# Patient Record
Sex: Female | Born: 1955 | Race: Black or African American | Hispanic: No | Smoking: Never smoker
Health system: Southern US, Community
[De-identification: ages and names within clinical notes are randomized; demographics above are authoritative.]

## PROBLEM LIST (undated history)

## (undated) DIAGNOSIS — E538 Deficiency of other specified B group vitamins: Secondary | ICD-10-CM

## (undated) DIAGNOSIS — G5600 Carpal tunnel syndrome, unspecified upper limb: Secondary | ICD-10-CM

## (undated) DIAGNOSIS — C801 Malignant (primary) neoplasm, unspecified: Secondary | ICD-10-CM

## (undated) DIAGNOSIS — E669 Obesity, unspecified: Secondary | ICD-10-CM

## (undated) DIAGNOSIS — M1711 Unilateral primary osteoarthritis, right knee: Secondary | ICD-10-CM

## (undated) DIAGNOSIS — E039 Hypothyroidism, unspecified: Secondary | ICD-10-CM

## (undated) DIAGNOSIS — E785 Hyperlipidemia, unspecified: Secondary | ICD-10-CM

## (undated) DIAGNOSIS — E119 Type 2 diabetes mellitus without complications: Secondary | ICD-10-CM

## (undated) DIAGNOSIS — R17 Unspecified jaundice: Secondary | ICD-10-CM

## (undated) DIAGNOSIS — I10 Essential (primary) hypertension: Secondary | ICD-10-CM

## (undated) DIAGNOSIS — M13139 Monoarthritis, not elsewhere classified, unspecified wrist: Secondary | ICD-10-CM

## (undated) HISTORY — DX: Monoarthritis, not elsewhere classified, unspecified wrist: M13.139

## (undated) HISTORY — PX: CHOLECYSTECTOMY: SHX55

## (undated) HISTORY — DX: Hyperlipidemia, unspecified: E78.5

## (undated) HISTORY — DX: Obesity, unspecified: E66.9

## (undated) HISTORY — DX: Carpal tunnel syndrome, unspecified upper limb: G56.00

## (undated) HISTORY — PX: TEAR DUCT PROBING: SHX793

## (undated) HISTORY — DX: Deficiency of other specified B group vitamins: E53.8

## (undated) HISTORY — DX: Unspecified jaundice: R17

## (undated) HISTORY — PX: WISDOM TOOTH EXTRACTION: SHX21

## (undated) HISTORY — PX: CARDIAC CATHETERIZATION: SHX172

## (undated) HISTORY — DX: Hypothyroidism, unspecified: E03.9

## (undated) HISTORY — DX: Essential (primary) hypertension: I10

---

## 1999-11-24 ENCOUNTER — Encounter: Admission: RE | Admit: 1999-11-24 | Discharge: 1999-12-08 | Payer: Self-pay | Admitting: Internal Medicine

## 1999-11-25 ENCOUNTER — Encounter: Payer: Self-pay | Admitting: Internal Medicine

## 1999-11-25 ENCOUNTER — Encounter: Admission: RE | Admit: 1999-11-25 | Discharge: 1999-11-25 | Payer: Self-pay | Admitting: Internal Medicine

## 1999-12-14 ENCOUNTER — Encounter: Payer: Self-pay | Admitting: Neurosurgery

## 1999-12-14 ENCOUNTER — Ambulatory Visit (HOSPITAL_COMMUNITY): Admission: RE | Admit: 1999-12-14 | Discharge: 1999-12-14 | Payer: Self-pay | Admitting: Neurosurgery

## 2000-01-02 ENCOUNTER — Encounter: Payer: Self-pay | Admitting: Neurosurgery

## 2000-01-02 ENCOUNTER — Ambulatory Visit (HOSPITAL_COMMUNITY): Admission: RE | Admit: 2000-01-02 | Discharge: 2000-01-02 | Payer: Self-pay | Admitting: Neurosurgery

## 2000-01-16 ENCOUNTER — Ambulatory Visit (HOSPITAL_COMMUNITY): Admission: RE | Admit: 2000-01-16 | Discharge: 2000-01-16 | Payer: Self-pay | Admitting: Neurosurgery

## 2000-01-16 ENCOUNTER — Encounter: Payer: Self-pay | Admitting: Neurosurgery

## 2000-10-24 ENCOUNTER — Encounter: Admission: RE | Admit: 2000-10-24 | Discharge: 2001-01-22 | Payer: Self-pay | Admitting: Orthopedic Surgery

## 2001-03-19 ENCOUNTER — Encounter: Payer: Self-pay | Admitting: Internal Medicine

## 2001-03-19 ENCOUNTER — Encounter: Admission: RE | Admit: 2001-03-19 | Discharge: 2001-03-19 | Payer: Self-pay | Admitting: Internal Medicine

## 2002-06-02 ENCOUNTER — Encounter: Payer: Self-pay | Admitting: Orthopedic Surgery

## 2002-06-02 ENCOUNTER — Encounter: Admission: RE | Admit: 2002-06-02 | Discharge: 2002-06-02 | Payer: Self-pay | Admitting: Orthopedic Surgery

## 2002-06-16 ENCOUNTER — Encounter: Admission: RE | Admit: 2002-06-16 | Discharge: 2002-06-16 | Payer: Self-pay | Admitting: Orthopedic Surgery

## 2002-06-16 ENCOUNTER — Encounter: Payer: Self-pay | Admitting: Orthopedic Surgery

## 2003-08-12 ENCOUNTER — Encounter: Admission: RE | Admit: 2003-08-12 | Discharge: 2003-08-12 | Payer: Self-pay | Admitting: Internal Medicine

## 2004-08-29 ENCOUNTER — Other Ambulatory Visit: Admission: RE | Admit: 2004-08-29 | Discharge: 2004-08-29 | Payer: Self-pay | Admitting: Internal Medicine

## 2005-01-11 ENCOUNTER — Encounter: Admission: RE | Admit: 2005-01-11 | Discharge: 2005-01-11 | Payer: Self-pay | Admitting: Internal Medicine

## 2005-08-24 ENCOUNTER — Encounter: Payer: Self-pay | Admitting: Family Medicine

## 2005-12-28 ENCOUNTER — Encounter: Admission: RE | Admit: 2005-12-28 | Discharge: 2005-12-28 | Payer: Self-pay | Admitting: Interventional Cardiology

## 2006-01-01 ENCOUNTER — Inpatient Hospital Stay (HOSPITAL_BASED_OUTPATIENT_CLINIC_OR_DEPARTMENT_OTHER): Admission: RE | Admit: 2006-01-01 | Discharge: 2006-01-01 | Payer: Self-pay | Admitting: Interventional Cardiology

## 2006-07-03 ENCOUNTER — Ambulatory Visit: Payer: Self-pay | Admitting: Family Medicine

## 2006-08-21 ENCOUNTER — Ambulatory Visit: Payer: Self-pay | Admitting: Family Medicine

## 2006-08-21 LAB — CONVERTED CEMR LAB: Vitamin B-12: 193 pg/mL — ABNORMAL LOW (ref 211–911)

## 2006-09-04 ENCOUNTER — Ambulatory Visit: Payer: Self-pay | Admitting: Family Medicine

## 2006-10-05 ENCOUNTER — Ambulatory Visit: Payer: Self-pay | Admitting: Family Medicine

## 2006-11-05 ENCOUNTER — Encounter: Payer: Self-pay | Admitting: Family Medicine

## 2006-11-05 DIAGNOSIS — G5600 Carpal tunnel syndrome, unspecified upper limb: Secondary | ICD-10-CM

## 2006-11-05 DIAGNOSIS — E669 Obesity, unspecified: Secondary | ICD-10-CM | POA: Insufficient documentation

## 2006-11-05 DIAGNOSIS — E538 Deficiency of other specified B group vitamins: Secondary | ICD-10-CM | POA: Insufficient documentation

## 2006-11-05 DIAGNOSIS — I10 Essential (primary) hypertension: Secondary | ICD-10-CM

## 2006-11-05 DIAGNOSIS — E039 Hypothyroidism, unspecified: Secondary | ICD-10-CM | POA: Insufficient documentation

## 2006-11-05 DIAGNOSIS — IMO0002 Reserved for concepts with insufficient information to code with codable children: Secondary | ICD-10-CM

## 2006-11-05 DIAGNOSIS — M1710 Unilateral primary osteoarthritis, unspecified knee: Secondary | ICD-10-CM | POA: Insufficient documentation

## 2006-11-06 ENCOUNTER — Ambulatory Visit: Payer: Self-pay | Admitting: Family Medicine

## 2006-12-07 ENCOUNTER — Ambulatory Visit: Payer: Self-pay | Admitting: Family Medicine

## 2007-01-21 ENCOUNTER — Ambulatory Visit: Payer: Self-pay | Admitting: Family Medicine

## 2007-04-12 ENCOUNTER — Ambulatory Visit: Payer: Self-pay | Admitting: Family Medicine

## 2007-08-16 ENCOUNTER — Encounter: Payer: Self-pay | Admitting: Family Medicine

## 2007-08-16 ENCOUNTER — Ambulatory Visit: Payer: Self-pay | Admitting: Family Medicine

## 2007-09-09 ENCOUNTER — Encounter: Payer: Self-pay | Admitting: Family Medicine

## 2007-09-09 ENCOUNTER — Other Ambulatory Visit: Admission: RE | Admit: 2007-09-09 | Discharge: 2007-09-09 | Payer: Self-pay | Admitting: Family Medicine

## 2007-09-09 ENCOUNTER — Ambulatory Visit: Payer: Self-pay | Admitting: Family Medicine

## 2007-09-09 DIAGNOSIS — R109 Unspecified abdominal pain: Secondary | ICD-10-CM

## 2007-09-09 LAB — CONVERTED CEMR LAB
Bilirubin Urine: NEGATIVE
Blood in Urine, dipstick: NEGATIVE
Glucose, Urine, Semiquant: NEGATIVE
Ketones, urine, test strip: NEGATIVE
Nitrite: NEGATIVE
WBC Urine, dipstick: NEGATIVE

## 2007-09-10 ENCOUNTER — Telehealth: Payer: Self-pay | Admitting: Family Medicine

## 2007-09-11 ENCOUNTER — Ambulatory Visit: Payer: Self-pay | Admitting: Family Medicine

## 2007-09-12 ENCOUNTER — Encounter: Payer: Self-pay | Admitting: Family Medicine

## 2007-09-16 DIAGNOSIS — N83209 Unspecified ovarian cyst, unspecified side: Secondary | ICD-10-CM

## 2007-09-16 LAB — CONVERTED CEMR LAB
Albumin: 3.8 g/dL (ref 3.5–5.2)
CO2: 29 meq/L (ref 19–32)
Calcium: 9.4 mg/dL (ref 8.4–10.5)
Creatinine, Ser: 0.8 mg/dL (ref 0.4–1.2)
HDL: 45.8 mg/dL (ref >39.0)
Potassium: 4.2 meq/L (ref 3.5–5.1)
Sodium: 140 meq/L (ref 135–145)
Total CHOL/HDL Ratio: 3.8
VLDL: 17 mg/dL (ref 0–40)
Vitamin B-12: 232 pg/mL (ref 211–911)

## 2007-09-17 ENCOUNTER — Encounter (INDEPENDENT_AMBULATORY_CARE_PROVIDER_SITE_OTHER): Payer: Self-pay | Admitting: *Deleted

## 2007-10-23 ENCOUNTER — Ambulatory Visit: Payer: Self-pay | Admitting: Family Medicine

## 2007-11-11 ENCOUNTER — Encounter: Payer: Self-pay | Admitting: Family Medicine

## 2008-12-02 ENCOUNTER — Ambulatory Visit: Payer: Self-pay | Admitting: Family Medicine

## 2008-12-02 ENCOUNTER — Other Ambulatory Visit: Admission: RE | Admit: 2008-12-02 | Discharge: 2008-12-02 | Payer: Self-pay | Admitting: Family Medicine

## 2008-12-02 ENCOUNTER — Encounter: Payer: Self-pay | Admitting: Family Medicine

## 2008-12-02 DIAGNOSIS — G589 Mononeuropathy, unspecified: Secondary | ICD-10-CM | POA: Insufficient documentation

## 2008-12-03 LAB — CONVERTED CEMR LAB
ALT: 15 units/L (ref 0–35)
Albumin: 4 g/dL (ref 3.5–5.2)
BUN: 12 mg/dL (ref 6–23)
Bilirubin, Direct: 0.1 mg/dL (ref 0.0–0.3)
CO2: 29 meq/L (ref 19–32)
Cholesterol: 164 mg/dL (ref 0–200)
GFR calc non Af Amer: 79.26 mL/min (ref >60)
HDL: 44.5 mg/dL (ref >39.00)
TSH: 7.12 microintl units/mL — ABNORMAL HIGH (ref 0.35–5.50)
VLDL: 16.4 mg/dL (ref 0.0–40.0)

## 2008-12-05 ENCOUNTER — Encounter: Payer: Self-pay | Admitting: Family Medicine

## 2008-12-09 ENCOUNTER — Encounter (INDEPENDENT_AMBULATORY_CARE_PROVIDER_SITE_OTHER): Payer: Self-pay | Admitting: *Deleted

## 2009-01-06 ENCOUNTER — Ambulatory Visit: Payer: Self-pay | Admitting: Family Medicine

## 2009-01-06 LAB — CONVERTED CEMR LAB: TSH: 0.18 microintl units/mL — ABNORMAL LOW (ref 0.35–5.50)

## 2009-02-19 ENCOUNTER — Ambulatory Visit: Payer: Self-pay | Admitting: Family Medicine

## 2009-02-23 LAB — CONVERTED CEMR LAB: TSH: 0.34 microintl units/mL — ABNORMAL LOW (ref 0.35–5.50)

## 2009-06-03 ENCOUNTER — Ambulatory Visit: Payer: Self-pay | Admitting: Family Medicine

## 2009-06-03 DIAGNOSIS — R0989 Other specified symptoms and signs involving the circulatory and respiratory systems: Secondary | ICD-10-CM

## 2009-06-03 DIAGNOSIS — R0609 Other forms of dyspnea: Secondary | ICD-10-CM | POA: Insufficient documentation

## 2009-08-30 ENCOUNTER — Encounter (INDEPENDENT_AMBULATORY_CARE_PROVIDER_SITE_OTHER): Payer: Self-pay | Admitting: *Deleted

## 2009-10-26 ENCOUNTER — Ambulatory Visit: Payer: Self-pay | Admitting: Family Medicine

## 2009-10-26 LAB — CONVERTED CEMR LAB
Cholesterol: 177 mg/dL (ref 0–200)
HDL: 44.6 mg/dL (ref >39.00)
LDL Cholesterol: 114 mg/dL — ABNORMAL HIGH (ref 0–99)
Total CHOL/HDL Ratio: 4
Triglycerides: 90 mg/dL (ref 0.0–149.0)
VLDL: 18 mg/dL (ref 0.0–40.0)

## 2009-11-02 ENCOUNTER — Ambulatory Visit: Payer: Self-pay | Admitting: Family Medicine

## 2009-12-03 ENCOUNTER — Ambulatory Visit: Payer: Self-pay | Admitting: Family Medicine

## 2010-01-04 ENCOUNTER — Ambulatory Visit: Payer: Self-pay | Admitting: Family Medicine

## 2010-01-25 ENCOUNTER — Telehealth (INDEPENDENT_AMBULATORY_CARE_PROVIDER_SITE_OTHER): Payer: Self-pay | Admitting: *Deleted

## 2010-01-26 ENCOUNTER — Ambulatory Visit: Payer: Self-pay | Admitting: Family Medicine

## 2010-01-26 DIAGNOSIS — R5381 Other malaise: Secondary | ICD-10-CM

## 2010-01-26 DIAGNOSIS — R5383 Other fatigue: Secondary | ICD-10-CM

## 2010-01-27 LAB — CONVERTED CEMR LAB
ALT: 14 units/L (ref 0–35)
AST: 19 units/L (ref 0–37)
BUN: 10 mg/dL (ref 6–23)
CO2: 30 meq/L (ref 19–32)
Calcium: 9.1 mg/dL (ref 8.4–10.5)
Chloride: 102 meq/L (ref 96–112)
Potassium: 3.7 meq/L (ref 3.5–5.1)
Sodium: 141 meq/L (ref 135–145)
TSH: 0.73 microintl units/mL (ref 0.35–5.50)
Total Bilirubin: 0.6 mg/dL (ref 0.3–1.2)

## 2010-02-07 ENCOUNTER — Encounter: Payer: Self-pay | Admitting: Family Medicine

## 2010-02-08 ENCOUNTER — Ambulatory Visit: Payer: Self-pay | Admitting: Family Medicine

## 2010-02-08 DIAGNOSIS — M19049 Primary osteoarthritis, unspecified hand: Secondary | ICD-10-CM | POA: Insufficient documentation

## 2010-02-08 LAB — HM PAP SMEAR

## 2010-02-08 LAB — CONVERTED CEMR LAB

## 2010-02-22 ENCOUNTER — Encounter: Payer: Self-pay | Admitting: Family Medicine

## 2010-03-16 ENCOUNTER — Ambulatory Visit: Payer: Self-pay | Admitting: Family Medicine

## 2010-04-15 ENCOUNTER — Ambulatory Visit: Payer: Self-pay | Admitting: Family Medicine

## 2010-05-17 ENCOUNTER — Ambulatory Visit: Payer: Self-pay | Admitting: Family Medicine

## 2010-06-16 ENCOUNTER — Ambulatory Visit: Payer: Self-pay | Admitting: Family Medicine

## 2010-07-17 ENCOUNTER — Encounter: Payer: Self-pay | Admitting: Interventional Cardiology

## 2010-07-19 ENCOUNTER — Ambulatory Visit: Admit: 2010-07-19 | Payer: Self-pay | Admitting: Family Medicine

## 2010-07-20 ENCOUNTER — Ambulatory Visit
Admission: RE | Admit: 2010-07-20 | Discharge: 2010-07-20 | Payer: Self-pay | Source: Home / Self Care | Attending: Family Medicine | Admitting: Family Medicine

## 2010-07-26 NOTE — Assessment & Plan Note (Signed)
Summary: RESCH FROM 02-03-2010 SP W/ PT CYD   Vital Signs:  Patient profile:   55 year old female Height:      62.25 inches Weight:      172.8 pounds BMI:     31.47 Temp:     98.5 degrees F oral Pulse rate:   76 / minute Pulse rhythm:   regular BP sitting:   122 / 74  (left arm) Cuff size:   regular  Vitals Entered By: Benny Lennert CMA Duncan Dull) (February 08, 2010 8:35 AM)  History of Present Illness: Chief complaint cpx  The patient is here for annual wellness exam and preventative care.     She also has the additional acute and chronic medical issues.  Fatigue: Neg lab work up....hx of B12 deficiency..in nml range on B12 monthly injections.  Snores at night.Hiram Gash feels like she stops breathing at night.  Sleeping well at night. Denies depression.  Neuropathy...ongoing 1 year. Conttnues to have B numbness toes in  B feet.Marland Kitchenat tips.  Right much more significant than left. No symptoms on sole of foot.   No burning or true pain. But both feet itch. Has never seen neuro regarding this issue No improvement with B12 injections. Told she had slipped disc  in right lumbar spine  7-8 years ago..no current back pain, intermittantly.  Stiffness in right hand...deformity and locking per pt  in PIP and DIP joints. Some redness, but no swelling. Tylenol OTC does not help much.  No history PUD, GI bleed.  Hypothyroidism, stable on current med dose...levoxyl alternating.  High BP, well controlled on toprol and HCTZ.  Cholesterol at goal when last check in 10/2009. LDL goal <130.  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 Take 1 Tablet By Mouth Once A Day 2)  Toprol Xl 25 Mg Tb24 (Metoprolol Succinate) .... Take 1 Tablet By Mouth Once A Day 3)  Levoxyl 112 Mcg Tabs (Levothyroxine Sodium) .... Take 1 Tablet By Mouth Every Other Day, Alternating With 100 Micrograms 4)  Betamethasone Dipropionate 0.05 % Oint (Betamethasone Dipropionate) .... Aaa  Two Times A Day 5)  Multivitamins  Tabs (Multiple Vitamin) .Marland Kitchen.. 1 By Mouth Daily 6)  Calcarb 600/d 600-125 Mg-Unit Tabs (Calcium-Vitamin D) .Marland Kitchen.. 1 By Mouth Daily 7)  Aspirin 81 Mg Tbec (Aspirin) .Marland Kitchen.. 1 By Mouth Daily 8)  Cyanocobalamin 1000 Mcg/ml Soln (Cyanocobalamin) .Marland Kitchen.. 1 Im Monthly 9)  Folic Acid 400 Mcg Tabs (Folic Acid) .Marland Kitchen.. 1 By Mouth Daily 10)  Levoxyl 100 Mcg Tabs (Levothyroxine Sodium) .Marland Kitchen.. 1 Tab By Mouth Every Other Day 11)  Levoxyl 112 Mcg Tabs (Levothyroxine Sodium) .... Take One Every Other Day  Allergies (verified): No Known Drug Allergies  Past History:  Past medical, surgical, family and social histories (including risk factors) reviewed, and no changes noted (except as noted below).  Past Medical History: Reviewed history from 09/09/2007 and  no changes required. VITAMIN B12 DEFICIENCY (ICD-266.2) OBESITY (ICD-278.00) ICTERUS SINCE BIRTH (ICD-782.4) CARPAL TUNNEL SYNDROME, BILATERAL (ICD-354.0) ARTHRITIS, KNEE (ICD-716.96) HYPOTHYROIDISM (ICD-244.9) HYPERTENSION (ICD-401.9)  Past Surgical History: Reviewed history from 09/09/2007 and no changes required. 1980's cholecystectomy             C-sections 2007 Stress test abnormal due to artifact 12/2005 Cardiac cath (-)  Family History: Reviewed history from 11/05/2006 and no changes  required. Father: Alive 1, HTN Mother: Alive 28, DM Siblings: 4 brothers, high chol. 1 sister No MI <65 Colon CA: ? unclear type of CA  Social History: Reviewed history from 11/05/2006 and no changes required. Marital Status: Widowed x 2 years Children: 2 healthy Occupation: Futures trader, disabled due to knee arthritis Exercise:  Some occasional walking Diet:  + more junk food, + fruits and veggies Never Smoked Alcohol use-no Drug use-no  Review of Systems General:  Complains of fatigue; denies fever. CV:  Denies chest pain or discomfort. Resp:  Complains of shortness of breath; Stable SOB with exertion.  Rare exercise. Marland Kitchen GI:  Denies abdominal pain, bloody stools, constipation, and diarrhea. GU:  Denies dysuria.  Physical Exam  General:  overweight appearing female in NAD  Ears:  External ear exam shows no significant lesions or deformities.  Otoscopic examination reveals clear canals, tympanic membranes are intact bilaterally without bulging, retraction, inflammation or discharge. Hearing is grossly normal bilaterally. Nose:  External nasal examination shows no deformity or inflammation. Nasal mucosa are pink and moist without lesions or exudates. Mouth:  Oral mucosa and oropharynx without lesions or exudates.  Teeth in good repair. Neck:  no carotid bruit or thyromegaly no cervical or supraclavicular lymphadenopathy  Chest Wall:  No deformities, masses, or tenderness noted. Breasts:  No mass, nodules, thickening, tenderness, bulging, retraction, inflamation, nipple discharge or skin changes noted.   Lungs:  Normal respiratory effort, chest expands symmetrically. Lungs are clear to auscultation, no crackles or wheezes. Heart:  Normal rate and regular rhythm. S1 and S2 normal without gallop, murmur, click, rub or other extra sounds. Abdomen:  Bowel sounds positive,abdomen soft and non-tender without masses, organomegaly or hernias noted. Genitalia:  normal introitus, no vaginal  discharge, normal uterus size and position, and no adnexal masses or tenderness.    NO PAP done Msk:  No deformity or scoliosis noted of thoracic or lumbar spine.   Pulses:  R and L posterior tibial pulses are full and equal bilaterally  Extremities:  NO edema  right hand deformity in DIP and PIP joints, no MCP swelling , redness or pain Neurologic:  alert & oriented X3, cranial nerves II-XII intact, sensation intact to light touch, sensation intact to pinprick, gait normal, and finger-to-nose normal.     Impression & Recommendations:  Problem # 1:  PHYSICAL EXAMINATION (ICD-V70.0) The patient's preventative maintenance and recommended screening tests for an annual wellness exam were reviewed in full today. Brought up to date unless services declined.  Counselled on the importance of diet, exercise, and its role in overall health and mortality. The patient's FH and SH was reviewed, including their home life, tobacco status, and drug and alcohol status.     Problem # 2:  OTHER MALAISE AND FATIGUE (ICD-780.79) Move forward with eval for sleep apnea given..snoring,chronic fatigue and occ ? apnea spells. Possibly BBlocker SE, but she has been on this longterm.  Orders: Pulmonary Referral (Pulmonary)  Problem # 3:  DYSPNEA ON EXERTION (ICD-786.09)  Her updated medication list for this problem includes:  Hydrochlorothiazide 12.5 Mg Tabs (Hydrochlorothiazide) .Marland Kitchen... Take 1 tablet by mouth once a day    Toprol Xl 25 Mg Tb24 (Metoprolol succinate) .Marland Kitchen... Take 1 tablet by mouth once a day  Orders: EKG w/ Interpretation (93000): NSR, no ST changes, no Q, no LVH.  Problem # 4:  NEUROPATHY (ICD-355.9) Send for nerve conduction to eval if  due to peripheral or central source.  Nml thyrpoid, no alcoholism, nml B12 now, no DM.  Problem # 5:  HYPOTHYROIDISM (ICD-244.9) Well controlled. Continue current medication.  Her updated medication list for this problem includes:    Levoxyl 112 Mcg Tabs  (Levothyroxine sodium) .Marland Kitchen... Take 1 tablet by mouth every other day, alternating with 100 micrograms    Levoxyl 100 Mcg Tabs (Levothyroxine sodium) .Marland Kitchen... 1 tab by mouth every other day    Levoxyl 112 Mcg Tabs (Levothyroxine sodium) .Marland Kitchen... Take one every other day  Problem # 6:  HYPERTENSION (ICD-401.9) Well controlled. Continue current medication.  Her updated medication list for this problem includes:    Hydrochlorothiazide 12.5 Mg Tabs (Hydrochlorothiazide) .Marland Kitchen... Take 1 tablet by mouth once a day    Toprol Xl 25 Mg Tb24 (Metoprolol succinate) .Marland Kitchen... Take 1 tablet by mouth once a day  Complete Medication List: 1)  Hydrochlorothiazide 12.5 Mg Tabs (Hydrochlorothiazide) .... Take 1 tablet by mouth once a day 2)  Toprol Xl 25 Mg Tb24 (Metoprolol succinate) .... Take 1 tablet by mouth once a day 3)  Levoxyl 112 Mcg Tabs (Levothyroxine sodium) .... Take 1 tablet by mouth every other day, alternating with 100 micrograms 4)  Betamethasone Dipropionate 0.05 % Oint (Betamethasone dipropionate) .... Aaa  two times a day 5)  Multivitamins Tabs (Multiple vitamin) .Marland Kitchen.. 1 by mouth daily 6)  Calcarb 600/d 600-125 Mg-unit Tabs (Calcium-vitamin d) .Marland Kitchen.. 1 by mouth daily 7)  Aspirin 81 Mg Tbec (Aspirin) .Marland Kitchen.. 1 by mouth daily 8)  Cyanocobalamin 1000 Mcg/ml Soln (Cyanocobalamin) .Marland Kitchen.. 1 im monthly 9)  Folic Acid 400 Mcg Tabs (Folic acid) .Marland Kitchen.. 1 by mouth daily 10)  Levoxyl 100 Mcg Tabs (Levothyroxine sodium) .Marland Kitchen.. 1 tab by mouth every other day 11)  Levoxyl 112 Mcg Tabs (Levothyroxine sodium) .... Take one every other day 12)  Meloxicam 15 Mg Tabs (Meloxicam) .Marland Kitchen.. 1 tab by mouth daily as needed arthritis pain  Other Orders: Vit B12 1000 mcg (J3420) Admin of Therapeutic Inj  intramuscular or subcutaneous (16109) Radiology Referral (Radiology) Misc. Referral (Misc. Ref)  Patient Instructions: 1)  Referral Appointment Information 2)  Day/Date: 3)  Time: 4)  Place/MD: 5)  Address: 6)  Phone/Fax: 7)  Patient  given appointment information. Information/Orders faxed/mailed.  8)  Use meloxicam for hand pain as needed. Prescriptions: MELOXICAM 15 MG TABS (MELOXICAM) 1 tab by mouth daily as needed arthritis pain  #30 x 2   Entered and Authorized by:   Kerby Nora MD   Signed by:   Kerby Nora MD on 02/08/2010   Method used:   Electronically to        [facility] [phone]* (retail)       [address]       [address]       [place], [place]  [address]       Ph: [phone]       Fax: [phone]   RxID:   [id number]   Current Allergies (reviewed today): No known allergies   Last PAP:  NEGATIVE FOR INTRAEPITHELIAL LESIONS OR MALIGNANCY. (12/02/2008 12:00:00 AM) PAP Result Date:  02/08/2010 PAP Result:  DVE nml this year.pap q 2 years PAP Next Due:  1 yr   Medication Administration  Injection # 1:    Medication: Vit B12 1000 mcg    Diagnosis: VITAMIN B12 DEFICIENCY (ICD-266.2)    Route: IM    Site: R deltoid    Exp Date: 05/27/2011    Lot #: [id number]    Mfr: [employer] Regent    Patient tolerated injection without complications    Given by: Benny Lennert CMA (AAMA) (February 08, 2010 9:19 AM)  Orders Added: 1)  EKG w/ Interpretation [93000] 2)  Est. Patient 40-64 years [99396] 3)  Est. Patient Level III [96045] 4)  Vit B12 1000 mcg [J3420] 5)  Admin of Therapeutic Inj  intramuscular or subcutaneous [96372] 6)  Radiology Referral [Radiology] 7)  Pulmonary Referral [Pulmonary] 8)  Misc. Referral [Misc. Ref]   Appended Document: Orders Update    Clinical Lists Changes  Orders: Added new Service order of Est. Patient 40-64 years (40981) - Signed

## 2010-07-26 NOTE — Assessment & Plan Note (Signed)
Summary: Victoria [name] b12/rbh  Nurse Visit   Allergies: No Known Drug Allergies  Medication Administration  Injection # 1:    Medication: Vit B12 1000 mcg    Diagnosis: VITAMIN B12 DEFICIENCY (ICD-266.2)    Route: IM    Site: L deltoid    Exp Date: 05/2011    Lot #: [id number]    Mfr: [employer] Regent    Patient tolerated injection without complications    Given by: Lowella Petties CMA (January 04, 2010 3:17 PM)  Orders Added: 1)  Vit B12 1000 mcg [J3420] 2)  Admin of Therapeutic Inj  intramuscular or subcutaneous [96372]   Medication Administration  Injection # 1:    Medication: Vit B12 1000 mcg    Diagnosis: VITAMIN B12 DEFICIENCY (ICD-266.2)    Route: IM    Site: L deltoid    Exp Date: 05/2011    Lot #: [id number]    Mfr: American Regent    Patient tolerated injection without complications    Given by: Lowella Petties CMA (January 04, 2010 3:17 PM)  Orders Added: 1)  Vit B12 1000 mcg [J3420] 2)  Admin of Therapeutic Inj  intramuscular or subcutaneous [54098]

## 2010-07-26 NOTE — Assessment & Plan Note (Signed)
Summary: 4 M F/U DLO   Vital Signs:  Patient profile:   55 year old female Height:      62.25 inches Weight:      172.6 pounds BMI:     31.43 Temp:     98.3 degrees F oral Pulse rate:   76 / minute Pulse rhythm:   regular BP sitting:   120 / 78  (left arm) Cuff size:   regular  Vitals Entered By: Benny Lennert CMA Duncan Dull) (Nov 02, 2009 9:11 AM)  History of Present Illness: Chief complaint 6 month follow up   Bottom of feet, bilateral..puffy ..occ buring and itching. Notice it most wearing socks or under sheets in bed.  Numbness under 2 and 3rd toes.Marland Kitchenleft foot.  No foot pain No rash, no redness.   Has history of B12 deficiency..now on oral B12. Feels energy was better on B12 shots..was on once as month previously.   Right knee pain..treated by Dr. Charlett Blake.Marland Kitchenlost cartiladge and arthritis.Marland Kitchengetting close to needing knee replacement.  Problems Prior to Update: 1)  Dyspnea On Exertion  (ICD-786.09) 2)  Physical Examination  (ICD-V70.0) 3)  Peripheral Neuropathy  (ICD-356.9) 4)  Ovarian Cyst, Left  (ICD-620.2) 5)  Routine Gynecological Examination  (ICD-V72.31) 6)  Abdominal Pain, Lower  (ICD-789.09) 7)  Unspecified Breast Screening  (ICD-V76.10) 8)  Obesity  (ICD-278.00) 9)  Icterus Since Birth  (ICD-782.4) 10)  Carpal Tunnel Syndrome, Bilateral  (ICD-354.0) 11)  Arthritis, Knee  (ICD-716.96) 12)  Hypothyroidism  (ICD-244.9) 13)  Hypertension  (ICD-401.9) 14)  Vitamin B12 Deficiency  (ICD-266.2)  Current Medications (verified): 1)  Hydrochlorothiazide 12.5 Mg Tabs (Hydrochlorothiazide) .... Take 1 Tablet By Mouth Once A Day 2)  Toprol Xl 25 Mg Tb24 (Metoprolol Succinate) .... Take 1 Tablet By Mouth Once A Day 3)  Levoxyl 112 Mcg Tabs (Levothyroxine Sodium) .... Take 1 Tablet By Mouth Every Other Day, Alternating With 100 Micrograms 4)  Betamethasone Dipropionate 0.05 % Oint (Betamethasone Dipropionate) .... Aaa  Two Times A Day 5)  Multivitamins  Tabs (Multiple Vitamin)  .Marland Kitchen.. 1 By Mouth Daily 6)  Calcarb 600/d 600-125 Mg-Unit Tabs (Calcium-Vitamin D) .Marland Kitchen.. 1 By Mouth Daily 7)  Aspirin 81 Mg Tbec (Aspirin) .Marland Kitchen.. 1 By Mouth Daily 8)  Cyanocobalamin 1000 Mcg/ml Soln (Cyanocobalamin) .Marland Kitchen.. 1 Im Monthly 9)  Folic Acid 400 Mcg Tabs (Folic Acid) .Marland Kitchen.. 1 By Mouth Daily 10)  Levoxyl 100 Mcg Tabs (Levothyroxine Sodium) .Marland Kitchen.. 1 Tab By Mouth Every Other Day 11)  Levoxyl 112 Mcg Tabs (Levothyroxine Sodium) .... Take One Every Other Day  Allergies (verified): No Known Drug Allergies  Past History:  Past medical, surgical, family and social histories (including risk factors) reviewed, and no changes noted (except as noted below).  Past Medical History: Reviewed history from 09/09/2007 and no changes required. VITAMIN B12 DEFICIENCY (ICD-266.2) OBESITY (ICD-278.00) ICTERUS SINCE BIRTH (ICD-782.4) CARPAL TUNNEL SYNDROME, BILATERAL (ICD-354.0) ARTHRITIS, KNEE (ICD-716.96) HYPOTHYROIDISM (ICD-244.9) HYPERTENSION (ICD-401.9)  Past Surgical History: Reviewed history from 09/09/2007 and no changes required. 1980's cholecystectomy             C-sections 2007 Stress test abnormal due to artifact 12/2005 Cardiac cath (-)  Family History: Reviewed history from 11/05/2006 and no changes required. Father: Alive 91, HTN Mother: Alive 76, DM Siblings: 4 brothers, high chol. 1 sister No MI <65 Colon CA: ? unclear type of CA  Social History: Reviewed history from 11/05/2006 and no changes required. Marital Status: Widowed x 2 years Children: 2 healthy Occupation: Futures trader, disabled due  to knee arthritis Exercise:  Some occasional walking Diet:  + more junk food, + fruits and veggies Never Smoked Alcohol use-no Drug use-no  Review of Systems General:  Complains of fatigue; denies fever. CV:  Denies chest pain or discomfort. Resp:  Denies shortness of breath. GI:  Denies abdominal pain, bloody stools, constipation, and diarrhea. GU:  Denies dysuria.  Physical  Exam  General:  obese appearing female in NAD Mouth:  MMM Neck:  no carotid bruit or thyromegaly no cervical or supraclavicular lymphadenopathy  Lungs:  Normal respiratory effort, chest expands symmetrically. Lungs are clear to auscultation, no crackles or wheezes. Heart:  Normal rate and regular rhythm. S1 and S2 normal without gallop, murmur, click, rub or other extra sounds. Abdomen:  Bowel sounds positive,abdomen soft and non-tender without masses, organomegaly or hernias noted. Pulses:  R and L posterior tibial pulses are full and equal bilaterally  Extremities:  no edema Neurologic:  decreased monofilament in B feet and fingertips alert & oriented X3, cranial nerves II-XII intact, strength normal in all extremities, and gait normal.   Skin:  pale skin due to vitiligo Psych:  flat affect   Impression & Recommendations:  Problem # 1:  PERIPHERAL NEUROPATHY (ICD-356.9) No DM in past...likely due to low B12 . See #3.  Due for thyroid reeval in 01/2010  Problem # 2:  HYPERTENSION (ICD-401.9)  Well controlled. Continue current medication.  Her updated medication list for this problem includes:    Hydrochlorothiazide 12.5 Mg Tabs (Hydrochlorothiazide) .Marland Kitchen... Take 1 tablet by mouth once a day    Toprol Xl 25 Mg Tb24 (Metoprolol succinate) .Marland Kitchen... Take 1 tablet by mouth once a day  BP today: 120/78 Prior BP: 120/70 (06/03/2009)  Prior 10 Yr Risk Heart Disease: 11 % (12/02/2008)  Labs Reviewed: K+: 3.7 (12/02/2008) Creat: : 0.8 (12/02/2008)   Chol: 177 (10/26/2009)   HDL: 44.60 (10/26/2009)   LDL: 114 (10/26/2009)   TG: 90.0 (10/26/2009)  Problem # 3:  VITAMIN B12 DEFICIENCY (ICD-266.2) Does better with injection.   Complete Medication List: 1)  Hydrochlorothiazide 12.5 Mg Tabs (Hydrochlorothiazide) .... Take 1 tablet by mouth once a day 2)  Toprol Xl 25 Mg Tb24 (Metoprolol succinate) .... Take 1 tablet by mouth once a day 3)  Levoxyl 112 Mcg Tabs (Levothyroxine sodium) .... Take 1  tablet by mouth every other day, alternating with 100 micrograms 4)  Betamethasone Dipropionate 0.05 % Oint (Betamethasone dipropionate) .... Aaa  two times a day 5)  Multivitamins Tabs (Multiple vitamin) .Marland Kitchen.. 1 by mouth daily 6)  Calcarb 600/d 600-125 Mg-unit Tabs (Calcium-vitamin d) .Marland Kitchen.. 1 by mouth daily 7)  Aspirin 81 Mg Tbec (Aspirin) .Marland Kitchen.. 1 by mouth daily 8)  Cyanocobalamin 1000 Mcg/ml Soln (Cyanocobalamin) .Marland Kitchen.. 1 im monthly 9)  Folic Acid 400 Mcg Tabs (Folic acid) .Marland Kitchen.. 1 by mouth daily 10)  Levoxyl 100 Mcg Tabs (Levothyroxine sodium) .Marland Kitchen.. 1 tab by mouth every other day 11)  Levoxyl 112 Mcg Tabs (Levothyroxine sodium) .... Take one every other day  Patient Instructions: 1)  Return for monthly B12 shots..nurse visit.  2)  Consider glucosamine 500 mg 2-3 times daily for knee pain. 3)  Work on increasing exercise. 4)   Schedule CPX in next 3 months.   Current Allergies (reviewed today): No known allergies    Medication Administration  Injection # 1:    Medication: Vit B12 1000 mcg    Diagnosis: VITAMIN B12 DEFICIENCY (ICD-266.2)  Orders Added: 1)  Est. Patient Level IV [16109]  Appended Document: 4 M F/U DLO    Clinical Lists Changes  Orders: Added new Service order of Vit B12 1000 mcg (W1191) - Signed Added new Service order of Admin of Therapeutic Inj  intramuscular or subcutaneous (47829) - Signed       Medication Administration  Injection # 1:    Medication: Vit B12 1000 mcg    Diagnosis: VITAMIN B12 DEFICIENCY (ICD-266.2)    Route: IM    Site: L deltoid    Exp Date: 02/25/2011    Lot #: [id number]    Mfr: [employer] Regent    Patient tolerated injection without complications    Given by: Benny Lennert CMA Duncan Dull) (Nov 02, 2009 9:47 AM)  Orders Added: 1)  Vit B12 1000 mcg [J3420] 2)  Admin of Therapeutic Inj  intramuscular or subcutaneous [30865]

## 2010-07-26 NOTE — Miscellaneous (Signed)
  Clinical Lists Changes  Medications: Added new medication of LEVOXYL 112 MCG TABS (LEVOTHYROXINE SODIUM) Take one every other day - Signed Rx of LEVOXYL 112 MCG TABS (LEVOTHYROXINE SODIUM) Take one every other day;  #30 x 3;  Signed;  Entered by: Benny Lennert CMA (AAMA);  Authorized by: Kerby Nora MD;  Method used: Electronically to [facility] [address] [phone]*, [address], [address], [place], [place]  [address], Ph: [phone], Fax: [phone] Observations: Added new observation of ALLERGY REV: Done (08/30/2009 12:02)    Prescriptions: LEVOXYL 112 MCG TABS (LEVOTHYROXINE SODIUM) Take one every other day  #30 x 3   Entered by:   Benny Lennert CMA (AAMA)   Authorized by:   Kerby Nora MD   Signed by:   Benny Lennert CMA (AAMA) on 08/30/2009   Method used:   Electronically to        [facility] [address] [phone]* (retail)       [address]       [address]       [place], [place]  [address]       Ph: [phone]       Fax: [phone]   RxID:   [id number]   Prior Medications: HYDROCHLOROTHIAZIDE 12.5 MG TABS (HYDROCHLOROTHIAZIDE) Take 1 tablet by mouth once a day TOPROL XL 25 MG TB24 (METOPROLOL SUCCINATE) Take 1 tablet by mouth once a day LEVOXYL 112 MCG TABS (LEVOTHYROXINE SODIUM) Take 1 tablet by mouth every other day, alternating with 100 micrograms BETAMETHASONE DIPROPIONATE 0.05 % OINT (BETAMETHASONE DIPROPIONATE) AAA  two times a day MULTIVITAMINS  TABS (MULTIPLE VITAMIN) 1 by mouth daily CALCARB 600/D 600-125 MG-UNIT TABS (CALCIUM-VITAMIN D) 1 by mouth daily ASPIRIN 81 MG TBEC (ASPIRIN) 1 by mouth daily CYANOCOBALAMIN 1000 MCG/ML SOLN (CYANOCOBALAMIN) 1 IM monthly FOLIC ACID 400 MCG TABS (FOLIC ACID) 1 by mouth daily LEVOXYL 100 MCG TABS (LEVOTHYROXINE SODIUM) 1 tab by mouth every other day Current Allergies (reviewed today): No known allergies

## 2010-07-26 NOTE — Progress Notes (Signed)
----   Converted from flag ---- ---- 01/25/2010 12:58 PM, Kerby Nora MD wrote: Roland Rack Dx 244.9, B12 Dx 780.79, CMET Dx 401.1 Will not check LIPIDs because well controlle din 10/2009.  ---- 01/25/2010 12:20 PM, Liane Comber CMA (AAMA) wrote: Pt is scheduled for cpx labs tomorrow, what labs to draw and dx codes? Thanks Tasha ------------------------------

## 2010-07-26 NOTE — Assessment & Plan Note (Signed)
Summary: Vashon Arch B12/RBH  Nurse Visit   Allergies: No Known Drug Allergies  Medication Administration  Injection # 1:    Medication: Vit B12 1000 mcg    Diagnosis: VITAMIN B12 DEFICIENCY (ICD-266.2)    Route: IM    Site: R deltoid    Exp Date: 06/26/2011    Lot #: [id number]    Mfr: [employer] Regent    Patient tolerated injection without complications    Given by: Delilah Shan CMA (AAMA) (December 03, 2009 10:28 AM)  Orders Added: 1)  Admin of Therapeutic Inj  intramuscular or subcutaneous [96372] 2)  Vit B12 1000 mcg [J3420]   Medication Administration  Injection # 1:    Medication: Vit B12 1000 mcg    Diagnosis: VITAMIN B12 DEFICIENCY (ICD-266.2)    Route: IM    Site: R deltoid    Exp Date: 06/26/2011    Lot #: [id number]    Mfr: [employer] Regent    Patient tolerated injection without complications    Given by: Delilah Shan CMA (AAMA) (December 03, 2009 10:28 AM)  Orders Added: 1)  Admin of Therapeutic Inj  intramuscular or subcutaneous [96372] 2)  Vit B12 1000 mcg [J3420]

## 2010-07-26 NOTE — Assessment & Plan Note (Signed)
Summary: B-12 SHOT/BEDSOLE/CLE  Nurse Visit   Allergies: No Known Drug Allergies  Medication Administration  Injection # 1:    Medication: Vit B12 1000 mcg    Diagnosis: VITAMIN B12 DEFICIENCY (ICD-266.2)    Route: IM    Site: L deltoid    Exp Date: 10/24/2011    Lot #: [id number]    Mfr: [employer]    Patient tolerated injection without complications    Given by: Delilah Shan CMA Duncan Dull) (March 16, 2010 9:59 AM)  Orders Added: 1)  Admin of Therapeutic Inj  intramuscular or subcutaneous [96372] 2)  Vit B12 1000 mcg [J3420]   Medication Administration  Injection # 1:    Medication: Vit B12 1000 mcg    Diagnosis: VITAMIN B12 DEFICIENCY (ICD-266.2)    Route: IM    Site: L deltoid    Exp Date: 10/24/2011    Lot #: [id number]    Mfr: [employer]    Patient tolerated injection without complications    Given by: Delilah Shan CMA (AAMA) (March 16, 2010 9:59 AM)  Orders Added: 1)  Admin of Therapeutic Inj  intramuscular or subcutaneous [96372] 2)  Vit B12 1000 mcg [J3420]

## 2010-07-26 NOTE — Assessment & Plan Note (Signed)
Summary: BEDSOLE B12/RBH  Nurse Visit   Allergies: No Known Drug Allergies  Medication Administration  Injection # 1:    Medication: Vit B12 1000 mcg    Diagnosis: VITAMIN B12 DEFICIENCY (ICD-266.2)    Route: IM    Site: L deltoid    Exp Date: 10/25/2011    Lot #: [id number]    Mfr: [employer]    Patient tolerated injection without complications    Given by: Lewanda Rife LPN (May 17, 2010 10:13 AM)  Orders Added: 1)  Vit B12 1000 mcg [J3420] 2)  Admin of Therapeutic Inj  intramuscular or subcutaneous [[id number]]

## 2010-07-26 NOTE — Assessment & Plan Note (Signed)
Summary: B-12/BEDSOLE/JRR  Nurse Visit   Allergies: No Known Drug Allergies  Medication Administration  Injection # 1:    Medication: Vit B12 1000 mcg    Diagnosis: VITAMIN B12 DEFICIENCY (ICD-266.2)    Route: IM    Site: R deltoid    Exp Date: 10/24/2011    Lot #: [id number]    Mfr: [employer]    Patient tolerated injection without complications    Given by: Delilah Shan CMA (AAMA) (April 15, 2010 10:03 AM)  Orders Added: 1)  Admin of Therapeutic Inj  intramuscular or subcutaneous [96372] 2)  Vit B12 1000 mcg [J3420]   Medication Administration  Injection # 1:    Medication: Vit B12 1000 mcg    Diagnosis: VITAMIN B12 DEFICIENCY (ICD-266.2)    Route: IM    Site: R deltoid    Exp Date: 10/24/2011    Lot #: [id number]    Mfr: [employer]    Patient tolerated injection without complications    Given by: Delilah Shan CMA (AAMA) (April 15, 2010 10:03 AM)  Orders Added: 1)  Admin of Therapeutic Inj  intramuscular or subcutaneous [96372] 2)  Vit B12 1000 mcg [J3420]

## 2010-07-28 NOTE — Assessment & Plan Note (Signed)
Summary: B-12 SHOT/CLE  Nurse Visit   Allergies: No Known Drug Allergies  Medication Administration  Injection # 1:    Medication: Vit B12 1000 mcg    Diagnosis: VITAMIN B12 DEFICIENCY (ICD-266.2)    Route: IM    Site: L deltoid    Exp Date: 09/25/2011    Lot #: [id number]    Mfr: [employer]    Patient tolerated injection without complications    Given by: Linde Gillis CMA Duncan Dull) (July 20, 2010 3:04 PM)  Orders Added: 1)  Vit B12 1000 mcg [J3420] 2)  Admin of Therapeutic Inj  intramuscular or subcutaneous [[id number]]

## 2010-07-28 NOTE — Assessment & Plan Note (Signed)
Summary: B-12/Victoria [name]/JRR  Nurse Visit   Allergies: No Known Drug Allergies  Medication Administration  Injection # 1:    Medication: Vit B12 1000 mcg    Diagnosis: VITAMIN B12 DEFICIENCY (ICD-266.2)    Route: IM    Site: R deltoid    Exp Date: 12/25/2011    Lot #: [id number]    Mfr: [employer]    Patient tolerated injection without complications    Given by: Linde Gillis CMA Duncan Dull) (June 16, 2010 9:59 AM)  Orders Added: 1)  Vit B12 1000 mcg [J3420] 2)  Admin of Therapeutic Inj  intramuscular or subcutaneous [[id number]]

## 2010-08-19 ENCOUNTER — Encounter: Payer: Self-pay | Admitting: Family Medicine

## 2010-08-19 ENCOUNTER — Ambulatory Visit: Payer: Medicare HMO | Admitting: Family Medicine

## 2010-08-19 ENCOUNTER — Ambulatory Visit (INDEPENDENT_AMBULATORY_CARE_PROVIDER_SITE_OTHER): Payer: Medicare HMO

## 2010-08-19 DIAGNOSIS — E538 Deficiency of other specified B group vitamins: Secondary | ICD-10-CM

## 2010-08-23 NOTE — Assessment & Plan Note (Signed)
Summary: b12  Nurse Visit   Allergies: No Known Drug Allergies  Medication Administration  Injection # 1:    Medication: Vit B12 1000 mcg    Diagnosis: VITAMIN B12 DEFICIENCY (ICD-266.2)    Route: IM    Site: R deltoid    Exp Date: 07/06/2011    Lot #: [id number]    Mfr: [employer]    Given by: Melody Comas (August 19, 2010 5:06 PM)  Orders Added: 1)  Admin of Therapeutic Inj  intramuscular or subcutaneous [96372] 2)  Vit B12 1000 mcg [J3420]

## 2010-10-04 ENCOUNTER — Ambulatory Visit (INDEPENDENT_AMBULATORY_CARE_PROVIDER_SITE_OTHER): Payer: Medicare HMO | Admitting: Family Medicine

## 2010-10-04 DIAGNOSIS — E538 Deficiency of other specified B group vitamins: Secondary | ICD-10-CM

## 2010-10-04 MED ORDER — CYANOCOBALAMIN 1000 MCG/ML IJ SOLN
1000.0000 ug | INTRAMUSCULAR | Status: DC
  Administered 2010-10-04 – 2011-06-23 (×4): 1000 ug via INTRAMUSCULAR

## 2010-10-04 NOTE — Progress Notes (Signed)
  Subjective:    Patient ID: Victoria [name], female    DOB: 1955/09/23, 55 y.o.   MRN: [id number]  HPI Nurse visit   Review of Systems     Objective:   Physical Exam        Assessment & Plan:

## 2010-10-06 ENCOUNTER — Other Ambulatory Visit: Payer: Self-pay | Admitting: Family Medicine

## 2010-10-11 ENCOUNTER — Ambulatory Visit: Payer: Medicare HMO

## 2010-10-26 ENCOUNTER — Encounter: Payer: Self-pay | Admitting: *Deleted

## 2010-10-27 ENCOUNTER — Ambulatory Visit (INDEPENDENT_AMBULATORY_CARE_PROVIDER_SITE_OTHER): Payer: Medicare HMO | Admitting: Family Medicine

## 2010-10-27 ENCOUNTER — Encounter: Payer: Self-pay | Admitting: Family Medicine

## 2010-10-27 VITALS — BP 130/84 | HR 79 | Temp 98.5°F | Ht 62.0 in | Wt 179.8 lb

## 2010-10-27 DIAGNOSIS — S239XXA Sprain of unspecified parts of thorax, initial encounter: Secondary | ICD-10-CM

## 2010-10-27 DIAGNOSIS — M62838 Other muscle spasm: Secondary | ICD-10-CM

## 2010-10-27 DIAGNOSIS — S238XXA Sprain of other specified parts of thorax, initial encounter: Secondary | ICD-10-CM

## 2010-10-27 MED ORDER — CYCLOBENZAPRINE HCL 10 MG PO TABS: 10.0000 mg | ORAL_TABLET | Freq: Three times a day (TID) | ORAL | Status: DC | PRN

## 2010-10-27 NOTE — Progress Notes (Signed)
55 year old female:  Worked in her yard last week. Tuesday started to feel bad. Had a small bruise on her back. Does not remember hitting anything.  Started on Tuesday night. Started to hurt so bad. Feeling a little better. When pulling her arm back it will hurt a little better.   Primarily, the patient complains of mid back pain, and around the thoracic region and associated with certain shoulder blade motions. She has not had any falls. She does note that one small bruise. Ongoing for about one week. No low back pain. No neck pain. No radiculopathy or numbness.  No significant prior back injuries or associated traumatic fractures or operative interventions.  The PMH, PSH, Social History, Family History, Medications, and allergies have been reviewed in [facility], and have been updated if relevant.  REVIEW OF SYSTEMS  GEN: No fevers, chills. Nontoxic. Primarily MSK c/o today. MSK: Detailed in the HPI GI: tolerating PO intake without difficulty Neuro: No numbness, parasthesias, or tingling associated. Otherwise the pertinent positives of the ROS are noted above.   GEN: Well-developed,well-nourished,in no acute distress; alert,appropriate and cooperative throughout examination HEENT: Normocephalic and atraumatic without obvious abnormalities. Ears, externally no deformities PULM: Breathing comfortably in no respiratory distress EXT: No clubbing, cyanosis, or edema PSYCH: Normally interactive. Cooperative during the interview. Pleasant. Friendly and conversant. Not anxious or depressed appearing. Normal, full affect.  CERVICAL SPINE EXAM Range of motion: Flexion, extension, lateral bending, and rotation: Full Pain with terminal motion: none Spinous Processes: NT SCM: NT Upper paracervical muscles: NT Upper traps: NT Middle trapezius and rhomboids: Notable tenderness and tenderness adjacent and underneath the parascapular musculature C5-T1 intact, sensation and motor  Assessment and plan:  Rhomboid and trapezius strain. New.  The patient was given a handout on their condition that reviewed the anatomy, relevant pathophysiology and basic rehabilitation protocol. Additional in-office instruction given.  Mobic, continue, flexeril at night, bath, ROM, massage DOI 11/21/2010

## 2010-10-28 ENCOUNTER — Encounter: Payer: Self-pay | Admitting: Family Medicine

## 2010-11-03 ENCOUNTER — Ambulatory Visit (INDEPENDENT_AMBULATORY_CARE_PROVIDER_SITE_OTHER): Payer: Medicare HMO | Admitting: Family Medicine

## 2010-11-03 DIAGNOSIS — E538 Deficiency of other specified B group vitamins: Secondary | ICD-10-CM

## 2010-11-11 NOTE — H&P (Signed)
[name], [name] NO.:  [id number]   MEDICAL RECORD NO.:  [id number]          PATIENT TYPE:  OIB   LOCATION:  1963                         FACILITY:  [facility]   PHYSICIAN:  Lyn Records, M.D.   DATE OF BIRTH:  06/05/1956   DATE OF ADMISSION:  01/01/2006  DATE OF DISCHARGE:                                HISTORY & PHYSICAL   PRECARDIAC CATHETERIZATION WORKUP   PRIMARY CARE PHYSICIAN:  Madison Hickman, M.D.   CARDIOLOGIST:  Verdis Prime, M.D.   CHIEF COMPLAINT:  Chest tightness.   HISTORY OF PRESENT ILLNESS:  Mrs. Victoria [name] is a 55 year old African-American  female with a history of hypertension.  She complained of substernal chest  tightness that onset both at rest and with exertion over a year ago.  Chest  tightness is constant in nature and lasts for a few seconds.  It is rated a  5/10 and does not radiate.  She describes the chest tightness as a dull  ache.  The chest tightness is associated with shortness of breath,  diaphoresis and dizziness; however, the patient denies nausea, vomiting or  syncope.  Her chest tightness is worst after eating a meal and with sitting  versus lying.  She denies any treatment for the chest tightness.  The  patient sought treatment from her primary care physician, Dr. Madison Hickman  and was referred to Dr. Verdis Prime.  Dr. Katrinka Blazing scheduled the patient for an  adenosine Cardiolite, as she was unable to walk on a treadmill.  The results  of the adenosine Cardiolite were abnormal, raising the possibility of both  anterior and inferior ischemia.  The patient is scheduled for an elective  diagnostic outpatient cardiac catheterization today at 9:30 a.m. to rule out  ischemia.  She currently denies chest pain and chest tightness.   PAST MEDICAL HISTORY:  1.  Hypertension.  2.  Hypothyroidism.  3.  Osteoarthritis.  4.  Vitiligo.   ALLERGIES:  NO KNOWN DRUG ALLERGIES.   MEDICATIONS:  1.  Metoprolol ER 25 mg daily.  2.  Levoxyl  100 mcg daily.  3.  HCTZ 12.5 mg daily.  4.  Meloxicam 15 mg daily with food.  5.  Enteric-coated aspirin 81 mg daily.  6.  Betamethasone DP 0.05% OI applied to the affected areas twice daily.  7.  Multivitamin 1 tablet daily.  8.  Calcium with vitamin D 2 tablets daily.   PAST SURGICAL HISTORY:  1.  Status post cesarean sections x2.  2.  Status post cholecystectomy   FAMILY HISTORY:  1.  Father living age 15, hypertension.  2.  Mother age 35 status post cardiac catheterization with unknown results,      diabetes mellitus.  3.  Sister living age 48, healthy.  4.  Brother living age 45, osteoarthritis.  5.  Three brothers living, various ages-healthy.   SOCIAL HISTORY:  Widowed with 2 adult daughters.  The patient lives with her  2 daughters.  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  She denies tobacco, alcohol and illicit drug use.  She  is unable to establish an exercise regimen due to her osteoarthritis.   REVIEW OF SYSTEMS:  All other systems reviewed are negative other than what  is stated in the HPI.   PHYSICAL EXAMINATION:  GENERAL:  A 55 year old female, pleasant and  cooperative.  NAD.  VITAL SIGNS:  Pulse 76, blood pressure 136/76 and O2 saturations 98% over  room air.  HEENT:  Unremarkable.  NECK:  Supple without JVD or carotid bruits bilaterally.  Carotid upstrokes  are 2+.  LUNGS:  Breath sounds are clear to auscultation bilaterally without wheezes,  rhonchi, crackles or rales.  HEART:  Regular rate and rhythm.  S1 and S2 normal without murmurs, gallops,  clicks or rubs.  ABDOMEN:  Soft, nontender and nondistended with active bowel sounds.  No  masses, hepatomegaly or bilateral bruits.  EXTREMITIES:  No peripheral edema.  DP and PT pulses are 2+/2 bilaterally.  SKIN:  Warm and dry without rashes with multiple nevi.  NEURO:  Alert and oriented x3.  No focal deficits.  PSYCH:  Normal mood and affect.   LABORATORY DATA:  Obtained  on December 28, 2005:  White blood count 5.7,  hemoglobin 12.5, hematocrit 37.1 and platelets 298.  Sodium 140, potassium  3.6, chloride 104, CO2 28, BUN 7, creatinine 0.9 and glucose 95.  PT 12.1,  INR 1 and PTT 40.  Chest x-ray on December 28, 2005, stable, likely granuloma at  the retrosternal anterior lung on lateral view-recommend a followup chest x-  ray in 1 year; otherwise, no active disease.   Adenosine Cardiolite results:  1.  This is an abnormal appearing adenosine Cardiolite study, raising the      possibility of both anterior and inferior ischemia.  There is increased      got uptake and breast attenuation that could be causing some apparent      artifact.  2.  Low-normal LV function, EF approximately 50%.   ASSESSMENT AND PLAN:  1.  Chest tightness/chest pain, resolved.  2.  Dyspnea, resolved.  3.  Abnormal adenosine Cardiolite.  4.  Outpatient elective diagnostic cardiac catheterization on January 01, 2006      at 9:30 a.m.  Dr. Katrinka Blazing has explained the cardiac catheterization      procedure, risks and potential complications including MI, CVA, death,      IV contrast      dye allergy, decreased renal function within the first 48 hours post the      cardiac catheterization procedure, vascular complications including AV      fistula, bleeding, hematoma and pseudoaneurysm.  The patient admits to      full understanding of the information and wishes to proceed with the      procedure as scheduled.      [name], [place]      Lyn Records, M.D.  Electronically Signed    RDM/MEDQ  D:  01/01/2006  T:  01/01/2006  Job:  [id number]   cc:   Darius Bump, M.D.  Fax: [phone]

## 2010-11-11 NOTE — Assessment & Plan Note (Signed)
[facility]                           [place] OFFICE NOTE   OLENA, WILLY                          MRN:          [id number]  DATE:07/03/2006                            DOB:          03-20-1956    CHIEF COMPLAINT:  A 55 year old female here to establish new doctor.   HISTORY OF PRESENT ILLNESS:  Ms. Kuna recently saw her previous primary  care doctor, Dr. Daphine Deutscher, in November 2007.  She is changing to a Adult nurse  facility because of insurance issues.  The only concern she has today is  the following, splinter in left hand, fourth digit fingertip.  She  states that this it has been there for several days.  It hurts with  pressure.  She denies any erythema, drainage, fever, chills, nausea,  vomiting.   REVIEW OF SYSTEMS:  No headache, no dizziness.  Her eyes are slightly  yellow and they have always been that color.  She does not wear glasses,  has no problems with hearing, no dyspnea, no chest pain, no nausea,  vomiting, or constipation.   PAST MEDICAL HISTORY:  1. B12 deficiency.  2. Hypertension.  3. Hypothyroidism.  4. Knee arthritis.  5. Bilateral carpal tunnel syndrome.  6. Icterus since birth.   HOSPITALIZATIONS/SURGERIES/PROCEDURES:  1. In 1980, cholecystectomy.  2. Two C-sections.  3. In 2007, stress test negative.  4. Mammogram, April 2007, negative.  5. Pap smear, March 2007, negative.   ALLERGIES:  NONE.   MEDICATIONS:  1. Betamethasone 0.05% applied to effected area b.i.d.  2. Multivitamin daily.  3. Hydrochlorothiazide 12.5 mg daily.  4. Metoprolol 25 mg, one tablet p.o. daily.  5. Mobic 15 mg, one tablet p.o. daily.  6. Capsaicin.  7. Calcium and vitamin D 500 mg, one tablet p.o. daily.  8. Levoxyl 100 mcg one tablet p.o. daily.  9. Aspirin 81 mg daily.  10.B12 shot one time monthly.   SOCIAL HISTORY:  No smoking, no alcohol, no drug use.  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   She is  widowed for the past 2 years.  She has 2 children who are healthy.  She  occasionally walks some but is very much limited by her knees.  She eats  a lot of junk food, although is trying to eat more fruits and  vegetables.   FAMILY HISTORY:  Father alive at age 43 with hypertension.  Mother alive  at age 29 with diabetes.  She has four brothers, one who has high  cholesterol.  And, she has one sister who is healthy.  There is no  family history of MI before age 65.  As far as she knows there is no  family history of any type of cancer, although she has a possibility of  a distant relative with cancer but is unsure what type.   PHYSICAL EXAMINATION:  VITAL SIGNS:  Height 62 and 1/4 inches, weight  184, making BMI 33, blood pressure 136/82, pulse 80, temperature 98.1.  GENERAL:  Overweight-appearing female in no apparent distress.  HEENT:  PERRLA.  Extraocular muscles intact.  Bilateral slight icterus.  Nares clear.  No thyromegaly.  No lymphadenopathy supraclavicular or  cervical.  PULMONARY:  Clear to auscultation bilaterally.  No wheezes, rales, or  rhonchi.  CARDIOVASCULAR:  Regular rate and rhythm.  No murmurs, rubs, or gallops.  Normal PMI.  Peripheral pulses 2+.  No peripheral edema.  ABDOMEN:  Soft, obese, nontender.  Normoactive bowel sounds.  No  hepatosplenomegaly.  MUSCULOSKELETAL:  Strength 5/5 in the upper and lower extremities.  NEUROLOGIC:  Alert and oriented  x3.  Cranial nerves II-XII grossly  intact.   ASSESSMENT/PLAN:  1. Splinter in finger.  Area cleaned with alcohol and splinter removed      with tip of scalpel blade.  No cut made.  Minimal blood loss.  The      patient had normal sensation after removal of splinter.  No current      sign of infection.  The patient may apply a Band-Aid and use      antibiotic ointment over the next few days.  2. Prevention.  She is currently up to date with the majority of her      prevention.  I will obtain records from  her previous doctor to      determine when cholesterol was last evaluated.  She was encouraged      to increase exercise as much as tolerated and to consider beginning      a swim program given her bilateral knee arthritis.  She was also      encouraged to decrease fatty and sweet foods and to work on eating      more fruits and vegetables and lean meats.     Kerby Nora, MD  Electronically Signed    AB/MedQ  DD: 07/04/2006  DT: 07/04/2006  Job #: [id number]

## 2010-11-11 NOTE — Cardiovascular Report (Signed)
[name], [name] NO.:  [id number]   MEDICAL RECORD NO.:  [id number]          PATIENT TYPE:  OIB   LOCATION:  1963                         FACILITY:  [facility]   PHYSICIAN:  Lyn Records, M.D.   DATE OF BIRTH:  1956-03-03   DATE OF PROCEDURE:  01/01/2006  DATE OF DISCHARGE:                              CARDIAC CATHETERIZATION   INDICATIONS FOR PROCEDURE:  The patient has been having exertional shortness  of breath and discomfort occurring at rest and with exertion for over year.  Dr. Daphine Deutscher ordered a pharmacologic nuclear perfusion study to rule out  coronary artery disease in this 55 year old female.  This study demonstrated  possible anterior and inferior ischemia, although it was felt that there was  a possibility that artifact could be causing this findings.  After Dr.  Daphine Deutscher discussed this with the patient, they have decided to proceed with  coronary angiography to define coronary anatomy.   PROCEDURE PERFORMED:  1.  Left heart catheterization.  2.  Selective coronary angiography.  3.  Left ventriculography.   DESCRIPTION:  After informed consent, a 4-French sheath was placed in the  right femoral artery using modified Seldinger technique.  A 4-French A2  multipurpose catheter was then used for hemodynamic recordings, left  ventriculography by hand injection, and selective right coronary  angiography.  A #4 left Judkins catheter was used for left coronary  angiography.  The patient tolerated the procedure without complications.   RESULTS:  1.  Hemodynamic data:      1.  Aortic pressure 159/85.      2.  Left ventricular pressure 152/16.  2.  Left ventriculography:  Left ventricular __________ size and systolic      function are normal.  EF is 65%.  3.  Coronary angiography:  a  Left main coronary artery normal..  b.  Left anterior descending coronary:  LAD is a large vessel that reaches  the left ventricular apex.  Three large diagonal branches  arise from it.  No  significant obstruction is seen.  c.  Circumflex artery:  The circumflex artery is relatively small, giving  origin to one obtuse marginal branch.  No significant obstruction is noted.  d.  Ramus intermedius branch:  A large ramus intermediate branch arises from  the distal left main.  It is a vessel that bifurcates, and it is free of any  significant obstruction.  e.  Right coronary:  Right coronary artery is a large vessel giving the PDA  and two large left ventricular branches.  This vessel is entirely normal.   CONCLUSIONS:  1.  Normal coronary arteries.  2.  Normal LV function.  3.  Abnormal Cardiolite study due to artifact.   RECOMMENDATIONS:  No further cardiac evaluation.  If dyspnea continues, will  consider pulmonary evaluation.      Lyn Records, M.D.  Electronically Signed     HWS/MEDQ  D:  01/01/2006  T:  01/01/2006  Job:  [id number]   cc:   Darius Bump, M.D.  Fax: [phone]

## 2010-11-14 NOTE — Progress Notes (Signed)
B12 given

## 2010-12-06 ENCOUNTER — Ambulatory Visit (INDEPENDENT_AMBULATORY_CARE_PROVIDER_SITE_OTHER): Payer: Medicare HMO | Admitting: Family Medicine

## 2010-12-06 DIAGNOSIS — E538 Deficiency of other specified B group vitamins: Secondary | ICD-10-CM

## 2010-12-06 NOTE — Progress Notes (Signed)
B12 injection given during nurse visit today. 

## 2011-01-05 ENCOUNTER — Ambulatory Visit: Payer: Medicare HMO

## 2011-01-06 ENCOUNTER — Ambulatory Visit (INDEPENDENT_AMBULATORY_CARE_PROVIDER_SITE_OTHER): Payer: Medicare HMO | Admitting: Family Medicine

## 2011-01-06 DIAGNOSIS — E538 Deficiency of other specified B group vitamins: Secondary | ICD-10-CM

## 2011-01-06 MED ORDER — CYANOCOBALAMIN 1000 MCG/ML IJ SOLN
1000.0000 ug | Freq: Once | INTRAMUSCULAR | Status: AC
  Administered 2011-01-06: 1000 ug via INTRAMUSCULAR

## 2011-01-06 NOTE — Progress Notes (Signed)
B12 injection given during nurse visit today. 

## 2011-01-12 ENCOUNTER — Other Ambulatory Visit: Payer: Self-pay | Admitting: Family Medicine

## 2011-02-05 ENCOUNTER — Telehealth: Payer: Self-pay | Admitting: Family Medicine

## 2011-02-05 DIAGNOSIS — E039 Hypothyroidism, unspecified: Secondary | ICD-10-CM

## 2011-02-05 DIAGNOSIS — I10 Essential (primary) hypertension: Secondary | ICD-10-CM

## 2011-02-05 DIAGNOSIS — E538 Deficiency of other specified B group vitamins: Secondary | ICD-10-CM

## 2011-02-05 NOTE — Telephone Encounter (Signed)
Message copied by Excell Seltzer on Sun Feb 05, 2011 11:30 PM ------      Message from: Baldomero Lamy      Created: Wed Feb 01, 2011  8:55 AM      Regarding: cpx labs mon 02/06/11       Please order  future cpx labs for pt's upcomming lab appt.      Thanks      Rodney Booze

## 2011-02-06 ENCOUNTER — Other Ambulatory Visit (INDEPENDENT_AMBULATORY_CARE_PROVIDER_SITE_OTHER): Payer: Medicare HMO

## 2011-02-06 DIAGNOSIS — E039 Hypothyroidism, unspecified: Secondary | ICD-10-CM

## 2011-02-06 DIAGNOSIS — I10 Essential (primary) hypertension: Secondary | ICD-10-CM

## 2011-02-06 DIAGNOSIS — E538 Deficiency of other specified B group vitamins: Secondary | ICD-10-CM

## 2011-02-06 LAB — COMPREHENSIVE METABOLIC PANEL
ALT: 15 U/L (ref 0–35)
AST: 19 U/L (ref 0–37)
Albumin: 4.1 g/dL (ref 3.5–5.2)
Calcium: 9.2 mg/dL (ref 8.4–10.5)
Chloride: 101 mEq/L (ref 96–112)
Potassium: 3.6 mEq/L (ref 3.5–5.1)
Sodium: 139 mEq/L (ref 135–145)

## 2011-02-06 LAB — LIPID PANEL
LDL Cholesterol: 86 mg/dL (ref 0–99)
Total CHOL/HDL Ratio: 3
VLDL: 25 mg/dL (ref 0.0–40.0)

## 2011-02-10 ENCOUNTER — Ambulatory Visit (INDEPENDENT_AMBULATORY_CARE_PROVIDER_SITE_OTHER)
Admission: RE | Admit: 2011-02-10 | Discharge: 2011-02-10 | Disposition: A | Discharge status: Home / Self Care | Payer: Medicare HMO | Source: Ambulatory Visit | Attending: Family Medicine | Admitting: Family Medicine

## 2011-02-10 ENCOUNTER — Other Ambulatory Visit (HOSPITAL_COMMUNITY)
Admission: RE | Admit: 2011-02-10 | Discharge: 2011-02-10 | Disposition: A | Discharge status: Home / Self Care | Payer: Medicare HMO | Source: Ambulatory Visit | Attending: Family Medicine | Admitting: Family Medicine

## 2011-02-10 ENCOUNTER — Encounter: Payer: Self-pay | Admitting: Family Medicine

## 2011-02-10 ENCOUNTER — Ambulatory Visit (INDEPENDENT_AMBULATORY_CARE_PROVIDER_SITE_OTHER): Payer: Medicare HMO | Admitting: Family Medicine

## 2011-02-10 DIAGNOSIS — R0609 Other forms of dyspnea: Secondary | ICD-10-CM

## 2011-02-10 DIAGNOSIS — Z01419 Encounter for gynecological examination (general) (routine) without abnormal findings: Secondary | ICD-10-CM

## 2011-02-10 DIAGNOSIS — G589 Mononeuropathy, unspecified: Secondary | ICD-10-CM

## 2011-02-10 DIAGNOSIS — Z0000 Encounter for general adult medical examination without abnormal findings: Secondary | ICD-10-CM

## 2011-02-10 DIAGNOSIS — I10 Essential (primary) hypertension: Secondary | ICD-10-CM

## 2011-02-10 DIAGNOSIS — Z124 Encounter for screening for malignant neoplasm of cervix: Secondary | ICD-10-CM | POA: Insufficient documentation

## 2011-02-10 DIAGNOSIS — E039 Hypothyroidism, unspecified: Secondary | ICD-10-CM

## 2011-02-10 DIAGNOSIS — Z136 Encounter for screening for cardiovascular disorders: Secondary | ICD-10-CM

## 2011-02-10 DIAGNOSIS — R7303 Prediabetes: Secondary | ICD-10-CM | POA: Insufficient documentation

## 2011-02-10 DIAGNOSIS — Z1159 Encounter for screening for other viral diseases: Secondary | ICD-10-CM | POA: Insufficient documentation

## 2011-02-10 DIAGNOSIS — R5381 Other malaise: Secondary | ICD-10-CM

## 2011-02-10 DIAGNOSIS — Z1231 Encounter for screening mammogram for malignant neoplasm of breast: Secondary | ICD-10-CM

## 2011-02-10 DIAGNOSIS — M19049 Primary osteoarthritis, unspecified hand: Secondary | ICD-10-CM

## 2011-02-10 DIAGNOSIS — R0989 Other specified symptoms and signs involving the circulatory and respiratory systems: Secondary | ICD-10-CM

## 2011-02-10 MED ORDER — MELOXICAM 15 MG PO TABS: 15.0000 mg | ORAL_TABLET | Freq: Every day | ORAL | Status: DC

## 2011-02-10 NOTE — Progress Notes (Signed)
Subjective:    Patient ID: Victoria [name], female    DOB: 1956/03/17, 55 y.o.   MRN: [id number]  HPI  I have personally reviewed the Medicare Annual Wellness questionnaire and have noted 1. The patient's medical and social history 2. Their use of alcohol, tobacco or illicit drugs 3. Their current medications and supplements 4. The patient's functional ability including ADL's, fall risks, home safety risks and hearing or visual             impairment. 5. Diet and physical activities 6. Evidence for depression or mood disorders  The patients weight, height, BMI and visual acuity have been recorded in the chart I have made referrals, counseling and provided education to the patient based review of the above and I have provided the pt with a written personalized care plan for preventive services.  On disability for multiple medical issue.. For past 7 years.  Hypertension:  Well controlled on HCTZ and metoprolol. Using medication without problems or lightheadedness:  Chest pain with exertion: None Edema:None Short of breath:Yes, see below Average home BPs: daughter checks at home. Other issues:  Hypothyroid: We controlled on current alternating dose of levothyroxine. Lab Results  Component Value Date   TSH 2.74 02/06/2011   Back on B12 injections for B12 deficiency. B12 in nml range.  Fatigue continues, minimal improvement back on B12. She feels mood is fairly decent, no depression.  Numbness in feet continues.. No improvement back on B12 injections. Bottom of feet, bilateral..puffy ..occ buring and itching. Notice it most wearing socks or under sheets in bed.  Numbness under 2 and 3rd toes.. Bilateral intermittant. No foot pain . No rash, no redness.  Never had nerve conduction in past.  Daughter has noted she has been more short of breath walking up stairs. Worse in last month. No cough or wheeze. No smoking history. NO SOB at rest. Feels overwhelmingly tired. No chest pain  with exertion.   Has seen Dr. Katrinka Blazing in past. 2007 Stress test abnormal due to artifact  12/2005 Cardiac cath (-)   Cholesterol well controlled on no medicaiton. Labs reviewed in detail with pt.   Review of Systems  Constitutional: Positive for fatigue. Negative for fever.  HENT: Negative for ear pain.   Eyes: Negative for pain.  Respiratory: Positive for shortness of breath. Negative for cough, chest tightness and wheezing.   Cardiovascular: Negative for chest pain, palpitations and leg swelling.  Gastrointestinal: Negative for nausea, diarrhea, constipation, blood in stool and abdominal distention.  Genitourinary: Negative for dysuria, vaginal bleeding, vaginal discharge and pelvic pain.  Musculoskeletal:       Arthritis in hands.. Fingers stiff.  Neurological: Negative for syncope, weakness and light-headedness.       Has noted some memory issues in last 7-8 years  Psychiatric/Behavioral: Negative for behavioral problems and dysphoric mood. The patient is not nervous/anxious.        Objective:   Physical Exam  Constitutional: Vital signs are normal. She appears well-developed and well-nourished. She is cooperative.  Non-toxic appearance. She does not appear ill. No distress.       Flat affect, depressed appearing, no smiling.. But i have never seen pt different  HENT:  Head: Normocephalic.  Right Ear: Hearing, tympanic membrane, external ear and ear canal normal.  Left Ear: Hearing, tympanic membrane, external ear and ear canal normal.  Nose: Nose normal.  Eyes: Conjunctivae, EOM and lids are normal. Pupils are equal, round, and reactive to light. No foreign bodies found.  Neck: Trachea normal and normal range of motion. Neck supple. Carotid bruit is not present. No mass and no thyromegaly present.  Cardiovascular: Normal rate, regular rhythm, S1 normal, S2 normal, normal heart sounds and intact distal pulses.  Exam reveals no gallop.   No murmur heard. Pulmonary/Chest: Effort  normal and breath sounds normal. No respiratory distress. She has no wheezes. She has no rhonchi. She has no rales.  Abdominal: Soft. Normal appearance and bowel sounds are normal. She exhibits no distension, no fluid wave, no abdominal bruit and no mass. There is no hepatosplenomegaly. There is no tenderness. There is no rebound, no guarding and no CVA tenderness. No hernia. Hernia confirmed negative in the right inguinal area and confirmed negative in the left inguinal area.  Genitourinary: Rectum normal, vagina normal and uterus normal. Rectal exam shows no external hemorrhoid and no fissure. No breast swelling, tenderness, discharge or bleeding. Pelvic exam was performed with patient prone. There is no rash, tenderness or lesion on the right labia. There is no rash, tenderness or lesion on the left labia. Uterus is not enlarged and not tender. Cervix exhibits no motion tenderness, no discharge and no friability. Right adnexum displays no mass, no tenderness and no fullness. Left adnexum displays no mass, no tenderness and no fullness. No erythema, tenderness or bleeding around the vagina. No foreign body around the vagina. No signs of injury around the vagina. No vaginal discharge found.  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    She has no axillary adenopathy.  Neurological: She is alert. She has normal strength. No cranial nerve deficit or sensory deficit.  Skin: Skin is warm, dry and intact. No rash noted.  Psychiatric: Her speech is normal and behavior is normal. Judgment normal. Her mood appears not anxious. Her affect is blunt. She is not slowed and not withdrawn. She exhibits a depressed mood. She expresses no suicidal ideation. She expresses no suicidal plans and no homicidal plans. She exhibits abnormal recent memory. She exhibits normal remote memory.          Assessment & Plan:  Annual Medicare  Wellness: The patient's preventative maintenance and recommended screening tests for an annual wellness exam were reviewed in full today. Brought up to date unless services declined.  Counselled on the importance of diet, exercise, and its role in overall health and mortality. The patient's FH and SH was reviewed, including their home life, tobacco status, and drug and alcohol status.   Up to Date with vaccines.  Due for mammogram.  Last colonoscopy 2003 or2007, unclear in records, pt does not remember... Next due in 2013 to 2017. Pap in 2009 and 2010 nml, skipped 2011. Check this year, then if negative move to 3 year pap schedule.

## 2011-02-10 NOTE — Assessment & Plan Note (Signed)
Trail of meloxicam for hand pain.

## 2011-02-10 NOTE — Patient Instructions (Addendum)
Working on decreasing sweets in diet, decrease carbohydrates.. Like pasta, potatos, rice, sweet drinks like soda, sweet tea. Start meloxicam for hand arthritis pain. Call if not working as desired. Will evaluate foot numbness and burning with nerve conduction tests.  For shortness of breath. We will check cbc and CXR and will call with your results. Today: EKG showed:

## 2011-02-10 NOTE — Assessment & Plan Note (Addendum)
No ETOH use, no clear med side effects. B12 now in nml range.  nml thyroid. No current back issues por radicular symptoms.  Eval further with nerve conduction in feet. Eval for DM with A1C as glucose elevated today.

## 2011-02-10 NOTE — Assessment & Plan Note (Signed)
Well controlled. Continue current medication.  

## 2011-02-10 NOTE — Assessment & Plan Note (Addendum)
As well as fatigue. Recent labs nml. But cbc not evaluated.. Will check today. Pt does have vitiligo but no other family history of autoimmune disease. EKG today showed: NSR, no LVH, no Q, no ST changes, no previous for comparison. Will eval with CXR as well. Consider autoimmune disease work up as well as possible referral back to cards... Last cath negative in 2007.

## 2011-02-10 NOTE — Assessment & Plan Note (Signed)
Counseled on lifestyle changes. Poor diet.. Stop sweet tea.

## 2011-02-11 LAB — CBC WITH DIFFERENTIAL/PLATELET
Basophils Absolute: 0 10*3/uL (ref 0.0–0.1)
Eosinophils Absolute: 0.2 10*3/uL (ref 0.0–0.7)
Lymphs Abs: 3 10*3/uL (ref 0.7–4.0)
MCH: 27.8 pg (ref 26.0–34.0)
Neutrophils Relative %: 42 % — ABNORMAL LOW (ref 43–77)
Platelets: 306 10*3/uL (ref 150–400)
RBC: 4.46 MIL/uL (ref 3.87–5.11)
RDW: 14.5 % (ref 11.5–15.5)
WBC: 7.6 10*3/uL (ref 4.0–10.5)

## 2011-02-11 LAB — HEMOGLOBIN A1C: Mean Plasma Glucose: 123 mg/dL — ABNORMAL HIGH (ref <117)

## 2011-02-22 ENCOUNTER — Ambulatory Visit
Admission: RE | Admit: 2011-02-22 | Discharge: 2011-02-22 | Disposition: A | Discharge status: Home / Self Care | Payer: Medicare HMO | Source: Ambulatory Visit | Attending: Family Medicine | Admitting: Family Medicine

## 2011-02-22 DIAGNOSIS — Z1231 Encounter for screening mammogram for malignant neoplasm of breast: Secondary | ICD-10-CM

## 2011-02-23 ENCOUNTER — Ambulatory Visit (INDEPENDENT_AMBULATORY_CARE_PROVIDER_SITE_OTHER): Payer: Medicare HMO | Admitting: Family Medicine

## 2011-02-23 ENCOUNTER — Encounter: Payer: Self-pay | Admitting: *Deleted

## 2011-02-23 ENCOUNTER — Encounter: Payer: Self-pay | Admitting: Family Medicine

## 2011-02-23 DIAGNOSIS — R7309 Other abnormal glucose: Secondary | ICD-10-CM

## 2011-02-23 DIAGNOSIS — M19049 Primary osteoarthritis, unspecified hand: Secondary | ICD-10-CM

## 2011-02-23 DIAGNOSIS — G589 Mononeuropathy, unspecified: Secondary | ICD-10-CM

## 2011-02-23 DIAGNOSIS — R5381 Other malaise: Secondary | ICD-10-CM

## 2011-02-23 DIAGNOSIS — R5383 Other fatigue: Secondary | ICD-10-CM

## 2011-02-23 DIAGNOSIS — R7303 Prediabetes: Secondary | ICD-10-CM

## 2011-02-23 DIAGNOSIS — R0989 Other specified symptoms and signs involving the circulatory and respiratory systems: Secondary | ICD-10-CM

## 2011-02-23 NOTE — Assessment & Plan Note (Signed)
Possibly due to deconditioning and poor lifestyle. Work on exercise, weight loss and healthy eating.   Eval with EKG, labs nml.

## 2011-02-23 NOTE — Patient Instructions (Signed)
Work on exercise weight loss and healthy eating. Info on prediabetes given, work on low carbohydrate diet.

## 2011-02-23 NOTE — Assessment & Plan Note (Signed)
Pt refuses nerve conduction test. Symptoms are better with meloxicam although not started for this issue; sympotmsmay have some contribution from arthritis.

## 2011-02-23 NOTE — Assessment & Plan Note (Signed)
Resolved with meloxicam. Use prn as long as no SE.

## 2011-02-23 NOTE — Progress Notes (Signed)
Subjective:    Patient ID: Victoria [name], female    DOB: [birth year], 55 y.o.   MRN: [id number]  HPI  Numbness in feet continues, but better than last OV with meloxicam. Bottom of feet, bilateral..puffy ..occ buring and itching. Notice it most wearing socks or under sheets in bed.  Numbness under 2 and 3rd toes.. Bilateral intermittant.  No foot pain .  No rash, no redness.  Never had nerve conduction in past.. Scheduled but pending. A1C only very slightly elevated at 5.9. No ETOH use, no back problems.  Dyspnea on Exertion: :Last OV nml EKG, nl CXR, nml labs including nml cbc. She states today SOB with exertion is a little bit better. No cough or wheeze.  No smoking history.  NO SOB at rest. Still feels overwhelmingly tired, but a little bit better since adding the B12 oral vitamin as well as the injections.  No chest pain with exertion.  Has seen Dr. Katrinka Blazing in past.  2007 Stress test abnormal due to artifact  12/2005 Cardiac cath (-)   She feels mood is fairly decent, no depression.   Arthritis in hands is better with the meloxicam.. No longer hands getting locked up. No SE to this medication.  She states today that she is not interested in nerve conduction or ECHo eval of heart or looking further into these issues at this time since she is feeling better.    Review of Systems  Constitutional: Positive for fatigue. Negative for fever.  HENT: Negative for ear pain.   Eyes: Negative for pain.  Respiratory: Positive for shortness of breath. Negative for chest tightness and wheezing.   Cardiovascular: Negative for chest pain, palpitations and leg swelling.  Gastrointestinal: Negative for abdominal pain.  Genitourinary: Negative for dysuria.       Objective:   Physical Exam  Constitutional: Vital signs are normal. She appears well-developed and well-nourished. She is cooperative.  Non-toxic appearance. She does not appear ill. No distress.       Overweight female in NAD    HENT:  Head: Normocephalic.  Right Ear: Hearing, tympanic membrane, external ear and ear canal normal. Tympanic membrane is not erythematous, not retracted and not bulging.  Left Ear: Hearing, tympanic membrane, external ear and ear canal normal. Tympanic membrane is not erythematous, not retracted and not bulging.  Nose: No mucosal edema or rhinorrhea. Right sinus exhibits no maxillary sinus tenderness and no frontal sinus tenderness. Left sinus exhibits no maxillary sinus tenderness and no frontal sinus tenderness.  Mouth/Throat: Uvula is midline, oropharynx is clear and moist and mucous membranes are normal.  Eyes: Conjunctivae, EOM and lids are normal. Pupils are equal, round, and reactive to light. No foreign bodies found.  Neck: Trachea normal and normal range of motion. Neck supple. Carotid bruit is not present. No mass and no thyromegaly present.  Cardiovascular: Normal rate, regular rhythm, S1 normal, S2 normal, normal heart sounds, intact distal pulses and normal pulses.  Exam reveals no gallop and no friction rub.   No murmur heard. Pulmonary/Chest: Effort normal and breath sounds normal. Not tachypneic. No respiratory distress. She has no decreased breath sounds. She has no wheezes. She has no rhonchi. She has no rales.  Abdominal: Soft. Normal appearance and bowel sounds are normal. There is no tenderness.  Neurological: She is alert.  Skin: Skin is warm, dry and intact. No rash noted.  Psychiatric: Her speech is normal and behavior is normal. Judgment and thought content normal. Her mood appears not anxious. Her  affect is blunt. Cognition and memory are normal. She does not exhibit a depressed mood.       Always has flat affect, but did smile several times today at OV.          Assessment & Plan:

## 2011-02-23 NOTE — Assessment & Plan Note (Signed)
Improved per pt..EKG, CXR and labs normal. Recommended eval with ECHO although this may be due to overweight status and deconditioning.  LAst cath nml in 2007 with Dr. Katrinka Blazing. She states that she is not interested in further work up at this time. Follow up in 3 months.

## 2011-02-23 NOTE — Assessment & Plan Note (Signed)
Info given. Discussed lifestyle change.

## 2011-03-22 ENCOUNTER — Other Ambulatory Visit: Payer: Self-pay | Admitting: Family Medicine

## 2011-03-22 ENCOUNTER — Ambulatory Visit (INDEPENDENT_AMBULATORY_CARE_PROVIDER_SITE_OTHER): Payer: Medicare HMO | Admitting: *Deleted

## 2011-03-22 DIAGNOSIS — E538 Deficiency of other specified B group vitamins: Secondary | ICD-10-CM

## 2011-03-22 MED ORDER — CYANOCOBALAMIN 1000 MCG/ML IJ SOLN
1000.0000 ug | Freq: Once | INTRAMUSCULAR | Status: AC
  Administered 2011-03-22: 1000 ug via INTRAMUSCULAR

## 2011-03-22 NOTE — Progress Notes (Signed)
  Subjective:    Patient ID: Victoria [name], female    DOB: 04/29/1956, 55 y.o.   MRN: [id number]  HPI    Review of Systems     Objective:   Physical Exam        Assessment & Plan:  Patient received b-12 injection today at nurse visit

## 2011-03-23 ENCOUNTER — Ambulatory Visit: Payer: Medicare HMO

## 2011-04-21 ENCOUNTER — Ambulatory Visit: Payer: Medicare HMO

## 2011-04-25 ENCOUNTER — Ambulatory Visit (INDEPENDENT_AMBULATORY_CARE_PROVIDER_SITE_OTHER): Payer: Medicare HMO | Admitting: *Deleted

## 2011-04-25 DIAGNOSIS — E538 Deficiency of other specified B group vitamins: Secondary | ICD-10-CM

## 2011-04-25 MED ORDER — CYANOCOBALAMIN 1000 MCG/ML IJ SOLN
1000.0000 ug | Freq: Once | INTRAMUSCULAR | Status: AC
  Administered 2011-04-25: 1000 ug via INTRAMUSCULAR

## 2011-04-28 ENCOUNTER — Other Ambulatory Visit: Payer: Self-pay | Admitting: Family Medicine

## 2011-05-24 ENCOUNTER — Ambulatory Visit (INDEPENDENT_AMBULATORY_CARE_PROVIDER_SITE_OTHER): Payer: Medicare HMO | Admitting: *Deleted

## 2011-05-24 DIAGNOSIS — Z23 Encounter for immunization: Secondary | ICD-10-CM

## 2011-05-25 ENCOUNTER — Ambulatory Visit: Payer: Medicare HMO | Admitting: Family Medicine

## 2011-06-21 ENCOUNTER — Ambulatory Visit: Payer: Medicare HMO

## 2011-06-23 ENCOUNTER — Ambulatory Visit (INDEPENDENT_AMBULATORY_CARE_PROVIDER_SITE_OTHER): Payer: Medicare HMO | Admitting: *Deleted

## 2011-06-23 DIAGNOSIS — R5381 Other malaise: Secondary | ICD-10-CM

## 2011-06-23 DIAGNOSIS — E539 Vitamin B deficiency, unspecified: Secondary | ICD-10-CM

## 2011-06-23 MED ORDER — CYANOCOBALAMIN 1000 MCG/ML IJ SOLN
1000.0000 ug | Freq: Once | INTRAMUSCULAR | Status: AC
  Administered 2011-08-25: 1000 ug via INTRAMUSCULAR

## 2011-06-25 ENCOUNTER — Other Ambulatory Visit: Payer: Self-pay | Admitting: Family Medicine

## 2011-07-25 ENCOUNTER — Ambulatory Visit (INDEPENDENT_AMBULATORY_CARE_PROVIDER_SITE_OTHER): Payer: Medicare Other | Admitting: *Deleted

## 2011-07-25 DIAGNOSIS — E538 Deficiency of other specified B group vitamins: Secondary | ICD-10-CM

## 2011-07-25 MED ORDER — CYANOCOBALAMIN 1000 MCG/ML IJ SOLN
1000.0000 ug | Freq: Once | INTRAMUSCULAR | Status: AC
  Administered 2011-07-25: 1000 ug via INTRAMUSCULAR

## 2011-08-01 ENCOUNTER — Other Ambulatory Visit: Payer: Self-pay | Admitting: Family Medicine

## 2011-08-25 ENCOUNTER — Ambulatory Visit (INDEPENDENT_AMBULATORY_CARE_PROVIDER_SITE_OTHER): Payer: Medicare Other | Admitting: *Deleted

## 2011-08-25 DIAGNOSIS — E538 Deficiency of other specified B group vitamins: Secondary | ICD-10-CM

## 2011-08-25 DIAGNOSIS — R5381 Other malaise: Secondary | ICD-10-CM

## 2011-09-26 ENCOUNTER — Ambulatory Visit (INDEPENDENT_AMBULATORY_CARE_PROVIDER_SITE_OTHER): Payer: Medicare Other

## 2011-09-26 DIAGNOSIS — E538 Deficiency of other specified B group vitamins: Secondary | ICD-10-CM

## 2011-09-26 MED ORDER — CYANOCOBALAMIN 1000 MCG/ML IJ SOLN
1000.0000 ug | Freq: Once | INTRAMUSCULAR | Status: AC
  Administered 2011-09-26: 1000 ug via INTRAMUSCULAR

## 2011-10-13 ENCOUNTER — Other Ambulatory Visit: Payer: Self-pay | Admitting: Family Medicine

## 2011-10-25 ENCOUNTER — Ambulatory Visit (INDEPENDENT_AMBULATORY_CARE_PROVIDER_SITE_OTHER): Payer: Medicare Other

## 2011-10-25 DIAGNOSIS — E538 Deficiency of other specified B group vitamins: Secondary | ICD-10-CM

## 2011-10-25 MED ORDER — CYANOCOBALAMIN 1000 MCG/ML IJ SOLN
1000.0000 ug | Freq: Once | INTRAMUSCULAR | Status: AC
  Administered 2011-10-25: 1000 ug via INTRAMUSCULAR

## 2011-11-01 ENCOUNTER — Other Ambulatory Visit: Payer: Self-pay | Admitting: Family Medicine

## 2011-11-28 ENCOUNTER — Ambulatory Visit (INDEPENDENT_AMBULATORY_CARE_PROVIDER_SITE_OTHER): Payer: Medicare Other

## 2011-11-28 DIAGNOSIS — E538 Deficiency of other specified B group vitamins: Secondary | ICD-10-CM

## 2011-11-28 MED ORDER — CYANOCOBALAMIN 1000 MCG/ML IJ SOLN
1000.0000 ug | Freq: Once | INTRAMUSCULAR | Status: AC
  Administered 2011-11-28: 1000 ug via INTRAMUSCULAR

## 2011-11-29 ENCOUNTER — Other Ambulatory Visit: Payer: Self-pay | Admitting: Family Medicine

## 2011-12-29 ENCOUNTER — Ambulatory Visit (INDEPENDENT_AMBULATORY_CARE_PROVIDER_SITE_OTHER): Payer: Medicare Other

## 2011-12-29 DIAGNOSIS — E538 Deficiency of other specified B group vitamins: Secondary | ICD-10-CM

## 2011-12-29 MED ORDER — CYANOCOBALAMIN 1000 MCG/ML IJ SOLN
1000.0000 ug | Freq: Once | INTRAMUSCULAR | Status: AC
  Administered 2011-12-29: 1000 ug via INTRAMUSCULAR

## 2012-01-17 ENCOUNTER — Other Ambulatory Visit: Payer: Self-pay | Admitting: *Deleted

## 2012-01-17 MED ORDER — MELOXICAM 15 MG PO TABS: 15.0000 mg | ORAL_TABLET | Freq: Every day | ORAL | Status: DC

## 2012-01-17 NOTE — Telephone Encounter (Signed)
Received faxed refill request from pharmacy. Last office visit  02/23/11. Is it okay to refill medication?

## 2012-01-23 ENCOUNTER — Other Ambulatory Visit: Payer: Self-pay | Admitting: Family Medicine

## 2012-01-30 ENCOUNTER — Ambulatory Visit (INDEPENDENT_AMBULATORY_CARE_PROVIDER_SITE_OTHER): Payer: Medicare Other | Admitting: *Deleted

## 2012-01-30 DIAGNOSIS — E538 Deficiency of other specified B group vitamins: Secondary | ICD-10-CM

## 2012-01-30 DIAGNOSIS — Z111 Encounter for screening for respiratory tuberculosis: Secondary | ICD-10-CM

## 2012-01-30 MED ORDER — CYANOCOBALAMIN 1000 MCG/ML IJ SOLN
1000.0000 ug | Freq: Once | INTRAMUSCULAR | Status: AC
  Administered 2012-01-30: 1000 ug via INTRAMUSCULAR

## 2012-01-30 NOTE — Progress Notes (Signed)
  Subjective:    Patient ID: Victoria [name], female    DOB: 1955-11-16, 56 y.o.   MRN: [id number]  HPI  Patient did not have a ppd today, patient had B-12 injection  Review of Systems     Objective:   Physical Exam        Assessment & Plan:

## 2012-02-05 ENCOUNTER — Other Ambulatory Visit: Payer: Self-pay | Admitting: Family Medicine

## 2012-02-13 ENCOUNTER — Other Ambulatory Visit: Payer: Self-pay | Admitting: Family Medicine

## 2012-02-20 ENCOUNTER — Encounter: Payer: Medicare Other | Admitting: Family Medicine

## 2012-03-07 ENCOUNTER — Other Ambulatory Visit: Payer: Self-pay | Admitting: Family Medicine

## 2012-03-20 ENCOUNTER — Other Ambulatory Visit: Payer: Self-pay | Admitting: Family Medicine

## 2012-03-20 NOTE — Telephone Encounter (Signed)
Overdue for CPX.Marland Kitchen Schedule and refill until then.

## 2012-03-20 NOTE — Telephone Encounter (Signed)
Request for meloxicam 15 mg ok to refill?

## 2012-03-20 NOTE — Telephone Encounter (Signed)
[facility] refill request for Meloxicam 15 mg last filled, 02/13/12. Ok to refill?

## 2012-03-21 NOTE — Telephone Encounter (Signed)
Rx Meloxicam 15 mg #30 0 R called in to [facility]

## 2012-04-02 ENCOUNTER — Ambulatory Visit (INDEPENDENT_AMBULATORY_CARE_PROVIDER_SITE_OTHER): Payer: Medicare Other | Admitting: Family Medicine

## 2012-04-02 ENCOUNTER — Encounter: Payer: Self-pay | Admitting: Family Medicine

## 2012-04-02 VITALS — BP 130/78 | HR 80 | Temp 98.2°F | Ht 61.5 in | Wt 177.5 lb

## 2012-04-02 DIAGNOSIS — Z23 Encounter for immunization: Secondary | ICD-10-CM

## 2012-04-02 DIAGNOSIS — M19049 Primary osteoarthritis, unspecified hand: Secondary | ICD-10-CM

## 2012-04-02 DIAGNOSIS — Z79899 Other long term (current) drug therapy: Secondary | ICD-10-CM

## 2012-04-02 DIAGNOSIS — E538 Deficiency of other specified B group vitamins: Secondary | ICD-10-CM

## 2012-04-02 DIAGNOSIS — I10 Essential (primary) hypertension: Secondary | ICD-10-CM

## 2012-04-02 DIAGNOSIS — Z1231 Encounter for screening mammogram for malignant neoplasm of breast: Secondary | ICD-10-CM

## 2012-04-02 DIAGNOSIS — R7303 Prediabetes: Secondary | ICD-10-CM

## 2012-04-02 DIAGNOSIS — E039 Hypothyroidism, unspecified: Secondary | ICD-10-CM

## 2012-04-02 DIAGNOSIS — R5381 Other malaise: Secondary | ICD-10-CM

## 2012-04-02 DIAGNOSIS — Z0000 Encounter for general adult medical examination without abnormal findings: Secondary | ICD-10-CM

## 2012-04-02 DIAGNOSIS — Z1212 Encounter for screening for malignant neoplasm of rectum: Secondary | ICD-10-CM

## 2012-04-02 DIAGNOSIS — Z01419 Encounter for gynecological examination (general) (routine) without abnormal findings: Secondary | ICD-10-CM

## 2012-04-02 DIAGNOSIS — R5383 Other fatigue: Secondary | ICD-10-CM

## 2012-04-02 DIAGNOSIS — R7309 Other abnormal glucose: Secondary | ICD-10-CM

## 2012-04-02 LAB — COMPREHENSIVE METABOLIC PANEL
AST: 21 U/L (ref 0–37)
Alkaline Phosphatase: 72 U/L (ref 39–117)
BUN: 12 mg/dL (ref 6–23)
Glucose, Bld: 89 mg/dL (ref 70–99)
Sodium: 137 mEq/L (ref 135–145)
Total Bilirubin: 0.6 mg/dL (ref 0.3–1.2)

## 2012-04-02 LAB — CBC WITH DIFFERENTIAL/PLATELET
Basophils Absolute: 0.1 10*3/uL (ref 0.0–0.1)
Eosinophils Absolute: 0.2 10*3/uL (ref 0.0–0.7)
Lymphocytes Relative: 25.7 % (ref 12.0–46.0)
MCHC: 32.7 g/dL (ref 30.0–36.0)
Neutro Abs: 4.3 10*3/uL (ref 1.4–7.7)
Neutrophils Relative %: 58.3 % (ref 43.0–77.0)
Platelets: 318 10*3/uL (ref 150.0–400.0)
RDW: 14.8 % — ABNORMAL HIGH (ref 11.5–14.6)

## 2012-04-02 LAB — TSH: TSH: 0.43 u[IU]/mL (ref 0.35–5.50)

## 2012-04-02 LAB — LIPID PANEL
Cholesterol: 155 mg/dL (ref 0–200)
LDL Cholesterol: 95 mg/dL (ref 0–99)
Triglycerides: 110 mg/dL (ref 0.0–149.0)
VLDL: 22 mg/dL (ref 0.0–40.0)

## 2012-04-02 MED ORDER — CYANOCOBALAMIN 1000 MCG/ML IJ SOLN
1000.0000 ug | Freq: Once | INTRAMUSCULAR | Status: AC
  Administered 2012-04-02: 1000 ug via INTRAMUSCULAR

## 2012-04-02 NOTE — Progress Notes (Signed)
Subjective:    Patient ID: Victoria [name], female    DOB: Oct 02, 1955, 56 y.o.   MRN: [id number]  HPI I have personally reviewed the Medicare Annual Wellness questionnaire and have noted  1. The patient's medical and social history  2. Their use of alcohol, tobacco or illicit drugs  3. Their current medications and supplements  4. The patient's functional ability including ADL's, fall risks, home safety risks and hearing or visual  impairment.  5. Diet and physical activities  6. Evidence for depression or mood disorders  The patients weight, height, BMI and visual acuity have been recorded in the chart  I have made referrals, counseling and provided education to the patient based review of the above and I have provided the pt with a written personalized care plan for preventive services.   On disability for multiple medical issue.. For past 7 years.   Hypertension: Well controlled on HCTZ and metoprolol.  Using medication without problems or lightheadedness:  Chest pain with exertion: None  Edema:None  Short of breath:Yes, see below  Average home BPs: daughter checks at home. Other issues:   Hypothyroid: Due for lab re-eval.  B12 deficiency: due for re-eval.   Last year she was having numbness in feet and DOE... Refused nerve conduction and ECHO given on 2 week follow up she was feeling better.  Labs eval was negative. CXR clear. See 02/23/11 OV.  A1C 5.9.   She has been having some pain in right thumb. Noted swelling in [facility] joint , no redness.  She is occasionally using aleve for pain, helps some. She is right handed.  Plans knee replacement in 05/2012 of right knee. Dr. Judson Roch.  Due for cholesterol and DM recheck..       Review of Systems  Constitutional: Positive for fatigue.  HENT: Negative for ear pain.   Eyes: Negative for pain.  Respiratory: Negative for shortness of breath and wheezing.   Cardiovascular: Negative for chest pain, palpitations and leg swelling.    Gastrointestinal: Negative for abdominal pain.  Genitourinary: Negative for dysuria.  Musculoskeletal: Negative for back pain.       Objective:   Physical Exam  Constitutional: Vital signs are normal. She appears well-developed and well-nourished. She is cooperative.  Non-toxic appearance. She does not appear ill. No distress.       overweight  HENT:  Head: Normocephalic.  Right Ear: Hearing, tympanic membrane, external ear and ear canal normal.  Left Ear: Hearing, tympanic membrane, external ear and ear canal normal.  Nose: Nose normal.  Eyes: Conjunctivae normal, EOM and lids are normal. Pupils are equal, round, and reactive to light. No foreign bodies found.  Neck: Trachea normal and normal range of motion. Neck supple. Carotid bruit is not present. No mass and no thyromegaly present.  Cardiovascular: Normal rate, regular rhythm, S1 normal, S2 normal, normal heart sounds and intact distal pulses.  Exam reveals no gallop.   No murmur heard. Pulmonary/Chest: Effort normal and breath sounds normal. No respiratory distress. She has no wheezes. She has no rhonchi. She has no rales.  Abdominal: Soft. Normal appearance and bowel sounds are normal. She exhibits no distension, no fluid wave, no abdominal bruit and no mass. There is no hepatosplenomegaly. There is no tenderness. There is no rebound, no guarding and no CVA tenderness. No hernia.  Genitourinary: Vagina normal and uterus normal. No breast swelling, tenderness, discharge or bleeding. Pelvic exam was performed with patient prone. There is no rash, tenderness or lesion on the  right labia. There is no rash, tenderness or lesion on the left labia. Uterus is not enlarged and not tender. Right adnexum displays no mass, no tenderness and no fullness. Left adnexum displays no mass, no tenderness and no fullness.  Musculoskeletal:       Right [facility] joint tender, swollen, no redness, positive grind test.  Lymphadenopathy:    She has no cervical  adenopathy.    She has no axillary adenopathy.  Neurological: She is alert. She has normal strength. No cranial nerve deficit or sensory deficit.  Skin: Skin is warm, dry and intact. No rash noted.  Psychiatric: Her speech is normal and behavior is normal. Judgment normal. Her mood appears not anxious. Cognition and memory are normal. She does not exhibit a depressed mood.          Assessment & Plan:  The patient's preventative maintenance and recommended screening tests for an annual wellness exam were reviewed in full today.  Brought up to date unless services declined.  Counselled on the importance of diet, exercise, and its role in overall health and mortality.  The patient's FH and SH was reviewed, including their home life, tobacco status, and drug and alcohol status.   Up to Date with vaccines, getting flu vaccine today. Due for mammogram.  Last colonoscopy 2003 or 2007, unclear in records, pt does not remember when or where she had it... Next due in 2013 to 2017. Given this we will start doing yearly iFOB. Pap in 2009 and 2010 nml, skipped 2011, nml 2012. Now on 3 year pap schedule. DVE yearly. Bone density: no family history known of osteoporosis, no steroids,  Nonsmoker

## 2012-04-02 NOTE — Assessment & Plan Note (Signed)
Well controlled. Continue current medication.  

## 2012-04-02 NOTE — Assessment & Plan Note (Signed)
Due for re-eval. 

## 2012-04-02 NOTE — Assessment & Plan Note (Signed)
B12 injection given today. 

## 2012-04-02 NOTE — Assessment & Plan Note (Signed)
Chronic. 

## 2012-04-02 NOTE — Patient Instructions (Addendum)
CMC joint arthritis: Use aleve for inflammation and pain. Wear brace during the day.  Stop by lab on your way out.  Also stop by front desk to set up mammogram.

## 2012-04-02 NOTE — Assessment & Plan Note (Signed)
Treat with antinflammatory and brace, rx  Given. Pt not interested in steroid injection at this joint.

## 2012-04-02 NOTE — Addendum Note (Signed)
Addended by: Sydell Axon C on: 04/02/2012 10:02 AM   Modules accepted: Orders

## 2012-04-05 ENCOUNTER — Other Ambulatory Visit: Payer: Medicare Other

## 2012-04-05 ENCOUNTER — Encounter: Payer: Self-pay | Admitting: Family Medicine

## 2012-04-05 DIAGNOSIS — Z1212 Encounter for screening for malignant neoplasm of rectum: Secondary | ICD-10-CM

## 2012-04-05 LAB — FECAL OCCULT BLOOD, IMMUNOCHEMICAL: Fecal Occult Bld: NEGATIVE

## 2012-04-05 LAB — FECAL OCCULT BLOOD, GUAIAC: Fecal Occult Blood: NEGATIVE

## 2012-04-21 ENCOUNTER — Other Ambulatory Visit: Payer: Self-pay | Admitting: Family Medicine

## 2012-04-22 NOTE — Telephone Encounter (Signed)
Received refill request electronically from pharmacy. Last office visit 04/02/12. Is it okay to refill medication?

## 2012-05-03 ENCOUNTER — Ambulatory Visit (INDEPENDENT_AMBULATORY_CARE_PROVIDER_SITE_OTHER): Payer: Medicare Other | Admitting: *Deleted

## 2012-05-03 DIAGNOSIS — E538 Deficiency of other specified B group vitamins: Secondary | ICD-10-CM

## 2012-05-03 MED ORDER — CYANOCOBALAMIN 1000 MCG/ML IJ SOLN
1000.0000 ug | Freq: Once | INTRAMUSCULAR | Status: AC
  Administered 2012-05-03: 1000 ug via INTRAMUSCULAR

## 2012-05-03 MED ORDER — CYANOCOBALAMIN 1000 MCG/ML IJ SOLN: 1000.0000 ug | Freq: Once | INTRAMUSCULAR | Status: DC

## 2012-05-06 ENCOUNTER — Telehealth: Payer: Self-pay

## 2012-05-06 NOTE — Telephone Encounter (Signed)
Pt left v/m she got call from [facility] that had prescription for Vit B 12 to be picked up. Pt has never given self B 12 injections and just received B 12 injection 05/03/12 in our office. Pt request call back.

## 2012-05-06 NOTE — Telephone Encounter (Signed)
Patient advised mistake was made in the computer with ordering.

## 2012-05-08 ENCOUNTER — Other Ambulatory Visit: Payer: Self-pay | Admitting: Family Medicine

## 2012-05-14 ENCOUNTER — Ambulatory Visit
Admission: RE | Admit: 2012-05-14 | Discharge: 2012-05-14 | Disposition: A | Discharge status: Home / Self Care | Payer: Medicare Other | Source: Ambulatory Visit | Attending: Family Medicine | Admitting: Family Medicine

## 2012-05-14 DIAGNOSIS — Z1231 Encounter for screening mammogram for malignant neoplasm of breast: Secondary | ICD-10-CM

## 2012-06-04 ENCOUNTER — Ambulatory Visit (INDEPENDENT_AMBULATORY_CARE_PROVIDER_SITE_OTHER): Payer: Medicare Other | Admitting: *Deleted

## 2012-06-04 DIAGNOSIS — E538 Deficiency of other specified B group vitamins: Secondary | ICD-10-CM

## 2012-06-04 MED ORDER — CYANOCOBALAMIN 1000 MCG/ML IJ SOLN
1000.0000 ug | Freq: Once | INTRAMUSCULAR | Status: AC
  Administered 2012-06-04: 1000 ug via INTRAMUSCULAR

## 2012-06-13 ENCOUNTER — Other Ambulatory Visit: Payer: Self-pay | Admitting: Family Medicine

## 2012-06-19 ENCOUNTER — Other Ambulatory Visit: Payer: Self-pay | Admitting: Family Medicine

## 2012-07-04 ENCOUNTER — Other Ambulatory Visit: Payer: Self-pay | Admitting: Family Medicine

## 2012-07-05 ENCOUNTER — Ambulatory Visit (INDEPENDENT_AMBULATORY_CARE_PROVIDER_SITE_OTHER): Payer: Medicare Other | Admitting: *Deleted

## 2012-07-05 DIAGNOSIS — E538 Deficiency of other specified B group vitamins: Secondary | ICD-10-CM

## 2012-07-05 MED ORDER — CYANOCOBALAMIN 1000 MCG/ML IJ SOLN
1000.0000 ug | Freq: Once | INTRAMUSCULAR | Status: AC
  Administered 2012-07-05: 1000 ug via INTRAMUSCULAR

## 2012-07-17 ENCOUNTER — Other Ambulatory Visit: Payer: Self-pay | Admitting: Family Medicine

## 2012-08-08 ENCOUNTER — Ambulatory Visit: Payer: Medicare Other

## 2012-08-13 ENCOUNTER — Ambulatory Visit (INDEPENDENT_AMBULATORY_CARE_PROVIDER_SITE_OTHER): Payer: Medicare Other | Admitting: *Deleted

## 2012-08-13 DIAGNOSIS — E538 Deficiency of other specified B group vitamins: Secondary | ICD-10-CM

## 2012-08-13 MED ORDER — CYANOCOBALAMIN 1000 MCG/ML IJ SOLN
1000.0000 ug | Freq: Once | INTRAMUSCULAR | Status: AC
  Administered 2012-08-13: 1000 ug via INTRAMUSCULAR

## 2012-08-15 ENCOUNTER — Other Ambulatory Visit: Payer: Self-pay | Admitting: *Deleted

## 2012-08-15 MED ORDER — LEVOTHYROXINE SODIUM 112 MCG PO TABS: ORAL_TABLET | ORAL | Status: DC

## 2012-08-18 ENCOUNTER — Other Ambulatory Visit: Payer: Self-pay | Admitting: Family Medicine

## 2012-08-23 ENCOUNTER — Other Ambulatory Visit: Payer: Self-pay | Admitting: Family Medicine

## 2012-09-12 ENCOUNTER — Ambulatory Visit (INDEPENDENT_AMBULATORY_CARE_PROVIDER_SITE_OTHER): Payer: Medicare Other | Admitting: *Deleted

## 2012-09-12 DIAGNOSIS — E538 Deficiency of other specified B group vitamins: Secondary | ICD-10-CM

## 2012-09-12 MED ORDER — CYANOCOBALAMIN 1000 MCG/ML IJ SOLN
1000.0000 ug | Freq: Once | INTRAMUSCULAR | Status: AC
  Administered 2012-09-12: 1000 ug via INTRAMUSCULAR

## 2012-09-24 ENCOUNTER — Other Ambulatory Visit: Payer: Self-pay | Admitting: Family Medicine

## 2012-10-17 ENCOUNTER — Ambulatory Visit (INDEPENDENT_AMBULATORY_CARE_PROVIDER_SITE_OTHER): Payer: Medicare Other | Admitting: *Deleted

## 2012-10-17 DIAGNOSIS — E538 Deficiency of other specified B group vitamins: Secondary | ICD-10-CM

## 2012-10-17 MED ORDER — CYANOCOBALAMIN 1000 MCG/ML IJ SOLN
1000.0000 ug | Freq: Once | INTRAMUSCULAR | Status: AC
  Administered 2012-10-17: 1000 ug via INTRAMUSCULAR

## 2012-10-25 ENCOUNTER — Other Ambulatory Visit: Payer: Self-pay | Admitting: Family Medicine

## 2012-11-26 ENCOUNTER — Other Ambulatory Visit: Payer: Self-pay | Admitting: Family Medicine

## 2012-12-25 ENCOUNTER — Other Ambulatory Visit: Payer: Self-pay | Admitting: *Deleted

## 2012-12-25 MED ORDER — MELOXICAM 15 MG PO TABS: ORAL_TABLET | ORAL | Status: DC

## 2012-12-25 NOTE — Telephone Encounter (Signed)
Faxed refill request from [facility], last filled 11/27/12.

## 2013-01-24 ENCOUNTER — Other Ambulatory Visit: Payer: Self-pay | Admitting: *Deleted

## 2013-01-24 MED ORDER — METOPROLOL SUCCINATE ER 25 MG PO TB24: ORAL_TABLET | ORAL | Status: DC

## 2013-02-10 ENCOUNTER — Other Ambulatory Visit: Payer: Self-pay | Admitting: *Deleted

## 2013-02-10 MED ORDER — LEVOTHYROXINE SODIUM 100 MCG PO TABS: ORAL_TABLET | ORAL | Status: DC

## 2013-02-13 ENCOUNTER — Ambulatory Visit (INDEPENDENT_AMBULATORY_CARE_PROVIDER_SITE_OTHER): Payer: Medicare Other | Admitting: Family Medicine

## 2013-02-13 ENCOUNTER — Encounter: Payer: Self-pay | Admitting: Family Medicine

## 2013-02-13 VITALS — BP 130/86 | HR 69 | Temp 98.2°F | Wt 180.5 lb

## 2013-02-13 DIAGNOSIS — H9201 Otalgia, right ear: Secondary | ICD-10-CM

## 2013-02-13 DIAGNOSIS — H9209 Otalgia, unspecified ear: Secondary | ICD-10-CM

## 2013-02-13 MED ORDER — HYDROCODONE-ACETAMINOPHEN 5-325 MG PO TABS: 0.5000 | ORAL_TABLET | Freq: Four times a day (QID) | ORAL | Status: DC | PRN

## 2013-02-13 MED ORDER — HYDROCORTISONE-ACETIC ACID 1-2 % OT SOLN: 4.0000 [drp] | Freq: Two times a day (BID) | OTIC | Status: DC

## 2013-02-13 MED ORDER — GABAPENTIN 100 MG PO CAPS: 100.0000 mg | ORAL_CAPSULE | Freq: Two times a day (BID) | ORAL | Status: DC

## 2013-02-13 NOTE — Progress Notes (Signed)
  Subjective:    Patient ID: Victoria [name], female    DOB: [birth year], 57 y.o.   MRN: [id number]  HPI CC: R ear pain  2d h/o R ear pain associated with headache on right side of head and neck pain and right sided ST.  Having sharp pains that start at ear.  Some muffled hearing.  Even sensitive to cold air.  Describes sharp stabbing pain that lasts seconds.  Denies numbness or tingling.  No recent swimming.  No congestion, sneezing, rhinorrhea, tooth pain.  No vision changes.  So far taking tylenol which hasn't helped.  No sick contacts at home. No smokers at home. Denies inciting trauma.    Did get hair washed 2 wks ago.  Past Medical History  Diagnosis Date  . Other B-complex deficiencies   . Obesity, unspecified   . Jaundice, unspecified, not of newborn     Scleral icterus since age 34  . Carpal tunnel syndrome   . Unspecified monoarthritis, forearm   . Unspecified hypothyroidism   . Unspecified essential hypertension      Review of Systems Per HPI    Objective:   Physical Exam  Nursing note and vitals reviewed. Constitutional: She appears well-developed and well-nourished. No distress.  HENT:  Head: Normocephalic and atraumatic.  Right Ear: Hearing, tympanic membrane, external ear and ear canal normal.  Left Ear: Hearing, external ear and ear canal normal.  Nose: No mucosal edema or rhinorrhea. Right sinus exhibits frontal sinus tenderness. Right sinus exhibits no maxillary sinus tenderness. Left sinus exhibits no maxillary sinus tenderness and no frontal sinus tenderness.  Mouth/Throat: Uvula is midline, oropharynx is clear and moist and mucous membranes are normal. No oropharyngeal exudate, posterior oropharyngeal edema, posterior oropharyngeal erythema or tonsillar abscesses.  Cerumen covering L TM R TM clear, pearly grey, some erythema/injection anterior TM. Tender to palpation at occipital region as well as mastoid process. No temporal pain. No pain at TMJ.  Eyes:  Conjunctivae and EOM are normal. Pupils are equal, round, and reactive to light. No scleral icterus.  Neck: Normal range of motion. Neck supple. No thyromegaly present.  Lymphadenopathy:    She has no cervical adenopathy.       Assessment & Plan:

## 2013-02-13 NOTE — Assessment & Plan Note (Signed)
Mild erythema of anterior ear canal - ?external otitis - treat with vosol HC and hydrocodone prn. However, description of pain is more consistent with neuralgia type pain - start gabapentin for nerve pain component.   Not in trigeminal distribution, ?occipital neuralgia. Monitor temperature and return if fever, or worsening pain despite above.  Pt agrees with plan. Sedation precautions discussed with narcotic.

## 2013-02-13 NOTE — Patient Instructions (Signed)
I'd like to treat you for both ear infection and nerve pain of right face/neck - ?occipital neuralgia. Treat with ear drops, narcotic for breakthrough pain, and start nerve pain gabapentin 100mg  twice daily to help with pain. Please let us know if not improving with this treatment, or return to see [place].

## 2013-03-27 ENCOUNTER — Other Ambulatory Visit (INDEPENDENT_AMBULATORY_CARE_PROVIDER_SITE_OTHER): Payer: Medicare Other

## 2013-03-27 ENCOUNTER — Telehealth: Payer: Self-pay | Admitting: Family Medicine

## 2013-03-27 DIAGNOSIS — E039 Hypothyroidism, unspecified: Secondary | ICD-10-CM

## 2013-03-27 DIAGNOSIS — R7309 Other abnormal glucose: Secondary | ICD-10-CM

## 2013-03-27 DIAGNOSIS — E538 Deficiency of other specified B group vitamins: Secondary | ICD-10-CM

## 2013-03-27 DIAGNOSIS — R5381 Other malaise: Secondary | ICD-10-CM

## 2013-03-27 DIAGNOSIS — Z13220 Encounter for screening for lipoid disorders: Secondary | ICD-10-CM

## 2013-03-27 DIAGNOSIS — R7303 Prediabetes: Secondary | ICD-10-CM

## 2013-03-27 DIAGNOSIS — I10 Essential (primary) hypertension: Secondary | ICD-10-CM

## 2013-03-27 LAB — CBC WITH DIFFERENTIAL/PLATELET
Basophils Relative: 1.2 % (ref 0.0–3.0)
Eosinophils Absolute: 0.3 10*3/uL (ref 0.0–0.7)
Eosinophils Relative: 4.4 % (ref 0.0–5.0)
HCT: 36.4 % (ref 36.0–46.0)
Hemoglobin: 12.2 g/dL (ref 12.0–15.0)
Lymphocytes Relative: 32.1 % (ref 12.0–46.0)
MCHC: 33.4 g/dL (ref 30.0–36.0)
Monocytes Absolute: 1.2 10*3/uL — ABNORMAL HIGH (ref 0.1–1.0)
Neutro Abs: 3.3 10*3/uL (ref 1.4–7.7)
Neutrophils Relative %: 45.6 % (ref 43.0–77.0)
RBC: 4.6 Mil/uL (ref 3.87–5.11)
RDW: 15.8 % — ABNORMAL HIGH (ref 11.5–14.6)
WBC: 7.2 10*3/uL (ref 4.5–10.5)

## 2013-03-27 LAB — HEMOGLOBIN A1C: Hgb A1c MFr Bld: 6.1 % (ref 4.6–6.5)

## 2013-03-27 NOTE — Telephone Encounter (Signed)
Message copied by Excell Seltzer on Thu Mar 27, 2013  5:22 PM ------      Message from: Alvina Chou      Created: Tue Mar 18, 2013  4:30 PM      Regarding: Lab orders for Thursday, 10.2.14       Patient is scheduled for CPX labs, please order future labs, Thanks , Terri       ------

## 2013-03-28 LAB — BASIC METABOLIC PANEL
BUN: 13 mg/dL (ref 6–23)
Calcium: 9.6 mg/dL (ref 8.4–10.5)
Chloride: 102 mEq/L (ref 96–112)
Creatinine, Ser: 0.8 mg/dL (ref 0.4–1.2)
GFR: 73.77 mL/min (ref >60.00)

## 2013-03-28 LAB — HEPATIC FUNCTION PANEL
ALT: 15 U/L (ref 0–35)
AST: 20 U/L (ref 0–37)
Alkaline Phosphatase: 71 U/L (ref 39–117)
Bilirubin, Direct: 0.1 mg/dL (ref 0.0–0.3)
Total Protein: 7.6 g/dL (ref 6.0–8.3)

## 2013-03-28 LAB — TSH: TSH: 0.32 u[IU]/mL — ABNORMAL LOW (ref 0.35–5.50)

## 2013-03-28 LAB — VITAMIN B12: Vitamin B-12: 687 pg/mL (ref 211–911)

## 2013-04-03 ENCOUNTER — Ambulatory Visit (INDEPENDENT_AMBULATORY_CARE_PROVIDER_SITE_OTHER): Payer: Medicare Other | Admitting: Family Medicine

## 2013-04-03 ENCOUNTER — Encounter: Payer: Self-pay | Admitting: Family Medicine

## 2013-04-03 VITALS — BP 120/72 | HR 78 | Temp 98.2°F | Ht 61.0 in | Wt 183.8 lb

## 2013-04-03 DIAGNOSIS — Z0000 Encounter for general adult medical examination without abnormal findings: Secondary | ICD-10-CM

## 2013-04-03 DIAGNOSIS — Z23 Encounter for immunization: Secondary | ICD-10-CM

## 2013-04-03 DIAGNOSIS — Z1212 Encounter for screening for malignant neoplasm of rectum: Secondary | ICD-10-CM

## 2013-04-03 NOTE — Patient Instructions (Addendum)
Decrease sweets, avoid soda and juice, decrease potatos, pasta, bread , rice. Work on increasing exercise, weight loss and healthy eating. Continue oral B12. Stop at lab on way out to pick up stool test.  If memory issues continue.. Make appt to discuss. Schedule CP with labs prior in 1 year.

## 2013-04-03 NOTE — Progress Notes (Signed)
HPI  I have personally reviewed the Medicare Annual Wellness questionnaire and have noted  1. The patient's medical and social history  2. Their use of alcohol, tobacco or illicit drugs  3. Their current medications and supplements  4. The patient's functional ability including ADL's, fall risks, home safety risks and hearing or visual  impairment.  5. Diet and physical activities  6. Evidence for depression or mood disorders  The patients weight, height, BMI and visual acuity have been recorded in the chart  I have made referrals, counseling and provided education to the patient based review of the above and I have provided the pt with a written personalized care plan for preventive services.   On disability for multiple medical issue.. For past 7 years.   Hypertension: Well controlled on HCTZ and metoprolol.  BP Readings from Last 3 Encounters:  04/03/13 120/72  02/13/13 130/86  04/02/12 130/78  Using medication without problems or lightheadedness:  none Chest pain with exertion: None  Edema:None  Short of breath:None Average home BPs: daughter checks at home.  Other issues:   Walking 30 min a day. Eating a lot sweets.  Hypothyroid:  Lab Results  Component Value Date   TSH 0.32* 03/27/2013   B12 deficiency: due for injection... Has not received in a while given shortage. She has been using OTC 100 mcg daily. She is doing well on the oral medication.  2012 she was having numbness in feet and DOE... Refused nerve conduction and ECHO given on 2 week follow up she was feeling better.   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 Dr. Judson Roch.  Pain is severe in that joint.  Labs reviewed for cholesterol and DM recheck..   LDL at goal. Lab Results  Component Value Date   CHOL 159 03/27/2013   HDL 40.90 03/27/2013   LDLCALC 100* 03/27/2013   TRIG 92.0 03/27/2013   CHOLHDL 4 03/27/2013      Review of Systems  Constitutional: Positive for fatigue.  HENT:  Negative for ear pain.  Eyes: Negative for pain.  Respiratory: Negative for shortness of breath and wheezing.  Cardiovascular: Negative for chest pain, palpitations and leg swelling.  Gastrointestinal: Negative for abdominal pain.  Genitourinary: Negative for dysuria.  Musculoskeletal: Negative for back pain.  Objective:   Physical Exam  Constitutional: Vital signs are normal. She appears well-developed and well-nourished. She is cooperative. Non-toxic appearance. She does not appear ill. No distress.  overweight  HENT:  Head: Normocephalic.  Right Ear: Hearing, tympanic membrane, external ear and ear canal normal.  Left Ear: Hearing, tympanic membrane, external ear and ear canal normal.  Nose: Nose normal.  Eyes: Conjunctivae normal, EOM and lids are normal. Pupils are equal, round, and reactive to light. No foreign bodies found.  Neck: Trachea normal and normal range of motion. Neck supple. Carotid bruit is not present. No mass and no thyromegaly present.  Cardiovascular: Normal rate, regular rhythm, S1 normal, S2 normal, normal heart sounds and intact distal pulses. Exam reveals no gallop.  No murmur heard.  Pulmonary/Chest: Effort normal and breath sounds normal. No respiratory distress. She has no wheezes. She has no rhonchi. She has no rales.  Abdominal: Soft. Normal appearance and bowel sounds are normal. She exhibits no distension, no fluid wave, no abdominal bruit and no mass. There is no hepatosplenomegaly. There is no tenderness. There is no rebound, no guarding and no CVA tenderness. No hernia.  Genitourinary: Vagina normal and uterus normal. No breast  swelling, tenderness, discharge or bleeding. Pelvic exam was performed with patient prone. There is no rash, tenderness or lesion on the right labia. There is no rash, tenderness or lesion on the left labia. Uterus is not enlarged and not tender. Right adnexum displays no mass, no tenderness and no fullness. Left adnexum displays no  mass, no tenderness and no fullness.  No PAP Lymphadenopathy:  She has no cervical adenopathy.  She has no axillary adenopathy.  Neurological: She is alert. She has normal strength. No cranial nerve deficit or sensory deficit.  Skin: Skin is warm, dry and intact. No rash noted.  Psychiatric: Her speech is normal and behavior is normal. Judgment normal. Her mood appears not anxious. Cognition and memory are normal. She does exhibit a depressed mood, she does have a very flat affect. Assessment & Plan:   The patient's preventative maintenance and recommended screening tests for an annual wellness exam were reviewed in full today.  Brought up to date unless services declined.  Counselled on the importance of diet, exercise, and its role in overall health and mortality.  The patient's FH and SH was reviewed, including their home life, tobacco status, and drug and alcohol status.   Up to Date with vaccines, getting flu vaccine today.  Due for mammogram.  Last colonoscopy 2003 or 2007, unclear in records, pt does not remember when or where she had it... Next due in 2013 to 2017. Given this we will start doing yearly iFOB.  Pap in  nml 2012. Now on 3 year pap schedule. DVE yearly.  Bone density: no family history known of osteoporosis, no steroids. Nonsmoker

## 2013-04-18 ENCOUNTER — Other Ambulatory Visit: Payer: Self-pay | Admitting: *Deleted

## 2013-04-18 MED ORDER — HYDROCHLOROTHIAZIDE 12.5 MG PO CAPS: ORAL_CAPSULE | ORAL | Status: DC

## 2013-04-18 MED ORDER — MELOXICAM 15 MG PO TABS: ORAL_TABLET | ORAL | Status: DC

## 2013-04-18 NOTE — Telephone Encounter (Signed)
Last OV 04/03/2013.  Ok to refill?

## 2013-05-15 ENCOUNTER — Ambulatory Visit (INDEPENDENT_AMBULATORY_CARE_PROVIDER_SITE_OTHER): Payer: Medicare Other | Admitting: Family Medicine

## 2013-05-15 DIAGNOSIS — R195 Other fecal abnormalities: Secondary | ICD-10-CM

## 2013-05-15 DIAGNOSIS — Z1212 Encounter for screening for malignant neoplasm of rectum: Secondary | ICD-10-CM

## 2013-06-01 ENCOUNTER — Other Ambulatory Visit: Payer: Self-pay | Admitting: Family Medicine

## 2013-06-23 ENCOUNTER — Other Ambulatory Visit: Payer: Self-pay | Admitting: *Deleted

## 2013-06-23 MED ORDER — METOPROLOL SUCCINATE ER 25 MG PO TB24: ORAL_TABLET | ORAL | Status: DC

## 2013-07-16 ENCOUNTER — Other Ambulatory Visit: Payer: Self-pay

## 2013-07-16 DIAGNOSIS — Z1231 Encounter for screening mammogram for malignant neoplasm of breast: Secondary | ICD-10-CM

## 2013-08-05 ENCOUNTER — Ambulatory Visit
Admission: RE | Admit: 2013-08-05 | Discharge: 2013-08-05 | Disposition: A | Discharge status: Home / Self Care | Payer: Medicare HMO | Source: Ambulatory Visit

## 2013-08-05 DIAGNOSIS — Z1231 Encounter for screening mammogram for malignant neoplasm of breast: Secondary | ICD-10-CM

## 2013-08-19 ENCOUNTER — Other Ambulatory Visit: Payer: Self-pay | Admitting: Family Medicine

## 2013-08-19 ENCOUNTER — Other Ambulatory Visit: Payer: Self-pay | Admitting: *Deleted

## 2013-08-19 MED ORDER — LEVOTHYROXINE SODIUM 112 MCG PO TABS: ORAL_TABLET | ORAL | Status: DC

## 2013-08-19 NOTE — Telephone Encounter (Signed)
Last office visit 04/03/2013.  Ok to refill?

## 2013-09-09 ENCOUNTER — Other Ambulatory Visit: Payer: Self-pay | Admitting: Family Medicine

## 2013-09-24 ENCOUNTER — Other Ambulatory Visit: Payer: Self-pay | Admitting: Family Medicine

## 2013-09-24 NOTE — Telephone Encounter (Signed)
Last office visit 04/03/2013.  Ok to refill?

## 2013-09-28 ENCOUNTER — Encounter (HOSPITAL_COMMUNITY): Payer: Self-pay | Admitting: Emergency Medicine

## 2013-09-28 ENCOUNTER — Emergency Department (HOSPITAL_COMMUNITY): Payer: Medicare HMO

## 2013-09-28 ENCOUNTER — Inpatient Hospital Stay (HOSPITAL_COMMUNITY)
Admission: EM | Admit: 2013-09-28 | Discharge: 2013-09-30 | DRG: 639 | Disposition: A | Discharge status: Home / Self Care | Payer: Medicare HMO | Attending: Internal Medicine | Admitting: Internal Medicine

## 2013-09-28 DIAGNOSIS — M1710 Unilateral primary osteoarthritis, unspecified knee: Secondary | ICD-10-CM

## 2013-09-28 DIAGNOSIS — R7303 Prediabetes: Secondary | ICD-10-CM

## 2013-09-28 DIAGNOSIS — E538 Deficiency of other specified B group vitamins: Secondary | ICD-10-CM

## 2013-09-28 DIAGNOSIS — Z833 Family history of diabetes mellitus: Secondary | ICD-10-CM

## 2013-09-28 DIAGNOSIS — E119 Type 2 diabetes mellitus without complications: Secondary | ICD-10-CM

## 2013-09-28 DIAGNOSIS — I10 Essential (primary) hypertension: Secondary | ICD-10-CM

## 2013-09-28 DIAGNOSIS — E1101 Type 2 diabetes mellitus with hyperosmolarity with coma: Principal | ICD-10-CM | POA: Diagnosis present

## 2013-09-28 DIAGNOSIS — G5600 Carpal tunnel syndrome, unspecified upper limb: Secondary | ICD-10-CM

## 2013-09-28 DIAGNOSIS — E1100 Type 2 diabetes mellitus with hyperosmolarity without nonketotic hyperglycemic-hyperosmolar coma (NKHHC): Secondary | ICD-10-CM

## 2013-09-28 DIAGNOSIS — Z79899 Other long term (current) drug therapy: Secondary | ICD-10-CM

## 2013-09-28 DIAGNOSIS — Z8249 Family history of ischemic heart disease and other diseases of the circulatory system: Secondary | ICD-10-CM

## 2013-09-28 DIAGNOSIS — R5383 Other fatigue: Secondary | ICD-10-CM

## 2013-09-28 DIAGNOSIS — N83209 Unspecified ovarian cyst, unspecified side: Secondary | ICD-10-CM

## 2013-09-28 DIAGNOSIS — M19049 Primary osteoarthritis, unspecified hand: Secondary | ICD-10-CM

## 2013-09-28 DIAGNOSIS — R5381 Other malaise: Secondary | ICD-10-CM

## 2013-09-28 DIAGNOSIS — IMO0002 Reserved for concepts with insufficient information to code with codable children: Secondary | ICD-10-CM

## 2013-09-28 DIAGNOSIS — M13139 Monoarthritis, not elsewhere classified, unspecified wrist: Secondary | ICD-10-CM | POA: Diagnosis present

## 2013-09-28 DIAGNOSIS — G589 Mononeuropathy, unspecified: Secondary | ICD-10-CM

## 2013-09-28 DIAGNOSIS — E669 Obesity, unspecified: Secondary | ICD-10-CM

## 2013-09-28 DIAGNOSIS — Z7982 Long term (current) use of aspirin: Secondary | ICD-10-CM

## 2013-09-28 DIAGNOSIS — E039 Hypothyroidism, unspecified: Secondary | ICD-10-CM

## 2013-09-28 LAB — GLUCOSE, CAPILLARY
GLUCOSE-CAPILLARY: 182 mg/dL — AB (ref 70–99)
GLUCOSE-CAPILLARY: 148 mg/dL — AB (ref 70–99)
GLUCOSE-CAPILLARY: 156 mg/dL — AB (ref 70–99)
GLUCOSE-CAPILLARY: 130 mg/dL — AB (ref 70–99)
Glucose-Capillary: 278 mg/dL — ABNORMAL HIGH (ref 70–99)
Glucose-Capillary: 238 mg/dL — ABNORMAL HIGH (ref 70–99)
Glucose-Capillary: 144 mg/dL — ABNORMAL HIGH (ref 70–99)
Glucose-Capillary: 140 mg/dL — ABNORMAL HIGH (ref 70–99)
Glucose-Capillary: 143 mg/dL — ABNORMAL HIGH (ref 70–99)
Glucose-Capillary: 128 mg/dL — ABNORMAL HIGH (ref 70–99)

## 2013-09-28 LAB — BASIC METABOLIC PANEL
BUN: 19 mg/dL (ref 6–23)
BUN: 14 mg/dL (ref 6–23)
BUN: 11 mg/dL (ref 6–23)
CALCIUM: 8.1 mg/dL — AB (ref 8.4–10.5)
CHLORIDE: 104 meq/L (ref 96–112)
CHLORIDE: 103 meq/L (ref 96–112)
CO2: 25 mEq/L (ref 19–32)
CO2: 21 mEq/L (ref 19–32)
CO2: 21 meq/L (ref 19–32)
Calcium: 9.1 mg/dL (ref 8.4–10.5)
Calcium: 8.5 mg/dL (ref 8.4–10.5)
Chloride: 94 mEq/L — ABNORMAL LOW (ref 96–112)
Creatinine, Ser: 0.87 mg/dL (ref 0.50–1.10)
Creatinine, Ser: 0.71 mg/dL (ref 0.50–1.10)
Creatinine, Ser: 0.68 mg/dL (ref 0.50–1.10)
GFR calc Af Amer: >90 mL/min (ref >90)
GFR calc non Af Amer: 72 mL/min — ABNORMAL LOW (ref >90)
GFR calc non Af Amer: >90 mL/min (ref >90)
GFR calc non Af Amer: >90 mL/min (ref >90)
GFR, EST AFRICAN AMERICAN: 83 mL/min — AB (ref >90)
Glucose, Bld: 573 mg/dL (ref 70–99)
Glucose, Bld: 216 mg/dL — ABNORMAL HIGH (ref 70–99)
Glucose, Bld: 132 mg/dL — ABNORMAL HIGH (ref 70–99)
POTASSIUM: 4 meq/L (ref 3.7–5.3)
Potassium: 3.6 mEq/L — ABNORMAL LOW (ref 3.7–5.3)
Potassium: 3.2 mEq/L — ABNORMAL LOW (ref 3.7–5.3)
SODIUM: 134 meq/L — AB (ref 137–147)
SODIUM: 141 meq/L (ref 137–147)
SODIUM: 137 meq/L (ref 137–147)

## 2013-09-28 LAB — COMPREHENSIVE METABOLIC PANEL
ALBUMIN: 4.3 g/dL (ref 3.5–5.2)
ALT: 31 U/L (ref 0–35)
AST: 20 U/L (ref 0–37)
Alkaline Phosphatase: 115 U/L (ref 39–117)
BILIRUBIN TOTAL: 0.4 mg/dL (ref 0.3–1.2)
BUN: 20 mg/dL (ref 6–23)
CHLORIDE: 87 meq/L — AB (ref 96–112)
CO2: 24 mEq/L (ref 19–32)
Calcium: 10.5 mg/dL (ref 8.4–10.5)
Creatinine, Ser: 0.92 mg/dL (ref 0.50–1.10)
GFR calc Af Amer: 77 mL/min — ABNORMAL LOW (ref >90)
GFR calc non Af Amer: 67 mL/min — ABNORMAL LOW (ref >90)
Glucose, Bld: 507 mg/dL — ABNORMAL HIGH (ref 70–99)
Potassium: 3.9 mEq/L (ref 3.7–5.3)
Sodium: 133 mEq/L — ABNORMAL LOW (ref 137–147)
Total Protein: 8.9 g/dL — ABNORMAL HIGH (ref 6.0–8.3)

## 2013-09-28 LAB — HEMOGLOBIN A1C
Hgb A1c MFr Bld: 9.6 % — ABNORMAL HIGH (ref <5.7)
MEAN PLASMA GLUCOSE: 229 mg/dL — AB (ref <117)

## 2013-09-28 LAB — CBC
HEMATOCRIT: 35.7 % — AB (ref 36.0–46.0)
Hemoglobin: 12.7 g/dL (ref 12.0–15.0)
MCH: 27.5 pg (ref 26.0–34.0)
MCHC: 35.6 g/dL (ref 30.0–36.0)
MCV: 77.3 fL — ABNORMAL LOW (ref 78.0–100.0)
PLATELETS: 245 10*3/uL (ref 150–400)
RBC: 4.62 MIL/uL (ref 3.87–5.11)
RDW: 14.1 % (ref 11.5–15.5)
WBC: 8.1 10*3/uL (ref 4.0–10.5)

## 2013-09-28 LAB — CBC WITH DIFFERENTIAL/PLATELET
BASOS PCT: 1 % (ref 0–1)
Basophils Absolute: 0 10*3/uL (ref 0.0–0.1)
EOS ABS: 0.1 10*3/uL (ref 0.0–0.7)
EOS PCT: 2 % (ref 0–5)
HEMATOCRIT: 40.6 % (ref 36.0–46.0)
Hemoglobin: 14.6 g/dL (ref 12.0–15.0)
Lymphocytes Relative: 30 % (ref 12–46)
Lymphs Abs: 2.1 10*3/uL (ref 0.7–4.0)
MCH: 27.7 pg (ref 26.0–34.0)
MCHC: 36 g/dL (ref 30.0–36.0)
MCV: 76.9 fL — ABNORMAL LOW (ref 78.0–100.0)
MONO ABS: 0.7 10*3/uL (ref 0.1–1.0)
MONOS PCT: 10 % (ref 3–12)
NEUTROS ABS: 4 10*3/uL (ref 1.7–7.7)
Neutrophils Relative %: 57 % (ref 43–77)
Platelets: 278 10*3/uL (ref 150–400)
RBC: 5.28 MIL/uL — ABNORMAL HIGH (ref 3.87–5.11)
RDW: 14.2 % (ref 11.5–15.5)
WBC: 6.9 10*3/uL (ref 4.0–10.5)

## 2013-09-28 LAB — MRSA PCR SCREENING: MRSA BY PCR: NEGATIVE

## 2013-09-28 LAB — I-STAT CG4 LACTIC ACID, ED: Lactic Acid, Venous: 2.41 mmol/L — ABNORMAL HIGH (ref 0.5–2.2)

## 2013-09-28 LAB — I-STAT TROPONIN, ED: TROPONIN I, POC: 0 ng/mL (ref 0.00–0.08)

## 2013-09-28 LAB — URINALYSIS, ROUTINE W REFLEX MICROSCOPIC
BILIRUBIN URINE: NEGATIVE
Glucose, UA: >1000 mg/dL — AB
Hgb urine dipstick: NEGATIVE
Ketones, ur: 15 mg/dL — AB
LEUKOCYTES UA: NEGATIVE
NITRITE: NEGATIVE
PH: 5.5 (ref 5.0–8.0)
Protein, ur: NEGATIVE mg/dL
Specific Gravity, Urine: 1.031 — ABNORMAL HIGH (ref 1.005–1.030)
Urobilinogen, UA: 0.2 mg/dL (ref 0.0–1.0)

## 2013-09-28 LAB — TSH: TSH: 0.868 u[IU]/mL (ref 0.350–4.500)

## 2013-09-28 LAB — CBG MONITORING, ED
GLUCOSE-CAPILLARY: 595 mg/dL — AB (ref 70–99)
Glucose-Capillary: 552 mg/dL (ref 70–99)
Glucose-Capillary: 439 mg/dL — ABNORMAL HIGH (ref 70–99)

## 2013-09-28 LAB — URINE MICROSCOPIC-ADD ON

## 2013-09-28 MED ORDER — POTASSIUM CHLORIDE 10 MEQ/100ML IV SOLN
10.0000 meq | INTRAVENOUS | Status: AC
  Administered 2013-09-28 (×2): 10 meq via INTRAVENOUS
  Filled 2013-09-28 (×2): qty 100

## 2013-09-28 MED ORDER — INSULIN GLARGINE 100 UNIT/ML ~~LOC~~ SOLN
5.0000 [IU] | Freq: Every day | SUBCUTANEOUS | Status: DC
  Administered 2013-09-28: 5 [IU] via SUBCUTANEOUS
  Filled 2013-09-28 (×2): qty 0.05

## 2013-09-28 MED ORDER — METOPROLOL SUCCINATE ER 25 MG PO TB24
25.0000 mg | ORAL_TABLET | Freq: Every day | ORAL | Status: DC
  Administered 2013-09-28 – 2013-09-30 (×3): 25 mg via ORAL
  Filled 2013-09-28 (×3): qty 1

## 2013-09-28 MED ORDER — LEVOTHYROXINE SODIUM 100 MCG PO TABS
100.0000 ug | ORAL_TABLET | ORAL | Status: DC
  Administered 2013-09-29: 100 ug via ORAL
  Filled 2013-09-28: qty 1

## 2013-09-28 MED ORDER — INSULIN ASPART 100 UNIT/ML ~~LOC~~ SOLN
0.0000 [IU] | Freq: Three times a day (TID) | SUBCUTANEOUS | Status: DC
  Administered 2013-09-29 (×2): 7 [IU] via SUBCUTANEOUS

## 2013-09-28 MED ORDER — PNEUMOCOCCAL VAC POLYVALENT 25 MCG/0.5ML IJ INJ
0.5000 mL | INJECTION | INTRAMUSCULAR | Status: AC
  Administered 2013-09-29: 0.5 mL via INTRAMUSCULAR
  Filled 2013-09-28: qty 0.5

## 2013-09-28 MED ORDER — ASPIRIN 81 MG PO CHEW
81.0000 mg | CHEWABLE_TABLET | Freq: Every day | ORAL | Status: DC
  Administered 2013-09-29 – 2013-09-30 (×3): 81 mg via ORAL
  Filled 2013-09-28 (×2): qty 1

## 2013-09-28 MED ORDER — SODIUM CHLORIDE 0.9 % IV SOLN: INTRAVENOUS | Status: DC

## 2013-09-28 MED ORDER — SODIUM CHLORIDE 0.9 % IV BOLUS (SEPSIS)
1000.0000 mL | Freq: Once | INTRAVENOUS | Status: AC
  Administered 2013-09-28: 1000 mL via INTRAVENOUS

## 2013-09-28 MED ORDER — CYANOCOBALAMIN 500 MCG PO TABS
500.0000 ug | ORAL_TABLET | Freq: Every day | ORAL | Status: DC
  Administered 2013-09-28: 16:00:00 via ORAL
  Administered 2013-09-29 – 2013-09-30 (×2): 500 ug via ORAL
  Filled 2013-09-28 (×3): qty 1

## 2013-09-28 MED ORDER — HYDROCHLOROTHIAZIDE 12.5 MG PO CAPS
12.5000 mg | ORAL_CAPSULE | Freq: Every day | ORAL | Status: DC
  Administered 2013-09-29 – 2013-09-30 (×3): 12.5 mg via ORAL
  Filled 2013-09-28 (×2): qty 1

## 2013-09-28 MED ORDER — DEXTROSE 50 % IV SOLN: 25.0000 mL | INTRAVENOUS | Status: DC | PRN

## 2013-09-28 MED ORDER — SODIUM CHLORIDE 0.9 % IV SOLN: INTRAVENOUS | Status: AC

## 2013-09-28 MED ORDER — ONE-DAILY MULTI VITAMINS PO TABS: 1.0000 | ORAL_TABLET | Freq: Every day | ORAL | Status: DC

## 2013-09-28 MED ORDER — ADULT MULTIVITAMIN W/MINERALS CH
1.0000 | ORAL_TABLET | Freq: Every day | ORAL | Status: DC
  Administered 2013-09-28 – 2013-09-30 (×3): 1 via ORAL
  Filled 2013-09-28 (×3): qty 1

## 2013-09-28 MED ORDER — SODIUM CHLORIDE 0.9 % IV SOLN
INTRAVENOUS | Status: DC
  Administered 2013-09-28: 5.4 [IU]/h via INTRAVENOUS
  Filled 2013-09-28: qty 1

## 2013-09-28 MED ORDER — INSULIN ASPART 100 UNIT/ML ~~LOC~~ SOLN: 0.0000 [IU] | Freq: Every day | SUBCUTANEOUS | Status: DC

## 2013-09-28 MED ORDER — ENOXAPARIN SODIUM 40 MG/0.4ML ~~LOC~~ SOLN
40.0000 mg | SUBCUTANEOUS | Status: DC
  Administered 2013-09-28 – 2013-09-29 (×2): 40 mg via SUBCUTANEOUS
  Filled 2013-09-28 (×3): qty 0.4

## 2013-09-28 MED ORDER — SODIUM CHLORIDE 0.9 % IV SOLN
INTRAVENOUS | Status: DC
  Administered 2013-09-29 (×2): via INTRAVENOUS

## 2013-09-28 MED ORDER — LEVOTHYROXINE SODIUM 112 MCG PO TABS
112.0000 ug | ORAL_TABLET | ORAL | Status: DC
  Administered 2013-09-28 – 2013-09-30 (×2): 112 ug via ORAL
  Filled 2013-09-28 (×2): qty 1

## 2013-09-28 MED ORDER — DEXTROSE-NACL 5-0.45 % IV SOLN
INTRAVENOUS | Status: DC
  Administered 2013-09-28: 15:00:00 via INTRAVENOUS

## 2013-09-28 NOTE — H&P (Signed)
History and Physical  Victoria [name] DXI:[id number] DOB: [birth year] DOA: 09/28/2013   PCP: Eliezer Lofts, MD   Chief Complaint: Generalized weakness, polydipsia, polyuria  HPI:  58 year old female with a history of impaired glucose tolerance, hypertension, and hypothyroidism presents with three-day history of generalized weakness, polydipsia, and polyuria. The patient has been in her usual state of health without any major complaints. She denies any fevers, chills, chest discomfort, shortness of breath, nausea, vomiting, diarrhea, abdominal pain, dysuria, hematuria, headaches, dizziness. The patient states that she was never told that she had diabetes. However review of the medical record revealed hemoglobin A1c of 6.1 on 03/27/2013. The patient has never been on any agents for diabetes mellitus.  In the ED, the patient was noted to have serum glucose of 507. She had a BMP showing sodium 133, bicarbonate 24. Hepatic panel was unremarkable. CBC was unremarkable. Lactic acid was 2.41. EKG showed sinus rhythm without any ST or T wave changes. The patient was started on IV insulin and given 2 L normal saline. Chest x-ray was unremarkable. Assessment/Plan: Hyperosmolar nonketotic state -Patient had anion gap 22 with bicarbonate of 24 on BMP -Continue IV fluids -Continue IV insulin until the patient's anion gap improves -Hemoglobin A1c Diabetes mellitus type 2 -Hemoglobin A1c -Ultimately will  transition to subcutaneous insulin -Consult diabetic educator to provide education Pseudohyponatremia -due to hyperglycemia -continue IVF Hypertension -Continue HCTZ and metoprolol succinate Hypothyroidism -Continue home dose of Synthroid -Check TSH         Past Medical History  Diagnosis Date  . Other B-complex deficiencies   . Obesity, unspecified   . Jaundice, unspecified, not of newborn     Scleral icterus since age 54  . Carpal tunnel syndrome   . Unspecified monoarthritis, forearm    . Unspecified hypothyroidism   . Unspecified essential hypertension    Past Surgical History  Procedure Laterality Date  . Cholecystectomy    . Cardiac catheterization     Social History:  reports that she has never smoked. She has never used smokeless tobacco. She reports that she does not drink alcohol or use illicit drugs.   Family History  Problem Relation Age of Onset  . Hypertension Mother   . Diabetes Father      No Known Allergies    Prior to Admission medications   Medication Sig Start Date End Date Taking? Authorizing Provider  aspirin 81 MG chewable tablet Chew 81 mg by mouth daily.   Yes Historical Provider, MD  Calcium Carb-Cholecalciferol (CALCIUM 600+D3) 600-800 MG-UNIT TABS Take 1 tablet by mouth daily.   Yes Historical Provider, MD  cyanocobalamin (,VITAMIN B-12,) 1000 MCG/ML injection Inject 1,000 mcg into the muscle every 30 (thirty) days.   Yes Historical Provider, MD  cyanocobalamin (V-R VITAMIN B-12) 500 MCG tablet Take 500 mcg by mouth daily.   Yes Historical Provider, MD  hydrochlorothiazide (MICROZIDE) 12.5 MG capsule Take 12.5 mg by mouth daily.   Yes Historical Provider, MD  levothyroxine (SYNTHROID, LEVOTHROID) 100 MCG tablet Take 100 mcg by mouth every other day.   Yes Historical Provider, MD  levothyroxine (SYNTHROID, LEVOTHROID) 112 MCG tablet Take 112 mcg by mouth every other day.   Yes Historical Provider, MD  meloxicam (MOBIC) 15 MG tablet Take 15 mg by mouth daily.   Yes Historical Provider, MD  metoprolol succinate (TOPROL-XL) 25 MG 24 hr tablet Take 25 mg by mouth daily.   Yes Historical Provider, MD  Multiple Vitamin (MULTIVITAMIN) tablet Take 1  tablet by mouth daily.    Yes Historical Provider, MD    Review of Systems:  Constitutional:  No weight loss, night sweats,fatigue.  Head&Eyes: No headache.  No vision loss.  No eye pain or scotoma ENT:  No Difficulty swallowing,Tooth/dental problems,Sore throat,   Cardio-vascular:  No chest  pain, Orthopnea, PND, swelling in lower extremities,  dizziness, palpitations  GI:  No  abdominal pain, nausea, vomiting, diarrhea, loss of appetite, hematochezia, melena, heartburn, indigestion, Resp:  No shortness of breath with exertion or at rest. No cough. No coughing up of blood .No wheezing.No chest wall deformity  Skin:  no rash or lesions.  GU:  no dysuria, change in color of urine.  No flank pain.  Musculoskeletal:  No joint swelling. No decreased range of motion. No back pain.  Psych:  No change in mood or affect. No depression or anxiety. Neurologic: No headache, no dysesthesia, no focal weakness, no vision loss. No syncope  Physical Exam: Filed Vitals:   09/28/13 0900 09/28/13 0930 09/28/13 0945 09/28/13 1000  BP: 142/66 138/81 148/59 137/70  Pulse: 81 78 82 79  Temp:      TempSrc:      Resp: 17     SpO2: 99% 98% 98% 99%   General:  A&O x 3, NAD, nontoxic, pleasant/cooperative Head/Eye: No conjunctival hemorrhage, no icterus, [place]/AT, No nystagmus ENT:  No icterus,  No thrush, good dentition, no pharyngeal exudate Neck:  No masses, no lymphadenpathy, no bruits CV:  RRR, no rub, no gallop, no S3 Lung:  CTAB, good air movement, no wheeze, no rhonchi Abdomen: soft/NT, +BS, nondistended, no peritoneal signs Ext: No cyanosis, No rashes, No petechiae, No lymphangitis, trace LE edema Neuro: CNII-XII intact, strength 4/5 in bilateral upper and lower extremities, no dysmetria  Labs on Admission:  Basic Metabolic Panel:  Recent Labs Lab 09/28/13 0818  NA 133*  K 3.9  CL 87*  CO2 24  GLUCOSE 507*  BUN 20  CREATININE 0.92  CALCIUM 10.5   Liver Function Tests:  Recent Labs Lab 09/28/13 0818  AST 20  ALT 31  ALKPHOS 115  BILITOT 0.4  PROT 8.9*  ALBUMIN 4.3   No results found for this basename: LIPASE, AMYLASE,  in the last 168 hours No results found for this basename: AMMONIA,  in the last 168 hours CBC:  Recent Labs Lab 09/28/13 0818  WBC 6.9    NEUTROABS 4.0  HGB 14.6  HCT 40.6  MCV 76.9*  PLT 278   Cardiac Enzymes: No results found for this basename: CKTOTAL, CKMB, CKMBINDEX, TROPONINI,  in the last 168 hours BNP: No components found with this basename: POCBNP,  CBG:  Recent Labs Lab 09/28/13 1009  GLUCAP 595*    Radiological Exams on Admission: Dg Chest 2 View  09/28/2013   CLINICAL DATA:  Weakness  EXAM: CHEST  2 VIEW  COMPARISON:  02/10/2011  FINDINGS: Cardiac shadow is within normal limits. The lungs are clear bilaterally. Biapical scarring is seen. No acute bony abnormality is noted.  IMPRESSION: No acute abnormality seen.   Electronically Signed   By: Inez Catalina M.D.   On: 09/28/2013 09:32    EKG: Independently reviewed. Sinus rhythm, no ST to T wave change    Time spent:60 minutes Code Status:   FULL Family Communication:   Daughter at bedside   Victoria Dioguardi, DO  [facility] Pager [phone]  If 7PM-7AM, please contact night-coverage [email or web address] Password [facility] 09/28/2013, 10:34 AM

## 2013-09-28 NOTE — Progress Notes (Signed)
Paged phlebotomy to come by. Pt behind of scheduled BMETS.  Will continue to monitor

## 2013-09-28 NOTE — ED Provider Notes (Signed)
Medical screening examination/treatment/procedure(s) were conducted as a shared visit with non-physician practitioner(s) and myself.  I personally evaluated the patient during the encounter.   EKG Interpretation   Date/Time:  Sunday September 28 2013 08:49:26 EDT Ventricular Rate:  80 PR Interval:  183 QRS Duration: 79 QT Interval:  381 QTC Calculation: 439 R Axis:   -53 Text Interpretation:  Sinus rhythm LAD, consider left anterior fascicular  block Left ventricular hypertrophy Borderline T abnormalities, inferior  leads no prior for comparison Confirmed by Dina Rich  MD, COURTNEY ([id number]) on  09/28/2013 8:52:31 AM        Merryl Hacker, MD 09/28/13 [phone]

## 2013-09-28 NOTE — ED Provider Notes (Signed)
CSN: [id number]     Arrival date & time 09/28/13  0751 History   First MD Initiated Contact with Patient 09/28/13 [phone]     Chief Complaint  Patient presents with  . Weakness     (Consider location/radiation/quality/duration/timing/severity/associated sxs/prior Treatment) HPI Comments: Patient is a 58 year old female history of hypertension and hypothyroidism who presents today with generalized weakness. She reports over the past 2 days this generalized weakness has worsened. She has mild associated shortness of breath. The pain is not pleuritic in nature. She has not had a bowel movement in the past 2 days she she has a bowel movement every day. She has had polydipsia and polyuria. No polyphagia. She denies any other symptoms. She denies any pain, paresthesias, numbness, headache, fever, chills, nausea, vomiting, abdominal pain, diarrhea, chest pain.  The history is provided by the patient. No language interpreter was used.    Past Medical History  Diagnosis Date  . Other B-complex deficiencies   . Obesity, unspecified   . Jaundice, unspecified, not of newborn     Scleral icterus since age 60  . Carpal tunnel syndrome   . Unspecified monoarthritis, forearm   . Unspecified hypothyroidism   . Unspecified essential hypertension    Past Surgical History  Procedure Laterality Date  . Cholecystectomy    . Cardiac catheterization     Family History  Problem Relation Age of Onset  . Hypertension Mother   . Diabetes Father    History  Substance Use Topics  . Smoking status: Never Smoker   . Smokeless tobacco: Never Used  . Alcohol Use: No   OB History   Grav Para Term Preterm Abortions TAB SAB Ect Mult Living                 Review of Systems  Constitutional: Positive for appetite change (decreased appetite). Negative for fever and chills.  Respiratory: Positive for shortness of breath.   Cardiovascular: Negative for chest pain.  Gastrointestinal: Negative for nausea,  vomiting and abdominal pain.  Endocrine: Positive for polydipsia and polyuria. Negative for polyphagia.  Genitourinary: Negative for dysuria.  Neurological: Positive for weakness.  All other systems reviewed and are negative.      Allergies  Review of patient's allergies indicates no known allergies.  Home Medications   Current Outpatient Rx  Name  Route  Sig  Dispense  Refill  . aspirin 81 MG tablet   Oral   Take 81 mg by mouth daily.           . Calcium Carbonate (CALCARB 600) 1500 MG TABS   Oral   Take 1 tablet by mouth daily.           . cyanocobalamin (,VITAMIN B-12,) 1000 MCG/ML injection   Intramuscular   Inject 1 mL (1,000 mcg total) into the muscle once.   1 mL   0   . cyanocobalamin (V-R VITAMIN B-12) 500 MCG tablet   Oral   Take 500 mcg by mouth daily.         . cyclobenzaprine (FLEXERIL) 10 MG tablet      TAKE 1 TABLET BY MOUTH THREE TIMES DAILY AS NEEDED FOR MUSCLE SPASMS   45 tablet   1   . gabapentin (NEURONTIN) 100 MG capsule   Oral   Take 1 capsule (100 mg total) by mouth 2 (two) times daily.   60 capsule   1   . hydrochlorothiazide (MICROZIDE) 12.5 MG capsule      TAKE 1  CAPSULE BY MOUTH EVERY DAY   30 capsule   5   . levothyroxine (SYNTHROID, LEVOTHROID) 100 MCG tablet      TAKE 1 TABLET BY MOUTH EVERY OTHER DAY   30 tablet   3   . levothyroxine (SYNTHROID, LEVOTHROID) 112 MCG tablet      TAKE 1 TABLET BY MOUTH EVERY OTHER DAY   30 tablet   5   . meloxicam (MOBIC) 15 MG tablet      TAKE 1 TABLET BY MOUTH DAILY   30 tablet   0   . metoprolol succinate (TOPROL-XL) 25 MG 24 hr tablet      TAKE 1 TABLET BY MOUTH ONCE A DAY   30 tablet   5   . Multiple Vitamin (MULTIVITAMIN) tablet   Oral   Take 1 tablet by mouth daily.           . traMADol (ULTRAM) 50 MG tablet   Oral   Take 50 mg by mouth every 4 (four) hours as needed.          BP 137/70  Pulse 79  Temp(Src) 98 F (36.7 C) (Oral)  Resp 17  SpO2  99% Physical Exam  Nursing note and vitals reviewed. Constitutional: She is oriented to person, place, and time. She appears well-developed and well-nourished. No distress.  HENT:  Head: Normocephalic and atraumatic.  Right Ear: External ear normal.  Left Ear: External ear normal.  Nose: Nose normal.  Mouth/Throat: Uvula is midline and oropharynx is clear and moist. Mucous membranes are dry.  Eyes: Conjunctivae and EOM are normal. Pupils are equal, round, and reactive to light.  Neck: Normal range of motion.  Cardiovascular: Normal rate, regular rhythm and normal heart sounds.   Pulmonary/Chest: Effort normal and breath sounds normal. No stridor. No respiratory distress. She has no wheezes. She has no rales.  Abdominal: Soft. She exhibits no distension.  Musculoskeletal: Normal range of motion.  Neurological: She is alert and oriented to person, place, and time. She has normal strength.  Grip strength 5/5 bilaterally Decreased strength in lower extremities bilaterally. Patient reports this is chronic.   Skin: Skin is warm and dry. She is not diaphoretic. No erythema.  Psychiatric: She has a normal mood and affect. Her behavior is normal.    ED Course  Procedures (including critical care time) Labs Review Labs Reviewed  CBC WITH DIFFERENTIAL - Abnormal; Notable for the following:    RBC 5.28 (*)    MCV 76.9 (*)    All other components within normal limits  COMPREHENSIVE METABOLIC PANEL - Abnormal; Notable for the following:    Sodium 133 (*)    Chloride 87 (*)    Glucose, Bld 507 (*)    Total Protein 8.9 (*)    GFR calc non Af Amer 67 (*)    GFR calc Af Amer 77 (*)    All other components within normal limits  URINALYSIS, ROUTINE W REFLEX MICROSCOPIC - Abnormal; Notable for the following:    Specific Gravity, Urine 1.031 (*)    Glucose, UA >1000 (*)    Ketones, ur 15 (*)    All other components within normal limits  URINE MICROSCOPIC-ADD ON - Abnormal; Notable for the  following:    Squamous Epithelial / LPF FEW (*)    All other components within normal limits  I-STAT CG4 LACTIC ACID, ED - Abnormal; Notable for the following:    Lactic Acid, Venous 2.41 (*)    All other components  within normal limits  CBG MONITORING, ED - Abnormal; Notable for the following:    Glucose-Capillary 595 (*)    All other components within normal limits  URINE CULTURE  BASIC METABOLIC PANEL  BASIC METABOLIC PANEL  BASIC METABOLIC PANEL  BASIC METABOLIC PANEL  CBC  HEMOGLOBIN A1C  TSH  URINALYSIS, ROUTINE W REFLEX MICROSCOPIC  I-STAT [place], ED   Imaging Review Dg Chest 2 View  09/28/2013   CLINICAL DATA:  Weakness  EXAM: CHEST  2 VIEW  COMPARISON:  02/10/2011  FINDINGS: Cardiac shadow is within normal limits. The lungs are clear bilaterally. Biapical scarring is seen. No acute bony abnormality is noted.  IMPRESSION: No acute abnormality seen.   Electronically Signed   By: Inez Catalina M.D.   On: 09/28/2013 09:32     EKG Interpretation   Date/Time:  Sunday September 28 2013 08:49:26 EDT Ventricular Rate:  80 PR Interval:  183 QRS Duration: 79 QT Interval:  381 QTC Calculation: 439 R Axis:   -53 Text Interpretation:  Sinus rhythm LAD, consider left anterior fascicular  block Left ventricular hypertrophy Borderline T abnormalities, inferior  leads no prior for comparison Confirmed by HORTON  MD, Sharon ([id number]) on  09/28/2013 8:52:31 AM      MDM   Final diagnoses:  Diabetes    Patient presents to ED for evaluation of generalized weakness, polydipsia, and polyuria. Patient found to have glucose of 507. No history of diabetes. Patient started on glucose stabilizer and admitted to medicine for further workup. Admission is appreciated. Patient is hemodynamically stable. Discussed case with Dr. Dina Rich who agrees with plan. Patient / Family / Caregiver informed of clinical course, understand medical decision-making process, and agree with plan.    Elwyn Lade, PA-C 09/28/13 1039

## 2013-09-28 NOTE — Progress Notes (Signed)
Pt arrived from the ED on glucostablizer. VSS, changed over to monitor and oriented to unit and routine, call bell within reach, family out to lunch will return, checked CBG 278 at 1310 changed insulin gtt to 4.4, IVF running at 111ml.hr per MD order, NPO status-pt already requesting something to eat. No complaints of pain.  Will continue to monitor.   elink and CMT notified of arrival.

## 2013-09-28 NOTE — ED Notes (Signed)
Patient presents to ED via POV from home with complaints of weakness all over for 2 days. Patient reports she also has not had a BM in 2 days and she normally goes twice a day.

## 2013-09-29 LAB — URINALYSIS, ROUTINE W REFLEX MICROSCOPIC
Bilirubin Urine: NEGATIVE
Glucose, UA: >1000 mg/dL — AB
Hgb urine dipstick: NEGATIVE
KETONES UR: NEGATIVE mg/dL
LEUKOCYTES UA: NEGATIVE
NITRITE: NEGATIVE
Protein, ur: NEGATIVE mg/dL
Specific Gravity, Urine: 1.017 (ref 1.005–1.030)
UROBILINOGEN UA: 0.2 mg/dL (ref 0.0–1.0)
pH: 6 (ref 5.0–8.0)

## 2013-09-29 LAB — GLUCOSE, CAPILLARY
GLUCOSE-CAPILLARY: 290 mg/dL — AB (ref 70–99)
Glucose-Capillary: 165 mg/dL — ABNORMAL HIGH (ref 70–99)
Glucose-Capillary: 248 mg/dL — ABNORMAL HIGH (ref 70–99)
Glucose-Capillary: 293 mg/dL — ABNORMAL HIGH (ref 70–99)
Glucose-Capillary: 306 mg/dL — ABNORMAL HIGH (ref 70–99)
Glucose-Capillary: 339 mg/dL — ABNORMAL HIGH (ref 70–99)

## 2013-09-29 LAB — URINE MICROSCOPIC-ADD ON

## 2013-09-29 LAB — BASIC METABOLIC PANEL
BUN: 9 mg/dL (ref 6–23)
CALCIUM: 8.5 mg/dL (ref 8.4–10.5)
CO2: 21 mEq/L (ref 19–32)
Chloride: 104 mEq/L (ref 96–112)
Creatinine, Ser: 0.76 mg/dL (ref 0.50–1.10)
GFR calc Af Amer: >90 mL/min (ref >90)
GLUCOSE: 362 mg/dL — AB (ref 70–99)
Potassium: 3.9 mEq/L (ref 3.7–5.3)
SODIUM: 140 meq/L (ref 137–147)

## 2013-09-29 LAB — URINE CULTURE

## 2013-09-29 MED ORDER — INSULIN ASPART 100 UNIT/ML ~~LOC~~ SOLN
0.0000 [IU] | Freq: Three times a day (TID) | SUBCUTANEOUS | Status: DC
  Administered 2013-09-29: 8 [IU] via SUBCUTANEOUS

## 2013-09-29 MED ORDER — INSULIN GLARGINE 100 UNIT/ML ~~LOC~~ SOLN
20.0000 [IU] | Freq: Every day | SUBCUTANEOUS | Status: DC
  Administered 2013-09-29: 20 [IU] via SUBCUTANEOUS
  Filled 2013-09-29 (×2): qty 0.2

## 2013-09-29 MED ORDER — INSULIN ASPART 100 UNIT/ML ~~LOC~~ SOLN
4.0000 [IU] | Freq: Three times a day (TID) | SUBCUTANEOUS | Status: DC
  Administered 2013-09-29: 4 [IU] via SUBCUTANEOUS

## 2013-09-29 MED ORDER — LIVING WELL WITH DIABETES BOOK
Freq: Once | Status: AC
  Administered 2013-09-29: 16:00:00
  Filled 2013-09-29 (×2): qty 1

## 2013-09-29 MED ORDER — INSULIN ASPART 100 UNIT/ML ~~LOC~~ SOLN
0.0000 [IU] | Freq: Every day | SUBCUTANEOUS | Status: DC
Start: 2013-09-29 — End: 2013-09-30
  Administered 2013-09-29: 2 [IU] via SUBCUTANEOUS
  Administered 2013-09-30: 20 [IU] via SUBCUTANEOUS

## 2013-09-29 NOTE — Progress Notes (Signed)
Inpatient Diabetes Program Recommendations  AACE/ADA: New Consensus Statement on Inpatient Glycemic Control (2013)  Target Ranges:  Prepandial:   less than 140 mg/dL      Peak postprandial:   less than 180 mg/dL (1-2 hours)      Critically ill patients:  140 - 180 mg/dL   Inpatient Diabetes Program Recommendations HgbA1C: =9.6 [facility]patient [facility]   Note: This coordinator met with patient to discuss basic inpatient DM education and offer OP referral to [facility].  Patient education manual Living Well with Diabetes has been ordered and DM videos 501-510 on patient ed network.  Bedside RN to assist patient with videos and insulin admin instruction if patient is to go home on insulin.  Patient is interested in attending the [facility] for OP education.  Referral entered with cosign required. Will follow. Thank you  Raoul Pitch BSN, RN,CDE Inpatient Diabetes Coordinator [phone] (team pager)

## 2013-09-29 NOTE — Progress Notes (Signed)
NURSING PROGRESS NOTE  Victoria [name] [id number] Transfer Data: 09/29/2013 6:30 PM Attending Provider: Orson Eva, MD PCP:Amy Diona Browner, MD Code Status: Full  Victoria [name] is a 58 y.o. female patient transferred from 3S -No acute distress noted.  -No complaints of shortness of breath.  -No complaints of chest pain.   Cardiac Monitoring: Box # 14 in place.  Blood pressure 139/58, pulse 79, temperature 99 F (37.2 C), temperature source Oral, resp. rate 16, height 5\' 2"  (1.575 m), weight 79.2 kg (174 lb 9.7 oz), SpO2 97.00%.   IV Fluids:  IV intact to right forearm   Allergies:  Review of patient's allergies indicates no known allergies.  Past Medical History:   has a past medical history of Other B-complex deficiencies; Obesity, unspecified; Jaundice, unspecified, not of newborn; Carpal tunnel syndrome; Unspecified monoarthritis, forearm; Unspecified hypothyroidism; and Unspecified essential hypertension.  Past Surgical History:   has past surgical history that includes Cholecystectomy and Cardiac catheterization.  Skin: intact  Patient/Family orientated to room. Information packet given to patient/family. Admission inpatient armband information verified with patient/family to include name and date of birth and placed on patient arm. Side rails up x 2, fall assessment and education completed with patient/family. Patient/family able to verbalize understanding of risk associated with falls and verbalized understanding to call for assistance before getting out of bed. Call light within reach. Patient/family able to voice and demonstrate understanding of unit orientation instructions.    Will continue to evaluate and treat per MD orders.  Wallie Renshaw, RN

## 2013-09-29 NOTE — Progress Notes (Signed)
PROGRESS NOTE  Victoria [name] TKZ:[id number] DOB: 11-Dec-1955 DOA: 09/28/2013 PCP: Eliezer Lofts, MD  Assessment/Plan: Hyperosmolar nonketotic state  -Patient had anion gap 22 with bicarbonate of 24 on BMP  -Continue IV fluids  -Transition to subcutaneous insulin -Start Lantus 20 units at bedtime -NovoLog 4 units with meals -Hemoglobin A1c--9.6 Diabetes mellitus type 2  -Hemoglobin A1c--9.6 -Consult dietitian  -Consult diabetic educator to provide education  Pseudohyponatremia  -due to hyperglycemia  -continue IVF  Hypertension  -Continue HCTZ and metoprolol succinate  Hypothyroidism  -Continue home dose of Synthroid  -Check TSH--0.868   Family Communication:   daughter at beside Disposition Plan:   Home when medically stable       Procedures/Studies: Dg Chest 2 View  09/28/2013   CLINICAL DATA:  Weakness  EXAM: CHEST  2 VIEW  COMPARISON:  02/10/2011  FINDINGS: Cardiac shadow is within normal limits. The lungs are clear bilaterally. Biapical scarring is seen. No acute bony abnormality is noted.  IMPRESSION: No acute abnormality seen.   Electronically Signed   By: Inez Catalina M.D.   On: 09/28/2013 09:32         Subjective: Patient denies fevers, chills, headache, chest pain, dyspnea, nausea, vomiting, diarrhea, abdominal pain, dysuria, hematuria   Objective: Filed Vitals:   09/29/13 0706 09/29/13 0707 09/29/13 0708 09/29/13 1100  BP: 122/70     Pulse: 75 74 75   Temp: 98.3 F (36.8 C)   97.9 F (36.6 C)  TempSrc: Oral   Oral  Resp:      Height:      Weight:      SpO2: 100% 98% 99%     Intake/Output Summary (Last 24 hours) at 09/29/13 1217 Last data filed at 09/29/13 0800  Gross per 24 hour  Intake    825 ml  Output    500 ml  Net    325 ml   Weight change:  Exam:   General:  Pt is alert, follows commands appropriately, not in acute distress  HEENT: No icterus, No thrush,  [place]/AT  Cardiovascular: RRR, S1/S2, no rubs, no  gallops  Respiratory: CTA bilaterally, no wheezing, no crackles, no rhonchi  Abdomen: Soft/+BS, non tender, non distended, no guarding  Extremities: 1+LE edema, No lymphangitis, No petechiae, No rashes, no synovitis  Data Reviewed: Basic Metabolic Panel:  Recent Labs Lab 09/28/13 0818 09/28/13 1102 09/28/13 1511 09/28/13 1915 09/29/13 1015  NA 133* 134* 141 137 140  K 3.9 4.0 3.6* 3.2* 3.9  CL 87* 94* 104 103 104  CO2 24 25 21 21 21   GLUCOSE 507* 573* 216* 132* 362*  BUN 20 19 14 11 9   CREATININE 0.92 0.87 0.71 0.68 0.76  CALCIUM 10.5 9.1 8.5 8.1* 8.5   Liver Function Tests:  Recent Labs Lab 09/28/13 0818  AST 20  ALT 31  ALKPHOS 115  BILITOT 0.4  PROT 8.9*  ALBUMIN 4.3   No results found for this basename: LIPASE, AMYLASE,  in the last 168 hours No results found for this basename: AMMONIA,  in the last 168 hours CBC:  Recent Labs Lab 09/28/13 0818 09/28/13 1102  WBC 6.9 8.1  NEUTROABS 4.0  --   HGB 14.6 12.7  HCT 40.6 35.7*  MCV 76.9* 77.3*  PLT 278 245   Cardiac Enzymes: No results found for this basename: CKTOTAL, CKMB, CKMBINDEX, TROPONINI,  in the last 168 hours BNP: No components found with this basename: POCBNP,  CBG:  Recent Labs Lab 09/28/13 2045 09/28/13 2148 09/28/13 2250 09/29/13 0003 09/29/13 0809  GLUCAP 143* 128* 130* 165* 306*    Recent Results (from the past 240 hour(s))  MRSA PCR SCREENING     Status: None   Collection Time    09/28/13  1:11 PM      Result Value Ref Range Status   MRSA by PCR NEGATIVE  NEGATIVE Final   Comment:            The GeneXpert MRSA Assay (FDA     approved for NASAL specimens     only), is one component of a     comprehensive MRSA colonization     surveillance program. It is not     intended to diagnose MRSA     infection nor to guide or     monitor treatment for     MRSA infections.     Scheduled Meds: . aspirin  81 mg Oral Daily  . cyanocobalamin  500 mcg Oral Daily  . enoxaparin  (LOVENOX) injection  40 mg Subcutaneous Q24H  . hydrochlorothiazide  12.5 mg Oral Daily  . insulin aspart  0-15 Units Subcutaneous TID WC  . insulin aspart  0-5 Units Subcutaneous QHS  . insulin aspart  4 Units Subcutaneous TID WC  . insulin glargine  20 Units Subcutaneous QHS  . levothyroxine  100 mcg Oral QODAY  . levothyroxine  112 mcg Oral QODAY  . metoprolol succinate  25 mg Oral Daily  . multivitamin with minerals  1 tablet Oral Daily   Continuous Infusions: . sodium chloride 75 mL/hr at 09/29/13 0800     Roselani Grajeda, DO  [facility] Pager [phone]  If 7PM-7AM, please contact night-coverage [email or web address] Password [email or web address] 09/29/2013, 12:17 PM   LOS: 1 day

## 2013-09-29 NOTE — Progress Notes (Signed)
Utilization review completed.  

## 2013-09-29 NOTE — Progress Notes (Signed)
Nutrition Education Note  RD consulted for nutrition education regarding diabetes.   Lab Results  Component Value Date   HGBA1C 9.6* 09/28/2013    RD provided "Carbohydrate Counting for People with Diabetes" handout from the Academy of Nutrition and Dietetics. Discussed different food groups and their effects on blood sugar, emphasizing carbohydrate-containing foods. Provided list of carbohydrates and recommended serving sizes of common foods.  Discussed importance of controlled and consistent carbohydrate intake throughout the day. Provided examples of ways to balance meals/snacks and encouraged intake of high-fiber, whole grain complex carbohydrates. Teach back method used.  Usual intake of foods and beverages reviewed. Pt drinks a lot of soda and juice at home and likes to snack on carbohydrates throughout the day. Diabetic friendly beverages reviewed and portion sizes of CHO containing foods discussed. Pt reports her mother is a diabetic and can be a support to her and her sister-in-law is a Microbiologist. Family very motivated to help pt improve her health and control her blood sugars at home.   Expect good compliance.  Body mass index is 31.93 kg/(m^2). Pt meets criteria for class I obesity based on current BMI.  Current diet order is CHO modified diet, no meal intake yet documented. Labs and medications reviewed. No further nutrition interventions warranted at this time. RD contact information provided. If additional nutrition issues arise, please re-consult RD.  Mikey College MS, [place], [place] Pager [phone] After Hours Pager

## 2013-09-30 ENCOUNTER — Telehealth: Payer: Self-pay | Admitting: Family Medicine

## 2013-09-30 LAB — BASIC METABOLIC PANEL
BUN: 11 mg/dL (ref 6–23)
CALCIUM: 8.6 mg/dL (ref 8.4–10.5)
CO2: 24 mEq/L (ref 19–32)
Chloride: 102 mEq/L (ref 96–112)
Creatinine, Ser: 0.67 mg/dL (ref 0.50–1.10)
GFR calc non Af Amer: >90 mL/min (ref >90)
Glucose, Bld: 273 mg/dL — ABNORMAL HIGH (ref 70–99)
Potassium: 4 mEq/L (ref 3.7–5.3)
Sodium: 139 mEq/L (ref 137–147)

## 2013-09-30 LAB — LIPID PANEL
Cholesterol: 135 mg/dL (ref 0–200)
HDL: 29 mg/dL — ABNORMAL LOW (ref >39)
LDL CALC: 65 mg/dL (ref 0–99)
TRIGLYCERIDES: 207 mg/dL — AB (ref <150)
Total CHOL/HDL Ratio: 4.7 RATIO
VLDL: 41 mg/dL — ABNORMAL HIGH (ref 0–40)

## 2013-09-30 LAB — GLUCOSE, CAPILLARY
GLUCOSE-CAPILLARY: 271 mg/dL — AB (ref 70–99)
GLUCOSE-CAPILLARY: 423 mg/dL — AB (ref 70–99)
Glucose-Capillary: 363 mg/dL — ABNORMAL HIGH (ref 70–99)

## 2013-09-30 MED ORDER — INSULIN GLARGINE 100 UNIT/ML ~~LOC~~ SOLN: 20.0000 [IU] | Freq: Every day | SUBCUTANEOUS | Status: DC

## 2013-09-30 MED ORDER — INSULIN ASPART 100 UNIT/ML ~~LOC~~ SOLN
5.0000 [IU] | Freq: Once | SUBCUTANEOUS | Status: AC
  Administered 2013-09-30: 5 [IU] via SUBCUTANEOUS

## 2013-09-30 MED ORDER — INSULIN ASPART 100 UNIT/ML ~~LOC~~ SOLN: 4.0000 [IU] | Freq: Three times a day (TID) | SUBCUTANEOUS | Status: DC

## 2013-09-30 MED ORDER — BD GETTING STARTED TAKE HOME KIT: 3/10ML X 30G SYRINGES
1.0000 | Freq: Once | Status: DC
  Filled 2013-09-30 (×2): qty 1

## 2013-09-30 NOTE — Progress Notes (Signed)
Morning insulin not given due to miscommunication between nursing student and nurse. MD notified and stated to administer 20 units of novolog and recheck glucose at 1230. Blood glucose 363 at 1230 MD aware.

## 2013-09-30 NOTE — Discharge Summary (Signed)
Physician Discharge Summary  Victoria [name] [id number]:[id number] DOB: 04-10-1956 DOA: 09/28/2013  PCP: Eliezer Lofts, MD  Admit date: 09/28/2013 Discharge date: 09/30/2013  Recommendations for Outpatient Follow-up:  1. Pt will need to follow up with PCP in 2 weeks post discharge 2. Please obtain BMP to evaluate electrolytes and kidney function 3. Please also check CBC to evaluate Hg and Hct levels   Discharge Diagnoses:  Active Problems:   HYPOTHYROIDISM   VITAMIN B12 DEFICIENCY   HYPERTENSION   Diabetic hyperosmolar non-ketotic state Hyperosmolar nonketotic state  -Patient had initial anion gap 22 with bicarbonate of 24 on BMP  -Patient was started on IV fluids and IV -Transition to subcutaneous insulin once anion gap closed -Started Lantus 20 units at bedtime  -NovoLog 4 units with meals  -Hemoglobin A1c--9.6  Diabetes mellitus type 2  -Hemoglobin A1c--9.6  -Consulted dietitian  -Consulted diabetic educator to provide education -Outpatient diabetic education classes were set up  -pt instructed to check CBG before each meal and at bedtime and keep glycemic log.  She is to take her log to her PCP which will continue to adjust her insulin regimen Pseudohyponatremia  -Improved -due to hyperglycemia  -continue IVF-saline lock once oral intake improved  Hypertension  -Continue HCTZ and metoprolol succinate  -Remained controlled  Hypothyroidism  -Continue home dose of Synthroid  -Check TSH--0.868  Family Communication: daughter at beside  Disposition Plan: Home when medically stable   Discharge Condition: Stable  Disposition:  discharge home  Diet: Carbohydrate modified Wt Readings from Last 3 Encounters:  09/28/13 79.2 kg (174 lb 9.7 oz)  04/03/13 83.348 kg (183 lb 12 oz)  02/13/13 81.874 kg (180 lb 8 oz)    History of present illness:  58 year old female with a history of impaired glucose tolerance, hypertension, and hypothyroidism presents with three-day history of  generalized weakness, polydipsia, and polyuria. The patient has been in her usual state of health without any major complaints. She denies any fevers, chills, chest discomfort, shortness of breath, nausea, vomiting, diarrhea, abdominal pain, dysuria, hematuria, headaches, dizziness. The patient states that she was never told that she had diabetes. However review of the medical record revealed hemoglobin A1c of 6.1 on 03/27/2013. The patient has never been on any agents for diabetes mellitus.  In the ED, the patient was noted to have serum glucose of 507. She had a BMP showing sodium 133, bicarbonate 24. Hepatic panel was unremarkable. CBC was unremarkable. Lactic acid was 2.41. EKG showed sinus rhythm without any ST or T wave changes. The patient was started on IV insulin and given 2 L normal saline. Chest x-ray was unremarkable.     Discharge Exam: Filed Vitals:   09/30/13 0601  BP: 113/68  Pulse: 69  Temp: 98.5 F (36.9 C)  Resp: 16   Filed Vitals:   09/29/13 1827 09/29/13 2155 09/30/13 0247 09/30/13 0601  BP: 139/58 123/52 116/60 113/68  Pulse: 79 75 75 69  Temp: 99 F (37.2 C) 98.4 F (36.9 C) 98 F (36.7 C) 98.5 F (36.9 C)  TempSrc: Oral Oral Oral Oral  Resp: 16 16 16 16   Height:      Weight:      SpO2: 97% 100% 98% 100%   General: A&O x 3, NAD, pleasant, cooperative Cardiovascular: RRR, no rub, no gallop, no S3 Respiratory: CTAB, no wheeze, no rhonchi Abdomen:soft, nontender, nondistended, positive bowel sounds Extremities: trace LE edema, No lymphangitis, no petechiae  Discharge Instructions      Discharge Orders  Future Orders Complete By Expires   Ambulatory referral to Nutrition and Diabetic Education  As directed    Comments:     New-onset DM, A1C=9.6;  Home phone [phone], Anderson Malta (daughter) Cell: [phone]   Diet - low sodium heart healthy  As directed    Increase activity slowly  As directed        Medication List         aspirin 81 MG  chewable tablet  Chew 81 mg by mouth daily.     CALCIUM 600+D3 600-800 MG-UNIT Tabs  Generic drug:  Calcium Carb-Cholecalciferol  Take 1 tablet by mouth daily.     hydrochlorothiazide 12.5 MG capsule  Commonly known as:  MICROZIDE  Take 12.5 mg by mouth daily.     insulin aspart 100 UNIT/ML injection  Commonly known as:  novoLOG  Inject 4 Units into the skin 3 (three) times daily with meals.     insulin glargine 100 UNIT/ML injection  Commonly known as:  LANTUS  Inject 0.2 mLs (20 Units total) into the skin at bedtime.     levothyroxine 100 MCG tablet  Commonly known as:  SYNTHROID, LEVOTHROID  Take 100 mcg by mouth every other day.     levothyroxine 112 MCG tablet  Commonly known as:  SYNTHROID, LEVOTHROID  Take 112 mcg by mouth every other day.     meloxicam 15 MG tablet  Commonly known as:  MOBIC  Take 15 mg by mouth daily.     metoprolol succinate 25 MG 24 hr tablet  Commonly known as:  TOPROL-XL  Take 25 mg by mouth daily.     multivitamin tablet  Take 1 tablet by mouth daily.     cyanocobalamin 1000 MCG/ML injection  Commonly known as:  (VITAMIN B-12)  Inject 1,000 mcg into the muscle every 30 (thirty) days.     V-R VITAMIN B-12 500 MCG tablet  Generic drug:  cyanocobalamin  Take 500 mcg by mouth daily.         The results of significant diagnostics from this hospitalization (including imaging, microbiology, ancillary and laboratory) are listed below for reference.    Significant Diagnostic Studies: Dg Chest 2 View  09/28/2013   CLINICAL DATA:  Weakness  EXAM: CHEST  2 VIEW  COMPARISON:  02/10/2011  FINDINGS: Cardiac shadow is within normal limits. The lungs are clear bilaterally. Biapical scarring is seen. No acute bony abnormality is noted.  IMPRESSION: No acute abnormality seen.   Electronically Signed   By: Inez Catalina M.D.   On: 09/28/2013 09:32     Microbiology: Recent Results (from the past 240 hour(s))  URINE CULTURE     Status: None    Collection Time    09/28/13  9:15 AM      Result Value Ref Range Status   Specimen Description URINE, RANDOM   Final   Special Requests NONE   Final   Culture  Setup Time     Final   Value: 09/28/2013 19:21     Performed at [place]     Final   Value: 50,000 COLONIES/ML     Performed at [facility]   Culture     Final   Value: Multiple bacterial morphotypes present, none predominant. Suggest appropriate recollection if clinically indicated.     Performed at [facility]   Report Status 09/29/2013 FINAL   Final  MRSA PCR SCREENING     Status: None   Collection  Time    09/28/13  1:11 PM      Result Value Ref Range Status   MRSA by PCR NEGATIVE  NEGATIVE Final   Comment:            The GeneXpert MRSA Assay (FDA     approved for NASAL specimens     only), is one component of a     comprehensive MRSA colonization     surveillance program. It is not     intended to diagnose MRSA     infection nor to guide or     monitor treatment for     MRSA infections.     Labs: Basic Metabolic Panel:  Recent Labs Lab 09/28/13 1102 09/28/13 1511 09/28/13 1915 09/29/13 1015 09/30/13 0654  NA 134* 141 137 140 139  K 4.0 3.6* 3.2* 3.9 4.0  CL 94* 104 103 104 102  CO2 25 21 21 21 24   GLUCOSE 573* 216* 132* 362* 273*  BUN 19 14 11 9 11   CREATININE 0.87 0.71 0.68 0.76 0.67  CALCIUM 9.1 8.5 8.1* 8.5 8.6   Liver Function Tests:  Recent Labs Lab 09/28/13 0818  AST 20  ALT 31  ALKPHOS 115  BILITOT 0.4  PROT 8.9*  ALBUMIN 4.3   No results found for this basename: LIPASE, AMYLASE,  in the last 168 hours No results found for this basename: AMMONIA,  in the last 168 hours CBC:  Recent Labs Lab 09/28/13 0818 09/28/13 1102  WBC 6.9 8.1  NEUTROABS 4.0  --   HGB 14.6 12.7  HCT 40.6 35.7*  MCV 76.9* 77.3*  PLT 278 245   Cardiac Enzymes: No results found for this basename: CKTOTAL, CKMB, CKMBINDEX, TROPONINI,  in the last 168  hours BNP: No components found with this basename: POCBNP,  CBG:  Recent Labs Lab 09/29/13 1158 09/29/13 1602 09/29/13 1831 09/29/13 2158 09/30/13 0746  GLUCAP 339* 293* 290* 248* 271*    Time coordinating discharge:  Greater than 30 minutes  Signed:  Caddie Randle, DO [facility] Pager: [phone] 09/30/2013, 9:35 AM

## 2013-09-30 NOTE — Progress Notes (Signed)
Nsg Discharge Note  Admit Date:  09/28/2013 Discharge date: 09/30/2013   Melford Aase to be D/C'd Home per MD order.  AVS completed.  Copy for chart, and copy for patient signed, and dated. Patient/caregiver able to verbalize understanding.  Discharge Medication:   Medication List         aspirin 81 MG chewable tablet  Chew 81 mg by mouth daily.     CALCIUM 600+D3 600-800 MG-UNIT Tabs  Generic drug:  Calcium Carb-Cholecalciferol  Take 1 tablet by mouth daily.     hydrochlorothiazide 12.5 MG capsule  Commonly known as:  MICROZIDE  Take 12.5 mg by mouth daily.     insulin aspart 100 UNIT/ML injection  Commonly known as:  novoLOG  Inject 4 Units into the skin 3 (three) times daily with meals.     insulin glargine 100 UNIT/ML injection  Commonly known as:  LANTUS  Inject 0.2 mLs (20 Units total) into the skin at bedtime.     levothyroxine 100 MCG tablet  Commonly known as:  SYNTHROID, LEVOTHROID  Take 100 mcg by mouth every other day.     levothyroxine 112 MCG tablet  Commonly known as:  SYNTHROID, LEVOTHROID  Take 112 mcg by mouth every other day.     meloxicam 15 MG tablet  Commonly known as:  MOBIC  Take 15 mg by mouth daily.     metoprolol succinate 25 MG 24 hr tablet  Commonly known as:  TOPROL-XL  Take 25 mg by mouth daily.     multivitamin tablet  Take 1 tablet by mouth daily.     cyanocobalamin 1000 MCG/ML injection  Commonly known as:  (VITAMIN B-12)  Inject 1,000 mcg into the muscle every 30 (thirty) days.     V-R VITAMIN B-12 500 MCG tablet  Generic drug:  cyanocobalamin  Take 500 mcg by mouth daily.        Discharge Assessment: Filed Vitals:   09/30/13 1342  BP: 152/118  Pulse: 79  Temp: 98.2 F (36.8 C)  Resp: 20   Skin clean, dry and intact without evidence of skin break down, no evidence of skin tears noted. IV catheter discontinued intact. Site without signs and symptoms of complications - no redness or edema noted at insertion site,  patient denies c/o pain - only slight tenderness at site.  Dressing with slight pressure applied.  D/c Instructions-Education: Discharge instructions given to patient/family with verbalized understanding. D/c education completed with patient/family including follow up instructions, medication list, d/c activities limitations if indicated, with other d/c instructions as indicated by MD - patient able to verbalize understanding, all questions fully answered. Patient instructed to return to ED, call 911, or call MD for any changes in condition.  Patient escorted via [place], and D/C home via private auto.  Timberlyn Pickford, Elissa Hefty, RN 09/30/2013 2:49 PM

## 2013-09-30 NOTE — Telephone Encounter (Signed)
Patient would like to switch from Dr. Diona Browner to Dr.Aron.  Patient's in [facility] hospital her blood sugar went up to 557.  Patient didn't know she was diabetic. Patient is being discharged today and she needs a follow up appointment in 2 weeks.  Patient's daughter,Jennifer, is a patient of Dr.Aron's and wants her mother to switch to Dr.Aron.  Please advise.

## 2013-09-30 NOTE — Progress Notes (Signed)
Inpatient Diabetes Program Recommendations  AACE/ADA: New Consensus Statement on Inpatient Glycemic Control (2013)  Target Ranges:  Prepandial:   less than 140 mg/dL      Peak postprandial:   less than 180 mg/dL (1-2 hours)      Critically ill patients:  140 - 180 mg/dL     Results for Victoria [name], Victoria [name] (MRN [id number]) as of 09/30/2013 09:14  Ref. Range 09/29/2013 08:09 09/29/2013 11:58 09/29/2013 16:02 09/29/2013 18:31 09/29/2013 21:58  Glucose-Capillary Latest Range: 70-99 mg/dL 306 (H) 339 (H) 293 (H) 290 (H) 248 (H)    Results for Victoria [name], Victoria [name] (MRN [id number]) as of 09/30/2013 09:14  Ref. Range 09/30/2013 07:46  Glucose-Capillary Latest Range: 70-99 mg/dL 271 (H)     **Note that patient has new diagnosis of DM this admission.  DM Coordinator spoke with pt yesterday.  RNs to be providing ongoing education at the bedside.  **Will address insulin instruction with patient today   MD- Please consider the following insulin adjustments:  1. Increase Lantus to 25 units QHS (currently ordered as Lantus 20 units QHS) 2. Consider increasing Novolog meal coverage if pt continues to have elevated postprandial CBGs (has current order for Novolog 4 units tid with meals)   Will follow Victoria Quaker RN, MSN, CDE Diabetes Coordinator Inpatient Diabetes Program Team Pager: [phone] (8a-10p)

## 2013-10-01 ENCOUNTER — Other Ambulatory Visit: Payer: Self-pay | Admitting: Family Medicine

## 2013-10-01 LAB — GLUCOSE, CAPILLARY: Glucose-Capillary: 268 mg/dL — ABNORMAL HIGH (ref 70–99)

## 2013-10-02 NOTE — Telephone Encounter (Signed)
Awaiting Dr. Rometta Emery recommendation.

## 2013-10-27 ENCOUNTER — Other Ambulatory Visit: Payer: Self-pay | Admitting: Family Medicine

## 2013-11-13 ENCOUNTER — Encounter: Payer: Medicare HMO | Attending: Family Medicine | Admitting: *Deleted

## 2013-11-13 DIAGNOSIS — E119 Type 2 diabetes mellitus without complications: Secondary | ICD-10-CM | POA: Insufficient documentation

## 2013-11-13 DIAGNOSIS — Z794 Long term (current) use of insulin: Secondary | ICD-10-CM | POA: Insufficient documentation

## 2013-11-13 DIAGNOSIS — Z713 Dietary counseling and surveillance: Secondary | ICD-10-CM | POA: Insufficient documentation

## 2013-11-13 DIAGNOSIS — E1100 Type 2 diabetes mellitus with hyperosmolarity without nonketotic hyperglycemic-hyperosmolar coma (NKHHC): Secondary | ICD-10-CM

## 2013-11-13 NOTE — Progress Notes (Signed)
Appt start time: 0900 end time:  1030.  Assessment:  Patient was seen on  11/13/13 for individual diabetes education. Here with her daughter, Anderson Malta. She was recently diagnosed with DM 2 and hospitalized with BG over 500 mg/dl. She is currently SMBG 4 times a day before meals and at bedtime with Log Book data showing current ranges between 64 - 1148 mg/dl pre and post meals. She lives with her daughter, Anderson Malta, they share the food shopping and cooking. Anderson Malta is a CNA so is knowledgeable of diabetes already. 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  Current HbA1c: 9.6%  Preferred Learning Style:   No preference indicated   Learning Readiness:   Ready  Change in progress  MEDICATIONS: see list, diabetes medications are Lantus and Novolog  DIETARY INTAKE:  24-hr recall:  B ( AM): unsweet cereal with Splenda, whole milk and apple OR egg, bacon, grits, applesauce, whole milk and coffee  Snk ( AM): no  L ( PM): left overs OR sandwich, jello, Crystal Light iced tea Snk ( PM): no D ( PM): lean meat, whole grain starch, vegetable or salad, Crystal Light tea Snk ( PM): pretzels Beverages: coffee, whole milk, Crystal Light tea  Usual physical activity: light house work and yard work   Estimated energy needs: 1200 calories 135 g carbohydrates 90 g protein 33 g fat  Progress Towards Goal(s):  In progress.   Nutritional Diagnosis:  NB-1.1 Food and nutrition-related knowledge deficit As related to diabetes control.  As evidenced by A1c of 9.6% in April, 2015.    Intervention:  Nutrition counseling provided.  Discussed diabetes disease process and treatment options.  Discussed physiology of diabetes and role of obesity on insulin resistance.  Encouraged moderate weight reduction to improve glucose levels.  Discussed role of medications and diet in glucose control  Provided education on macronutrients on glucose levels.  Provided education on carb counting, importance of  regularly scheduled meals/snacks, and meal planning  Discussed effects of physical activity on glucose levels and long-term glucose control.  Recommended she continue her normal physical activity/week.  Discussed patient medications including insulin.  Discussed role of medication on blood glucose and possible side effects.  Discussed blood glucose monitoring and interpretation.  Discussed recommended target ranges and individual ranges.    Described short-term complications: hyper- and hypo-glycemia.  Discussed causes,symptoms, and treatment options. Encouraged patient to inform her MD if BGs are in the 80's for 2-3 days in a row so the insulin doses can be adjusted as needed.  Discussed prevention, detection, and treatment of long-term complications.  Discussed the role of prolonged elevated glucose levels on body systems.  Discussed role of stress on blood glucose levels and discussed strategies to manage psychosocial issues.  Discussed recommendations for long-term diabetes self-care.  Provided checklist for medical, dental, and emotional self-care.  Plan:  Aim for 2 Carb Choices per meal (30 grams) +/- 1 either way  Aim for 0-1 Carbs per snack if hungry  Include protein in moderation with your meals and snacks Consider reading food labels for Total Carbohydrate of foods Continue with your activity level as tolerated Continue checking BG at alternate times per day as directed by MD  Consider calling your MD if BGs are below 80 a couple of days or more.  Teaching Method Utilized: Visual, Auditory and Hands on  Handouts given during visit include: Living Well with Diabetes Carb Counting and Food Label handouts Meal Plan Card  Barriers to learning/adherence to lifestyle change:  dealing with new diagnosis of chronic disease  Diabetes self-care support plan:   [facility] support group  Family support  Demonstrated degree of understanding via:  Teach Back   Monitoring/Evaluation:   Dietary intake, exercise, reading food labels, SMBG, and body weight in 2 month(s).

## 2013-11-13 NOTE — Patient Instructions (Signed)
Plan:  Aim for 2 Carb Choices per meal (30 grams) +/- 1 either way  Aim for 0-1 Carbs per snack if hungry  Include protein in moderation with your meals and snacks Consider reading food labels for Total Carbohydrate of foods Continue with your activity level as tolerated Continue checking BG at alternate times per day as directed by MD  Consider calling your MD if BGs are below 80 a couple of days or more.

## 2013-11-15 ENCOUNTER — Other Ambulatory Visit: Payer: Self-pay | Admitting: Family Medicine

## 2013-12-20 ENCOUNTER — Other Ambulatory Visit: Payer: Self-pay | Admitting: Family Medicine

## 2014-05-15 ENCOUNTER — Other Ambulatory Visit: Payer: Self-pay | Admitting: Family Medicine

## 2014-05-16 ENCOUNTER — Other Ambulatory Visit: Payer: Self-pay | Admitting: Family Medicine

## 2014-07-14 DIAGNOSIS — Z1211 Encounter for screening for malignant neoplasm of colon: Secondary | ICD-10-CM | POA: Diagnosis not present

## 2014-07-14 DIAGNOSIS — Z1239 Encounter for other screening for malignant neoplasm of breast: Secondary | ICD-10-CM | POA: Diagnosis not present

## 2014-07-14 DIAGNOSIS — M179 Osteoarthritis of knee, unspecified: Secondary | ICD-10-CM | POA: Diagnosis not present

## 2014-07-14 DIAGNOSIS — Z0000 Encounter for general adult medical examination without abnormal findings: Secondary | ICD-10-CM | POA: Diagnosis not present

## 2014-07-15 ENCOUNTER — Other Ambulatory Visit: Payer: Self-pay | Admitting: Family Medicine

## 2014-07-15 DIAGNOSIS — E2839 Other primary ovarian failure: Secondary | ICD-10-CM

## 2014-07-15 DIAGNOSIS — Z1231 Encounter for screening mammogram for malignant neoplasm of breast: Secondary | ICD-10-CM

## 2014-08-06 ENCOUNTER — Ambulatory Visit
Admission: RE | Admit: 2014-08-06 | Discharge: 2014-08-06 | Disposition: A | Discharge status: Home / Self Care | Payer: Commercial Managed Care - HMO | Source: Ambulatory Visit | Attending: Family Medicine | Admitting: Family Medicine

## 2014-08-06 ENCOUNTER — Encounter (INDEPENDENT_AMBULATORY_CARE_PROVIDER_SITE_OTHER): Payer: Self-pay

## 2014-08-06 DIAGNOSIS — Z780 Asymptomatic menopausal state: Secondary | ICD-10-CM | POA: Diagnosis not present

## 2014-08-06 DIAGNOSIS — Z13820 Encounter for screening for osteoporosis: Secondary | ICD-10-CM | POA: Diagnosis not present

## 2014-08-06 DIAGNOSIS — E2839 Other primary ovarian failure: Secondary | ICD-10-CM

## 2014-08-06 DIAGNOSIS — Z1231 Encounter for screening mammogram for malignant neoplasm of breast: Secondary | ICD-10-CM | POA: Diagnosis not present

## 2014-08-16 ENCOUNTER — Telehealth: Payer: Self-pay | Admitting: Family Medicine

## 2014-08-16 NOTE — Telephone Encounter (Signed)
Last office visit 04/03/2013.  Refill?

## 2014-08-17 NOTE — Telephone Encounter (Signed)
Needs appt.. Refill once made until appt.

## 2014-08-18 NOTE — Telephone Encounter (Signed)
Please call and schedule CPE with fasting labs with Dr. Diona Browner.  Once patient is scheduled please route message back to me and I will send in enough medication to get her to her appointment.

## 2014-08-20 NOTE — Telephone Encounter (Signed)
Called to schedule appointment with dr Diona Browner. Pt stated she wasn't a patient of dr Diona Browner any longer Took dr Diona Browner off as PCP

## 2014-08-28 DIAGNOSIS — K648 Other hemorrhoids: Secondary | ICD-10-CM | POA: Diagnosis not present

## 2014-08-28 DIAGNOSIS — Z1211 Encounter for screening for malignant neoplasm of colon: Secondary | ICD-10-CM | POA: Diagnosis not present

## 2014-08-28 DIAGNOSIS — K644 Residual hemorrhoidal skin tags: Secondary | ICD-10-CM | POA: Diagnosis not present

## 2014-09-02 DIAGNOSIS — E538 Deficiency of other specified B group vitamins: Secondary | ICD-10-CM | POA: Diagnosis not present

## 2014-09-02 DIAGNOSIS — D510 Vitamin B12 deficiency anemia due to intrinsic factor deficiency: Secondary | ICD-10-CM | POA: Diagnosis not present

## 2014-09-02 DIAGNOSIS — E785 Hyperlipidemia, unspecified: Secondary | ICD-10-CM | POA: Diagnosis not present

## 2014-09-02 DIAGNOSIS — E039 Hypothyroidism, unspecified: Secondary | ICD-10-CM | POA: Diagnosis not present

## 2014-09-02 DIAGNOSIS — E119 Type 2 diabetes mellitus without complications: Secondary | ICD-10-CM | POA: Diagnosis not present

## 2014-09-02 DIAGNOSIS — I10 Essential (primary) hypertension: Secondary | ICD-10-CM | POA: Diagnosis not present

## 2014-09-02 DIAGNOSIS — G629 Polyneuropathy, unspecified: Secondary | ICD-10-CM | POA: Diagnosis not present

## 2014-09-02 DIAGNOSIS — Z794 Long term (current) use of insulin: Secondary | ICD-10-CM | POA: Diagnosis not present

## 2014-09-19 ENCOUNTER — Other Ambulatory Visit: Payer: Self-pay | Admitting: Family Medicine

## 2014-09-19 NOTE — Telephone Encounter (Signed)
Last office visit 04/03/2013.  Last TSH

## 2014-09-21 DIAGNOSIS — M170 Bilateral primary osteoarthritis of knee: Secondary | ICD-10-CM | POA: Diagnosis not present

## 2014-09-21 DIAGNOSIS — M25561 Pain in right knee: Secondary | ICD-10-CM | POA: Diagnosis not present

## 2014-09-25 ENCOUNTER — Other Ambulatory Visit: Payer: Self-pay | Admitting: Family Medicine

## 2015-01-07 ENCOUNTER — Other Ambulatory Visit: Payer: Self-pay | Admitting: Family Medicine

## 2015-01-18 ENCOUNTER — Other Ambulatory Visit: Payer: Self-pay | Admitting: Family Medicine

## 2015-02-02 DIAGNOSIS — E785 Hyperlipidemia, unspecified: Secondary | ICD-10-CM | POA: Diagnosis not present

## 2015-02-02 DIAGNOSIS — E538 Deficiency of other specified B group vitamins: Secondary | ICD-10-CM | POA: Diagnosis not present

## 2015-02-02 DIAGNOSIS — I10 Essential (primary) hypertension: Secondary | ICD-10-CM | POA: Diagnosis not present

## 2015-02-02 DIAGNOSIS — E119 Type 2 diabetes mellitus without complications: Secondary | ICD-10-CM | POA: Diagnosis not present

## 2015-02-02 DIAGNOSIS — E876 Hypokalemia: Secondary | ICD-10-CM | POA: Diagnosis not present

## 2015-02-02 DIAGNOSIS — Z794 Long term (current) use of insulin: Secondary | ICD-10-CM | POA: Diagnosis not present

## 2015-02-02 DIAGNOSIS — E039 Hypothyroidism, unspecified: Secondary | ICD-10-CM | POA: Diagnosis not present

## 2015-02-02 DIAGNOSIS — D510 Vitamin B12 deficiency anemia due to intrinsic factor deficiency: Secondary | ICD-10-CM | POA: Diagnosis not present

## 2015-03-30 DIAGNOSIS — Z23 Encounter for immunization: Secondary | ICD-10-CM | POA: Diagnosis not present

## 2015-04-14 DIAGNOSIS — H11151 Pinguecula, right eye: Secondary | ICD-10-CM | POA: Diagnosis not present

## 2015-04-14 DIAGNOSIS — H04201 Unspecified epiphora, right lacrimal gland: Secondary | ICD-10-CM | POA: Diagnosis not present

## 2015-04-14 DIAGNOSIS — H04123 Dry eye syndrome of bilateral lacrimal glands: Secondary | ICD-10-CM | POA: Diagnosis not present

## 2015-04-14 DIAGNOSIS — E119 Type 2 diabetes mellitus without complications: Secondary | ICD-10-CM | POA: Diagnosis not present

## 2015-06-08 ENCOUNTER — Other Ambulatory Visit: Payer: Self-pay | Admitting: Family Medicine

## 2015-06-08 DIAGNOSIS — E538 Deficiency of other specified B group vitamins: Secondary | ICD-10-CM | POA: Diagnosis not present

## 2015-06-08 DIAGNOSIS — E039 Hypothyroidism, unspecified: Secondary | ICD-10-CM | POA: Diagnosis not present

## 2015-06-08 DIAGNOSIS — M179 Osteoarthritis of knee, unspecified: Secondary | ICD-10-CM | POA: Diagnosis not present

## 2015-06-08 DIAGNOSIS — E119 Type 2 diabetes mellitus without complications: Secondary | ICD-10-CM | POA: Diagnosis not present

## 2015-06-08 DIAGNOSIS — Z794 Long term (current) use of insulin: Secondary | ICD-10-CM | POA: Diagnosis not present

## 2015-06-08 DIAGNOSIS — R0989 Other specified symptoms and signs involving the circulatory and respiratory systems: Secondary | ICD-10-CM

## 2015-06-08 DIAGNOSIS — E785 Hyperlipidemia, unspecified: Secondary | ICD-10-CM | POA: Diagnosis not present

## 2015-06-08 DIAGNOSIS — Z79899 Other long term (current) drug therapy: Secondary | ICD-10-CM | POA: Diagnosis not present

## 2015-06-08 DIAGNOSIS — I10 Essential (primary) hypertension: Secondary | ICD-10-CM | POA: Diagnosis not present

## 2015-06-16 ENCOUNTER — Ambulatory Visit
Admission: RE | Admit: 2015-06-16 | Discharge: 2015-06-16 | Disposition: A | Discharge status: Home / Self Care | Payer: Commercial Managed Care - HMO | Source: Ambulatory Visit | Attending: Family Medicine | Admitting: Family Medicine

## 2015-06-16 DIAGNOSIS — I6523 Occlusion and stenosis of bilateral carotid arteries: Secondary | ICD-10-CM | POA: Diagnosis not present

## 2015-06-16 DIAGNOSIS — R0989 Other specified symptoms and signs involving the circulatory and respiratory systems: Secondary | ICD-10-CM

## 2015-06-22 ENCOUNTER — Other Ambulatory Visit: Payer: Self-pay | Admitting: Family Medicine

## 2015-06-22 DIAGNOSIS — E041 Nontoxic single thyroid nodule: Secondary | ICD-10-CM

## 2015-06-25 ENCOUNTER — Ambulatory Visit
Admission: RE | Admit: 2015-06-25 | Discharge: 2015-06-25 | Disposition: A | Discharge status: Home / Self Care | Payer: Commercial Managed Care - HMO | Source: Ambulatory Visit | Attending: Family Medicine | Admitting: Family Medicine

## 2015-06-25 DIAGNOSIS — E041 Nontoxic single thyroid nodule: Secondary | ICD-10-CM

## 2015-07-19 DIAGNOSIS — H04551 Acquired stenosis of right nasolacrimal duct: Secondary | ICD-10-CM | POA: Diagnosis not present

## 2015-09-21 DIAGNOSIS — H0489 Other disorders of lacrimal system: Secondary | ICD-10-CM | POA: Diagnosis not present

## 2015-09-21 DIAGNOSIS — E119 Type 2 diabetes mellitus without complications: Secondary | ICD-10-CM | POA: Diagnosis not present

## 2015-09-21 DIAGNOSIS — H04551 Acquired stenosis of right nasolacrimal duct: Secondary | ICD-10-CM | POA: Diagnosis not present

## 2015-09-28 ENCOUNTER — Other Ambulatory Visit: Payer: Self-pay | Admitting: Internal Medicine

## 2015-09-28 ENCOUNTER — Other Ambulatory Visit (HOSPITAL_COMMUNITY)
Admission: RE | Admit: 2015-09-28 | Discharge: 2015-09-28 | Disposition: A | Discharge status: Home / Self Care | Payer: Commercial Managed Care - HMO | Source: Ambulatory Visit | Attending: Family Medicine | Admitting: Family Medicine

## 2015-09-28 ENCOUNTER — Other Ambulatory Visit: Payer: Self-pay

## 2015-09-28 DIAGNOSIS — Z124 Encounter for screening for malignant neoplasm of cervix: Secondary | ICD-10-CM | POA: Diagnosis not present

## 2015-09-28 DIAGNOSIS — Z0000 Encounter for general adult medical examination without abnormal findings: Secondary | ICD-10-CM | POA: Diagnosis not present

## 2015-09-28 DIAGNOSIS — Z1231 Encounter for screening mammogram for malignant neoplasm of breast: Secondary | ICD-10-CM

## 2015-10-01 LAB — CYTOLOGY - PAP

## 2015-10-06 DIAGNOSIS — E119 Type 2 diabetes mellitus without complications: Secondary | ICD-10-CM | POA: Diagnosis not present

## 2015-10-06 DIAGNOSIS — E538 Deficiency of other specified B group vitamins: Secondary | ICD-10-CM | POA: Diagnosis not present

## 2015-10-06 DIAGNOSIS — E785 Hyperlipidemia, unspecified: Secondary | ICD-10-CM | POA: Diagnosis not present

## 2015-10-06 DIAGNOSIS — I10 Essential (primary) hypertension: Secondary | ICD-10-CM | POA: Diagnosis not present

## 2015-10-06 DIAGNOSIS — E039 Hypothyroidism, unspecified: Secondary | ICD-10-CM | POA: Diagnosis not present

## 2015-10-06 DIAGNOSIS — Z794 Long term (current) use of insulin: Secondary | ICD-10-CM | POA: Diagnosis not present

## 2015-10-06 DIAGNOSIS — Z79899 Other long term (current) drug therapy: Secondary | ICD-10-CM | POA: Diagnosis not present

## 2015-10-19 ENCOUNTER — Ambulatory Visit
Admission: RE | Admit: 2015-10-19 | Discharge: 2015-10-19 | Disposition: A | Discharge status: Home / Self Care | Payer: Commercial Managed Care - HMO | Source: Ambulatory Visit | Attending: Internal Medicine | Admitting: Internal Medicine

## 2015-10-19 DIAGNOSIS — Z1231 Encounter for screening mammogram for malignant neoplasm of breast: Secondary | ICD-10-CM | POA: Diagnosis not present

## 2015-11-05 DIAGNOSIS — L80 Vitiligo: Secondary | ICD-10-CM | POA: Diagnosis not present

## 2015-11-05 DIAGNOSIS — E119 Type 2 diabetes mellitus without complications: Secondary | ICD-10-CM | POA: Diagnosis not present

## 2015-11-05 DIAGNOSIS — D510 Vitamin B12 deficiency anemia due to intrinsic factor deficiency: Secondary | ICD-10-CM | POA: Diagnosis not present

## 2015-11-05 DIAGNOSIS — E039 Hypothyroidism, unspecified: Secondary | ICD-10-CM | POA: Diagnosis not present

## 2015-11-05 DIAGNOSIS — Z794 Long term (current) use of insulin: Secondary | ICD-10-CM | POA: Diagnosis not present

## 2015-11-09 DIAGNOSIS — Z794 Long term (current) use of insulin: Secondary | ICD-10-CM | POA: Diagnosis not present

## 2015-11-09 DIAGNOSIS — E039 Hypothyroidism, unspecified: Secondary | ICD-10-CM | POA: Diagnosis not present

## 2015-11-09 DIAGNOSIS — E119 Type 2 diabetes mellitus without complications: Secondary | ICD-10-CM | POA: Diagnosis not present

## 2015-11-16 DIAGNOSIS — Z794 Long term (current) use of insulin: Secondary | ICD-10-CM | POA: Diagnosis not present

## 2015-11-16 DIAGNOSIS — E039 Hypothyroidism, unspecified: Secondary | ICD-10-CM | POA: Diagnosis not present

## 2015-11-16 DIAGNOSIS — D510 Vitamin B12 deficiency anemia due to intrinsic factor deficiency: Secondary | ICD-10-CM | POA: Diagnosis not present

## 2015-11-16 DIAGNOSIS — L80 Vitiligo: Secondary | ICD-10-CM | POA: Diagnosis not present

## 2015-11-16 DIAGNOSIS — E063 Autoimmune thyroiditis: Secondary | ICD-10-CM | POA: Diagnosis not present

## 2015-11-16 DIAGNOSIS — E139 Other specified diabetes mellitus without complications: Secondary | ICD-10-CM | POA: Diagnosis not present

## 2015-12-29 DIAGNOSIS — E119 Type 2 diabetes mellitus without complications: Secondary | ICD-10-CM | POA: Diagnosis not present

## 2015-12-31 DIAGNOSIS — Z794 Long term (current) use of insulin: Secondary | ICD-10-CM | POA: Diagnosis not present

## 2015-12-31 DIAGNOSIS — E119 Type 2 diabetes mellitus without complications: Secondary | ICD-10-CM | POA: Diagnosis not present

## 2015-12-31 DIAGNOSIS — R8290 Unspecified abnormal findings in urine: Secondary | ICD-10-CM | POA: Diagnosis not present

## 2016-01-03 DIAGNOSIS — E139 Other specified diabetes mellitus without complications: Secondary | ICD-10-CM | POA: Diagnosis not present

## 2016-01-03 DIAGNOSIS — L80 Vitiligo: Secondary | ICD-10-CM | POA: Diagnosis not present

## 2016-01-03 DIAGNOSIS — D510 Vitamin B12 deficiency anemia due to intrinsic factor deficiency: Secondary | ICD-10-CM | POA: Diagnosis not present

## 2016-01-03 DIAGNOSIS — E063 Autoimmune thyroiditis: Secondary | ICD-10-CM | POA: Diagnosis not present

## 2016-01-03 DIAGNOSIS — E039 Hypothyroidism, unspecified: Secondary | ICD-10-CM | POA: Diagnosis not present

## 2016-01-03 DIAGNOSIS — Z794 Long term (current) use of insulin: Secondary | ICD-10-CM | POA: Diagnosis not present

## 2016-01-31 DIAGNOSIS — Z794 Long term (current) use of insulin: Secondary | ICD-10-CM | POA: Diagnosis not present

## 2016-01-31 DIAGNOSIS — D510 Vitamin B12 deficiency anemia due to intrinsic factor deficiency: Secondary | ICD-10-CM | POA: Diagnosis not present

## 2016-01-31 DIAGNOSIS — L80 Vitiligo: Secondary | ICD-10-CM | POA: Diagnosis not present

## 2016-01-31 DIAGNOSIS — E063 Autoimmune thyroiditis: Secondary | ICD-10-CM | POA: Diagnosis not present

## 2016-01-31 DIAGNOSIS — E039 Hypothyroidism, unspecified: Secondary | ICD-10-CM | POA: Diagnosis not present

## 2016-01-31 DIAGNOSIS — E139 Other specified diabetes mellitus without complications: Secondary | ICD-10-CM | POA: Diagnosis not present

## 2016-03-06 DIAGNOSIS — L80 Vitiligo: Secondary | ICD-10-CM | POA: Diagnosis not present

## 2016-03-06 DIAGNOSIS — D510 Vitamin B12 deficiency anemia due to intrinsic factor deficiency: Secondary | ICD-10-CM | POA: Diagnosis not present

## 2016-03-06 DIAGNOSIS — E139 Other specified diabetes mellitus without complications: Secondary | ICD-10-CM | POA: Diagnosis not present

## 2016-03-06 DIAGNOSIS — E039 Hypothyroidism, unspecified: Secondary | ICD-10-CM | POA: Diagnosis not present

## 2016-03-06 DIAGNOSIS — E063 Autoimmune thyroiditis: Secondary | ICD-10-CM | POA: Diagnosis not present

## 2016-03-06 DIAGNOSIS — Z794 Long term (current) use of insulin: Secondary | ICD-10-CM | POA: Diagnosis not present

## 2016-04-04 DIAGNOSIS — M25512 Pain in left shoulder: Secondary | ICD-10-CM | POA: Diagnosis not present

## 2016-04-04 DIAGNOSIS — E139 Other specified diabetes mellitus without complications: Secondary | ICD-10-CM | POA: Diagnosis not present

## 2016-04-04 DIAGNOSIS — I10 Essential (primary) hypertension: Secondary | ICD-10-CM | POA: Diagnosis not present

## 2016-04-04 DIAGNOSIS — Z794 Long term (current) use of insulin: Secondary | ICD-10-CM | POA: Diagnosis not present

## 2016-04-04 DIAGNOSIS — E538 Deficiency of other specified B group vitamins: Secondary | ICD-10-CM | POA: Diagnosis not present

## 2016-04-04 DIAGNOSIS — Z23 Encounter for immunization: Secondary | ICD-10-CM | POA: Diagnosis not present

## 2016-04-11 DIAGNOSIS — E139 Other specified diabetes mellitus without complications: Secondary | ICD-10-CM | POA: Diagnosis not present

## 2016-04-11 DIAGNOSIS — E039 Hypothyroidism, unspecified: Secondary | ICD-10-CM | POA: Diagnosis not present

## 2016-04-11 DIAGNOSIS — E063 Autoimmune thyroiditis: Secondary | ICD-10-CM | POA: Diagnosis not present

## 2016-04-11 DIAGNOSIS — Z794 Long term (current) use of insulin: Secondary | ICD-10-CM | POA: Diagnosis not present

## 2016-05-15 DIAGNOSIS — R5383 Other fatigue: Secondary | ICD-10-CM | POA: Diagnosis not present

## 2016-05-15 DIAGNOSIS — E1165 Type 2 diabetes mellitus with hyperglycemia: Secondary | ICD-10-CM | POA: Diagnosis not present

## 2016-05-15 DIAGNOSIS — I10 Essential (primary) hypertension: Secondary | ICD-10-CM | POA: Diagnosis not present

## 2016-05-15 DIAGNOSIS — R61 Generalized hyperhidrosis: Secondary | ICD-10-CM | POA: Diagnosis not present

## 2016-06-12 DIAGNOSIS — E139 Other specified diabetes mellitus without complications: Secondary | ICD-10-CM | POA: Diagnosis not present

## 2016-06-12 DIAGNOSIS — Z794 Long term (current) use of insulin: Secondary | ICD-10-CM | POA: Diagnosis not present

## 2016-06-12 DIAGNOSIS — E039 Hypothyroidism, unspecified: Secondary | ICD-10-CM | POA: Diagnosis not present

## 2016-06-12 DIAGNOSIS — E063 Autoimmune thyroiditis: Secondary | ICD-10-CM | POA: Diagnosis not present

## 2016-06-30 DIAGNOSIS — R05 Cough: Secondary | ICD-10-CM | POA: Diagnosis not present

## 2016-06-30 DIAGNOSIS — R509 Fever, unspecified: Secondary | ICD-10-CM | POA: Diagnosis not present

## 2016-08-04 DIAGNOSIS — R0609 Other forms of dyspnea: Secondary | ICD-10-CM | POA: Diagnosis not present

## 2016-08-04 DIAGNOSIS — M79644 Pain in right finger(s): Secondary | ICD-10-CM | POA: Diagnosis not present

## 2016-08-04 DIAGNOSIS — Z794 Long term (current) use of insulin: Secondary | ICD-10-CM | POA: Diagnosis not present

## 2016-08-04 DIAGNOSIS — I10 Essential (primary) hypertension: Secondary | ICD-10-CM | POA: Diagnosis not present

## 2016-08-04 DIAGNOSIS — E119 Type 2 diabetes mellitus without complications: Secondary | ICD-10-CM | POA: Diagnosis not present

## 2016-08-11 ENCOUNTER — Encounter: Payer: Self-pay | Admitting: Cardiology

## 2016-09-11 ENCOUNTER — Ambulatory Visit: Payer: Medicare HMO | Admitting: Cardiology

## 2016-09-11 DIAGNOSIS — M65312 Trigger thumb, left thumb: Secondary | ICD-10-CM | POA: Diagnosis not present

## 2016-09-11 DIAGNOSIS — M1811 Unilateral primary osteoarthritis of first carpometacarpal joint, right hand: Secondary | ICD-10-CM | POA: Diagnosis not present

## 2016-09-12 ENCOUNTER — Encounter: Payer: Self-pay | Admitting: Cardiology

## 2016-09-13 DIAGNOSIS — M79644 Pain in right finger(s): Secondary | ICD-10-CM | POA: Diagnosis not present

## 2016-09-21 ENCOUNTER — Ambulatory Visit (INDEPENDENT_AMBULATORY_CARE_PROVIDER_SITE_OTHER): Payer: Medicare HMO | Admitting: Cardiology

## 2016-09-21 ENCOUNTER — Encounter (INDEPENDENT_AMBULATORY_CARE_PROVIDER_SITE_OTHER): Payer: Self-pay

## 2016-09-21 ENCOUNTER — Encounter: Payer: Self-pay | Admitting: Cardiology

## 2016-09-21 VITALS — BP 154/80 | HR 76 | Ht 61.5 in | Wt 185.0 lb

## 2016-09-21 DIAGNOSIS — I10 Essential (primary) hypertension: Secondary | ICD-10-CM | POA: Diagnosis not present

## 2016-09-21 DIAGNOSIS — R0609 Other forms of dyspnea: Secondary | ICD-10-CM

## 2016-09-21 NOTE — Progress Notes (Signed)
Cardiology Office Note    Date:  09/21/2016   ID:  Victoria [name], DOB 03-Dec-1955, MRN [id number]  PCP:  Cammy Copa, MD  Cardiologist:  Fransico Him, MD   Chief Complaint  Patient presents with  . Shortness of Breath    History of Present Illness:  Victoria [name] is a 61 y.o. female  With a histoyr of HTN and hypothyroidism who is referred by her PCP, Dr. Aura Dials, for evaluation of DOE.  She says that this has been going on for about 6 months.  She says that she will get SOB when doing yard work, working in her flower beds and vacuming and chasing after her grandchildren.  She does not get SOB with dressing or making beds.  She will get very diaphoretic with the SOB.  She denies any chest pain or pressure.  She will have to sit down and relax to catch her breath.  She occasionally will wake up SOB and have to get up and walk around.  She sleeps on 2 pillows for comfort.  She had a heart catheterization around 2006 that she thinks was normal.  The cath was done for SOB at that time.  She denies any LE edema, palpitations, dizziness or syncope.     Past Medical History:  Diagnosis Date  . Benign essential HTN   . Carpal tunnel syndrome   . Dyslipidemia   . Hypothyroidism   . Jaundice, unspecified, not of newborn    Scleral icterus since age 38  . Obesity, unspecified   . Other B-complex deficiencies   . Unspecified monoarthritis, forearm     Past Surgical History:  Procedure Laterality Date  . CARDIAC CATHETERIZATION    . CHOLECYSTECTOMY    . TEAR DUCT PROBING    . WISDOM TOOTH EXTRACTION      Current Medications: Current Meds  Medication Sig  . aspirin 81 MG chewable tablet Chew 81 mg by mouth daily.  . Calcium Carb-Cholecalciferol (CALCIUM 600+D3) 600-800 MG-UNIT TABS Take 1 tablet by mouth daily.  . cyanocobalamin (V-R VITAMIN B-12) 500 MCG tablet Take 500 mcg by mouth daily.  . hydrochlorothiazide (MICROZIDE) 12.5 MG capsule TAKE 1 CAPSULE BY MOUTH  DAILY  . insulin aspart (NOVOLOG) 100 UNIT/ML injection Inject 4 Units into the skin 3 (three) times daily with meals.  . insulin glargine (LANTUS) 100 UNIT/ML injection Inject 0.2 mLs (20 Units total) into the skin at bedtime.  Marland Kitchen levothyroxine (SYNTHROID, LEVOTHROID) 100 MCG tablet Take 100 mcg by mouth every other day.  . levothyroxine (SYNTHROID, LEVOTHROID) 112 MCG tablet TAKE 1 TABLET BY MOUTH EVERY OTHER DAY  . lisinopril (PRINIVIL,ZESTRIL) 5 MG tablet Take 5 mg by mouth daily.  . meloxicam (MOBIC) 15 MG tablet Take 15 mg by mouth daily.  . metoprolol succinate (TOPROL-XL) 25 MG 24 hr tablet TAKE 1 TABLET BY MOUTH EVERY DAY  . Multiple Vitamin (MULTIVITAMIN) tablet Take 1 tablet by mouth daily.   . simvastatin (ZOCOR) 20 MG tablet Take 20 mg by mouth daily.  . [DISCONTINUED] cyanocobalamin (,VITAMIN B-12,) 1000 MCG/ML injection Inject 1,000 mcg into the muscle every 30 (thirty) days.    Allergies:   Patient has no known allergies.   Social History   Social History  . Marital status: Married    Spouse name: N/A  . Number of children: 2  . Years of education: N/A   Occupational History  . homemaker Disabled   Social History Main Topics  . Smoking status:  Never Smoker  . Smokeless tobacco: Never Used  . Alcohol use No  . Drug use: No  . Sexual activity: Not Asked   Other Topics Concern  . None   Social History Narrative   DIET-- Junk food and fruits and veggies, sweet tea      Exercise: some occassional walking      Looking into living will.     Family History:  The patient's family history includes Diabetes in her mother; Hypertension in her father and mother.   ROS:   Please see the history of present illness.    ROS All other systems reviewed and are negative.  No flowsheet data found.     PHYSICAL EXAM:   VS:  BP (!) 154/80   Pulse 76   Ht 5' 1.5" (1.562 m)   Wt 185 lb (83.9 kg)   SpO2 93%   BMI 34.39 kg/m    GEN: Well nourished, well developed,  in no acute distress  HEENT: normal  Neck: no JVD, carotid bruits, or masses Cardiac: RRR; no murmurs, rubs, or gallops,no edema.  Intact distal pulses bilaterally.  Respiratory:  clear to auscultation bilaterally, normal work of breathing GI: soft, nontender, nondistended, + BS MS: no deformity or atrophy  Skin: warm and dry, no rash Neuro:  Alert and Oriented x 3, Strength and sensation are intact Psych: euthymic mood, full affect  Wt Readings from Last 3 Encounters:  09/21/16 185 lb (83.9 kg)  09/28/13 174 lb 9.7 oz (79.2 kg)  04/03/13 183 lb 12 oz (83.3 kg)      Studies/Labs Reviewed:   EKG:  EKG is ordered today.  The ekg ordered today demonstrates NSR at 76bpm with no ST changes  Recent Labs: No results found for requested labs within last 8760 hours.   Lipid Panel    Component Value Date/Time   CHOL 135 09/30/2013 0654   TRIG 207 (H) 09/30/2013 0654   HDL 29 (L) 09/30/2013 0654   CHOLHDL 4.7 09/30/2013 0654   VLDL 41 (H) 09/30/2013 0654   LDLCALC 65 09/30/2013 0654    Additional studies/ records that were reviewed today include:  Office notes from PCP    ASSESSMENT:    1. Benign essential HTN   2. DOE (dyspnea on exertion)      PLAN:  In order of problems listed above:  1. DOE ? Etiology.  She is obese with a BMI of 34.  She has never smoked and has no family history of CAD.  She denies any anginal CP.  She had a cath in 2006 that she thinks was normal.  Her CRF include obesity, HTN and post menopausal state.  I will set her up for a 2D echo to assess LVF and stress myoview to rule out ischemia.   2. HTN - BP elevated today on exam.  Her daughter says that she gets very anxious going to the MD.  It usually is 135-140/70-80's at home.  I will get a 24 hour BP cuff to assess what her BP dose throughout the day with activity.  She will continue on BB, ACE I and diuretic.     Medication Adjustments/Labs and Tests Ordered: Current medicines are reviewed at  length with the patient today.  Concerns regarding medicines are outlined above.  Medication changes, Labs and Tests ordered today are listed in the Patient Instructions below.  There are no Patient Instructions on file for this visit.   Signed, Fransico Him, MD  09/21/2016  11:42 AM    [facility] [address], [place], [place]  [address] Phone: [phone]; Fax: [phone]

## 2016-09-21 NOTE — Patient Instructions (Signed)
Medication Instructions:  Your physician recommends that you continue on your current medications as directed. Please refer to the Current Medication list given to you today.   Labwork: None  Testing/Procedures: Your physician has requested that you have an echocardiogram. Echocardiography is a painless test that uses sound waves to create images of your heart. It provides your doctor with information about the size and shape of your heart and how well your heart's chambers and valves are working. This procedure takes approximately one hour. There are no restrictions for this procedure.   Dr. Radford Pax recommends you have a NUCLEAR STRESS TEST.  Dr. Radford Pax recommends you wear a 24 HOUR BLOOD PRESSURE MONITOR.  Follow-Up: Your physician recommends that you schedule a follow-up appointment AS NEEDED with Dr. Radford Pax pending study results.  Any Other Special Instructions Will Be Listed Below (If Applicable).     If you need a refill on your cardiac medications before your next appointment, please call your pharmacy.

## 2016-09-27 DIAGNOSIS — Z794 Long term (current) use of insulin: Secondary | ICD-10-CM | POA: Diagnosis not present

## 2016-09-27 DIAGNOSIS — I10 Essential (primary) hypertension: Secondary | ICD-10-CM | POA: Diagnosis not present

## 2016-09-27 DIAGNOSIS — E119 Type 2 diabetes mellitus without complications: Secondary | ICD-10-CM | POA: Diagnosis not present

## 2016-09-27 DIAGNOSIS — Z23 Encounter for immunization: Secondary | ICD-10-CM | POA: Diagnosis not present

## 2016-09-27 DIAGNOSIS — Z0000 Encounter for general adult medical examination without abnormal findings: Secondary | ICD-10-CM | POA: Diagnosis not present

## 2016-09-27 DIAGNOSIS — E559 Vitamin D deficiency, unspecified: Secondary | ICD-10-CM | POA: Diagnosis not present

## 2016-09-27 DIAGNOSIS — E785 Hyperlipidemia, unspecified: Secondary | ICD-10-CM | POA: Diagnosis not present

## 2016-10-02 DIAGNOSIS — H11151 Pinguecula, right eye: Secondary | ICD-10-CM | POA: Diagnosis not present

## 2016-10-02 DIAGNOSIS — H04123 Dry eye syndrome of bilateral lacrimal glands: Secondary | ICD-10-CM | POA: Diagnosis not present

## 2016-10-02 DIAGNOSIS — E119 Type 2 diabetes mellitus without complications: Secondary | ICD-10-CM | POA: Diagnosis not present

## 2016-10-02 DIAGNOSIS — H25813 Combined forms of age-related cataract, bilateral: Secondary | ICD-10-CM | POA: Diagnosis not present

## 2016-10-03 DIAGNOSIS — D649 Anemia, unspecified: Secondary | ICD-10-CM | POA: Diagnosis not present

## 2016-10-05 ENCOUNTER — Telehealth (HOSPITAL_COMMUNITY): Payer: Self-pay | Admitting: *Deleted

## 2016-10-05 NOTE — Telephone Encounter (Signed)
Patient given detailed instructions per Myocardial Perfusion Study Information Sheet for the test on 10/09/16 Patient notified to arrive 15 minutes early and that it is imperative to arrive on time for appointment to keep from having the test rescheduled.  If you need to cancel or reschedule your appointment, please call the office within 24 hours of your appointment. Failure to do so may result in a cancellation of your appointment, and a $50 no show fee. Patient verbalized understanding. Victoria [name]    

## 2016-10-09 ENCOUNTER — Ambulatory Visit (HOSPITAL_COMMUNITY): Payer: Medicare HMO | Attending: Cardiology

## 2016-10-09 ENCOUNTER — Ambulatory Visit (HOSPITAL_BASED_OUTPATIENT_CLINIC_OR_DEPARTMENT_OTHER): Payer: Medicare HMO

## 2016-10-09 ENCOUNTER — Other Ambulatory Visit: Payer: Self-pay

## 2016-10-09 DIAGNOSIS — R0609 Other forms of dyspnea: Secondary | ICD-10-CM

## 2016-10-09 LAB — MYOCARDIAL PERFUSION IMAGING
CHL CUP MPHR: 158 {beats}/min
CHL CUP NUCLEAR SSS: 1
CSEPEDS: 0 s
CSEPEW: 4.6 METS
Exercise duration (min): 4 min
LHR: 0.28
LVDIAVOL: 100 mL (ref 46–106)
LVSYSVOL: 44 mL
NUC STRESS TID: 0.9
Peak HR: 148 {beats}/min
Percent HR: 93 %
Rest HR: 79 {beats}/min
SDS: 1
SRS: 0

## 2016-10-09 LAB — ECHOCARDIOGRAM COMPLETE
CHL CUP MV DEC (S): 257
E decel time: 257 msec
EERAT: 6.36
FS: 33 % (ref 28–44)
IV/PV OW: 0.8
LA diam end sys: 32 mm
LA diam index: 1.74 cm/m2
LASIZE: 32 mm
LAVOL: 51.8 mL
LAVOLA4C: 45.9 mL
LAVOLIN: 28.2 mL/m2
LDCA: 3.14 cm2
LV PW d: 11.5 mm — AB (ref 0.6–1.1)
LVEEAVG: 6.36
LVEEMED: 6.36
LVELAT: 8.7 cm/s
LVOT diameter: 20 mm
Lateral S' vel: 12 cm/s
MVPKAVEL: 69.4 m/s
MVPKEVEL: 55.3 m/s
TDI e' lateral: 8.7
TDI e' medial: 6.42

## 2016-10-09 MED ORDER — TECHNETIUM TC 99M TETROFOSMIN IV KIT
10.1000 | PACK | Freq: Once | INTRAVENOUS | Status: AC | PRN
  Administered 2016-10-09: 10.1 via INTRAVENOUS
  Filled 2016-10-09: qty 11

## 2016-10-09 MED ORDER — PERFLUTREN LIPID MICROSPHERE
1.0000 mL | INTRAVENOUS | Status: AC | PRN
  Administered 2016-10-09: 2 mL via INTRAVENOUS

## 2016-10-09 MED ORDER — PERFLUTREN PROTEIN A MICROSPH IV SUSP
0.5000 mL | Freq: Once | INTRAVENOUS | Status: DC
Start: 2016-10-09 — End: 2016-10-09

## 2016-10-09 MED ORDER — TECHNETIUM TC 99M TETROFOSMIN IV KIT
32.4000 | PACK | Freq: Once | INTRAVENOUS | Status: AC | PRN
  Administered 2016-10-09: 32.4 via INTRAVENOUS
  Filled 2016-10-09: qty 33

## 2016-10-10 ENCOUNTER — Telehealth: Payer: Self-pay

## 2016-10-10 DIAGNOSIS — R0602 Shortness of breath: Secondary | ICD-10-CM

## 2016-10-10 NOTE — Telephone Encounter (Signed)
Informed patient of results and verbal understanding expressed.  Patient states she still has some SOB when she walks. Per Dr. Radford Pax, patient to have BNP. Patient will come in tomorrow for lab work. She agrees with treatment plan.

## 2016-10-10 NOTE — Telephone Encounter (Signed)
-----   Message from Sueanne Margarita, MD sent at 10/10/2016 10:17 AM EDT ----- Please find out if she is still having SOB

## 2016-10-11 ENCOUNTER — Other Ambulatory Visit: Payer: Medicare HMO

## 2016-10-11 DIAGNOSIS — R0602 Shortness of breath: Secondary | ICD-10-CM | POA: Diagnosis not present

## 2016-10-11 LAB — PRO B NATRIURETIC PEPTIDE: NT-Pro BNP: 254 pg/mL (ref 0–287)

## 2016-10-16 DIAGNOSIS — E039 Hypothyroidism, unspecified: Secondary | ICD-10-CM | POA: Diagnosis not present

## 2016-10-16 DIAGNOSIS — E063 Autoimmune thyroiditis: Secondary | ICD-10-CM | POA: Diagnosis not present

## 2016-10-16 DIAGNOSIS — E139 Other specified diabetes mellitus without complications: Secondary | ICD-10-CM | POA: Diagnosis not present

## 2016-10-16 DIAGNOSIS — Z794 Long term (current) use of insulin: Secondary | ICD-10-CM | POA: Diagnosis not present

## 2016-10-20 ENCOUNTER — Ambulatory Visit (INDEPENDENT_AMBULATORY_CARE_PROVIDER_SITE_OTHER): Payer: Medicare HMO

## 2016-10-20 DIAGNOSIS — I10 Essential (primary) hypertension: Secondary | ICD-10-CM | POA: Diagnosis not present

## 2016-12-01 DIAGNOSIS — D649 Anemia, unspecified: Secondary | ICD-10-CM | POA: Diagnosis not present

## 2016-12-01 DIAGNOSIS — E785 Hyperlipidemia, unspecified: Secondary | ICD-10-CM | POA: Diagnosis not present

## 2016-12-01 DIAGNOSIS — E559 Vitamin D deficiency, unspecified: Secondary | ICD-10-CM | POA: Diagnosis not present

## 2016-12-01 DIAGNOSIS — Z79899 Other long term (current) drug therapy: Secondary | ICD-10-CM | POA: Diagnosis not present

## 2016-12-01 DIAGNOSIS — D509 Iron deficiency anemia, unspecified: Secondary | ICD-10-CM | POA: Diagnosis not present

## 2016-12-08 DIAGNOSIS — B349 Viral infection, unspecified: Secondary | ICD-10-CM | POA: Diagnosis not present

## 2016-12-08 DIAGNOSIS — R05 Cough: Secondary | ICD-10-CM | POA: Diagnosis not present

## 2017-01-15 DIAGNOSIS — Z794 Long term (current) use of insulin: Secondary | ICD-10-CM | POA: Diagnosis not present

## 2017-01-15 DIAGNOSIS — E063 Autoimmune thyroiditis: Secondary | ICD-10-CM | POA: Diagnosis not present

## 2017-01-15 DIAGNOSIS — E139 Other specified diabetes mellitus without complications: Secondary | ICD-10-CM | POA: Diagnosis not present

## 2017-01-15 DIAGNOSIS — E039 Hypothyroidism, unspecified: Secondary | ICD-10-CM | POA: Diagnosis not present

## 2017-02-02 ENCOUNTER — Other Ambulatory Visit: Payer: Self-pay | Admitting: Family Medicine

## 2017-02-02 DIAGNOSIS — Z1231 Encounter for screening mammogram for malignant neoplasm of breast: Secondary | ICD-10-CM

## 2017-02-13 DIAGNOSIS — E611 Iron deficiency: Secondary | ICD-10-CM | POA: Diagnosis not present

## 2017-02-13 DIAGNOSIS — E785 Hyperlipidemia, unspecified: Secondary | ICD-10-CM | POA: Diagnosis not present

## 2017-02-13 DIAGNOSIS — E559 Vitamin D deficiency, unspecified: Secondary | ICD-10-CM | POA: Diagnosis not present

## 2017-02-19 ENCOUNTER — Ambulatory Visit
Admission: RE | Admit: 2017-02-19 | Discharge: 2017-02-19 | Disposition: A | Discharge status: Home / Self Care | Payer: Medicare HMO | Source: Ambulatory Visit | Attending: Family Medicine | Admitting: Family Medicine

## 2017-02-19 DIAGNOSIS — Z1231 Encounter for screening mammogram for malignant neoplasm of breast: Secondary | ICD-10-CM

## 2017-04-16 DIAGNOSIS — E538 Deficiency of other specified B group vitamins: Secondary | ICD-10-CM | POA: Diagnosis not present

## 2017-04-16 DIAGNOSIS — D509 Iron deficiency anemia, unspecified: Secondary | ICD-10-CM | POA: Diagnosis not present

## 2017-04-16 DIAGNOSIS — I10 Essential (primary) hypertension: Secondary | ICD-10-CM | POA: Diagnosis not present

## 2017-04-16 DIAGNOSIS — E785 Hyperlipidemia, unspecified: Secondary | ICD-10-CM | POA: Diagnosis not present

## 2017-04-16 DIAGNOSIS — M179 Osteoarthritis of knee, unspecified: Secondary | ICD-10-CM | POA: Diagnosis not present

## 2017-04-16 DIAGNOSIS — E039 Hypothyroidism, unspecified: Secondary | ICD-10-CM | POA: Diagnosis not present

## 2017-04-16 DIAGNOSIS — Z23 Encounter for immunization: Secondary | ICD-10-CM | POA: Diagnosis not present

## 2017-04-23 DIAGNOSIS — E039 Hypothyroidism, unspecified: Secondary | ICD-10-CM | POA: Diagnosis not present

## 2017-04-23 DIAGNOSIS — Z794 Long term (current) use of insulin: Secondary | ICD-10-CM | POA: Diagnosis not present

## 2017-04-23 DIAGNOSIS — E063 Autoimmune thyroiditis: Secondary | ICD-10-CM | POA: Diagnosis not present

## 2017-04-23 DIAGNOSIS — E139 Other specified diabetes mellitus without complications: Secondary | ICD-10-CM | POA: Diagnosis not present

## 2017-04-23 DIAGNOSIS — E1165 Type 2 diabetes mellitus with hyperglycemia: Secondary | ICD-10-CM | POA: Diagnosis not present

## 2017-05-14 DIAGNOSIS — M170 Bilateral primary osteoarthritis of knee: Secondary | ICD-10-CM | POA: Diagnosis not present

## 2017-05-28 ENCOUNTER — Encounter: Payer: Self-pay | Admitting: Podiatry

## 2017-05-28 ENCOUNTER — Ambulatory Visit: Payer: Medicare HMO | Admitting: Podiatry

## 2017-05-28 VITALS — BP 144/83 | HR 84

## 2017-05-28 DIAGNOSIS — B351 Tinea unguium: Secondary | ICD-10-CM

## 2017-05-28 DIAGNOSIS — E1142 Type 2 diabetes mellitus with diabetic polyneuropathy: Secondary | ICD-10-CM | POA: Diagnosis not present

## 2017-05-28 NOTE — Progress Notes (Signed)
   Subjective:    Patient ID: Victoria [name], female    DOB: Jul 19, 1955, 61 y.o.   MRN: [id number]  HPI  This patient presents today with her daughter present in the treatment room. Patient is complaining of a plantar callus left foot for about one month uncomfortable standing walking. No prior history of self treatment Also, patient is requesting toenail debridement as she is not able to trim the toenails as a become elongated and somewhat more difficult to trim over the last 6-12 months  Patient complains of continuous burning, pain and numbness in her feet on off weightbearing Patient is diabetic and denies claudication or amputation Patient denies smoking history  Review of Systems  Musculoskeletal: Positive for gait problem and myalgias.  Skin: Positive for color change.  All other systems reviewed and are negative.      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

## 2017-05-28 NOTE — Progress Notes (Signed)
   Subjective:    Patient ID: Victoria [name], female    DOB: [birth year], 61 y.o.   MRN: [id number]  HPI    Review of Systems  All other systems reviewed and are negative.      Objective:   Physical Exam        Assessment & Plan:

## 2017-05-28 NOTE — Patient Instructions (Signed)

## 2017-07-23 DIAGNOSIS — Z794 Long term (current) use of insulin: Secondary | ICD-10-CM | POA: Diagnosis not present

## 2017-07-23 DIAGNOSIS — Z6834 Body mass index (BMI) 34.0-34.9, adult: Secondary | ICD-10-CM | POA: Diagnosis not present

## 2017-07-23 DIAGNOSIS — M179 Osteoarthritis of knee, unspecified: Secondary | ICD-10-CM | POA: Diagnosis not present

## 2017-07-23 DIAGNOSIS — E039 Hypothyroidism, unspecified: Secondary | ICD-10-CM | POA: Diagnosis not present

## 2017-07-23 DIAGNOSIS — E785 Hyperlipidemia, unspecified: Secondary | ICD-10-CM | POA: Diagnosis not present

## 2017-07-23 DIAGNOSIS — E6609 Other obesity due to excess calories: Secondary | ICD-10-CM | POA: Diagnosis not present

## 2017-07-23 DIAGNOSIS — E119 Type 2 diabetes mellitus without complications: Secondary | ICD-10-CM | POA: Diagnosis not present

## 2017-07-23 DIAGNOSIS — I10 Essential (primary) hypertension: Secondary | ICD-10-CM | POA: Diagnosis not present

## 2017-08-06 DIAGNOSIS — E139 Other specified diabetes mellitus without complications: Secondary | ICD-10-CM | POA: Diagnosis not present

## 2017-08-06 DIAGNOSIS — E063 Autoimmune thyroiditis: Secondary | ICD-10-CM | POA: Diagnosis not present

## 2017-08-06 DIAGNOSIS — Z794 Long term (current) use of insulin: Secondary | ICD-10-CM | POA: Diagnosis not present

## 2017-08-06 DIAGNOSIS — E039 Hypothyroidism, unspecified: Secondary | ICD-10-CM | POA: Diagnosis not present

## 2017-08-20 DIAGNOSIS — M1711 Unilateral primary osteoarthritis, right knee: Secondary | ICD-10-CM | POA: Diagnosis not present

## 2017-08-20 DIAGNOSIS — M170 Bilateral primary osteoarthritis of knee: Secondary | ICD-10-CM | POA: Diagnosis not present

## 2017-08-27 ENCOUNTER — Ambulatory Visit: Payer: Medicare HMO | Admitting: Podiatry

## 2017-09-04 DIAGNOSIS — M170 Bilateral primary osteoarthritis of knee: Secondary | ICD-10-CM | POA: Diagnosis not present

## 2017-10-08 DIAGNOSIS — E119 Type 2 diabetes mellitus without complications: Secondary | ICD-10-CM | POA: Diagnosis not present

## 2017-10-08 DIAGNOSIS — H11151 Pinguecula, right eye: Secondary | ICD-10-CM | POA: Diagnosis not present

## 2017-10-08 DIAGNOSIS — H35033 Hypertensive retinopathy, bilateral: Secondary | ICD-10-CM | POA: Diagnosis not present

## 2017-10-08 DIAGNOSIS — H04123 Dry eye syndrome of bilateral lacrimal glands: Secondary | ICD-10-CM | POA: Diagnosis not present

## 2017-10-08 DIAGNOSIS — H524 Presbyopia: Secondary | ICD-10-CM | POA: Diagnosis not present

## 2017-10-08 DIAGNOSIS — H52223 Regular astigmatism, bilateral: Secondary | ICD-10-CM | POA: Diagnosis not present

## 2017-11-12 DIAGNOSIS — Z794 Long term (current) use of insulin: Secondary | ICD-10-CM | POA: Diagnosis not present

## 2017-11-12 DIAGNOSIS — E063 Autoimmune thyroiditis: Secondary | ICD-10-CM | POA: Diagnosis not present

## 2017-11-12 DIAGNOSIS — E039 Hypothyroidism, unspecified: Secondary | ICD-10-CM | POA: Diagnosis not present

## 2017-11-12 DIAGNOSIS — E139 Other specified diabetes mellitus without complications: Secondary | ICD-10-CM | POA: Diagnosis not present

## 2017-11-14 ENCOUNTER — Encounter: Payer: Self-pay | Admitting: Physician Assistant

## 2017-11-14 DIAGNOSIS — M170 Bilateral primary osteoarthritis of knee: Secondary | ICD-10-CM | POA: Diagnosis not present

## 2017-11-14 DIAGNOSIS — M1711 Unilateral primary osteoarthritis, right knee: Secondary | ICD-10-CM

## 2017-11-14 HISTORY — DX: Unilateral primary osteoarthritis, right knee: M17.11

## 2017-11-14 NOTE — H&P (Signed)
TOTAL KNEE ADMISSION H&P  Patient is being admitted for right total knee arthroplasty.  Subjective:  Chief Complaint:right knee pain.  HPI: Victoria [name], 62 y.o. female, has a history of pain and functional disability in the right knee due to arthritis and has failed non-surgical conservative treatments for greater than 12 weeks to includeNSAID's and/or analgesics, corticosteriod injections, viscosupplementation injections, flexibility and strengthening excercises, supervised PT with diminished ADL's post treatment, use of assistive devices and activity modification.  Onset of symptoms was gradual, starting 10 years ago with gradually worsening course since that time. The patient noted no past surgery on the right knee(s).  Patient currently rates pain in the right knee(s) at 10 out of 10 with activity. Patient has night pain, worsening of pain with activity and weight bearing, pain that interferes with activities of daily living, crepitus and joint swelling.  Patient has evidence of subchondral sclerosis, periarticular osteophytes and joint space narrowing by imaging studies.   There is no active infection.  Patient Active Problem List   Diagnosis Date Noted  . Primary localized osteoarthritis of right knee 11/14/2017  . DOE (dyspnea on exertion) 09/21/2016  . Diabetic hyperosmolar non-ketotic state ([place]) 09/28/2013  . [place] arthritis 04/02/2012  . Prediabetes 02/10/2011  . ARTHRITIS, HAND 02/08/2010  . OTHER MALAISE AND FATIGUE 01/26/2010  . NEUROPATHY 12/02/2008  . OVARIAN CYST, LEFT 09/16/2007  . Hypothyroidism 11/05/2006  . VITAMIN B12 DEFICIENCY 11/05/2006  . OBESITY 11/05/2006  . CARPAL TUNNEL SYNDROME, BILATERAL 11/05/2006  . Benign essential HTN 11/05/2006  . ARTHRITIS, KNEE 11/05/2006   Past Medical History:  Diagnosis Date  . Benign essential HTN   . Carpal tunnel syndrome   . Dyslipidemia   . Hypothyroidism   . Jaundice, unspecified, not of newborn    Scleral icterus  since age 52  . Obesity, unspecified   . Other B-complex deficiencies   . Primary localized osteoarthritis of right knee 11/14/2017  . Unspecified monoarthritis, forearm     Past Surgical History:  Procedure Laterality Date  . CARDIAC CATHETERIZATION    . CHOLECYSTECTOMY    . TEAR DUCT PROBING    . WISDOM TOOTH EXTRACTION      No current facility-administered medications for this encounter.    Current Outpatient Medications  Medication Sig Dispense Refill Last Dose  . aspirin 81 MG chewable tablet Chew 81 mg by mouth every evening.    Taking  . aspirin-acetaminophen-caffeine (EXCEDRIN MIGRAINE) 250-250-65 MG tablet Take 1 tablet by mouth 2 (two) times daily as needed for headache.     . Calcium Carb-Cholecalciferol (CALCIUM 600+D3) 600-800 MG-UNIT TABS Take 1 tablet by mouth every evening.    Taking  . ferrous sulfate 325 (65 FE) MG tablet Take 325 mg by mouth every evening.     . hydrochlorothiazide (MICROZIDE) 12.5 MG capsule TAKE 1 CAPSULE BY MOUTH DAILY (Patient taking differently: Take 12.5 mg by mouth once daily in the evening) 30 capsule 0 Taking  . insulin NPH Human (HUMULIN N,NOVOLIN N) 100 UNIT/ML injection Inject 16 Units into the skin 3 (three) times daily before meals. Draw up Novolin R (9-18 units) then draw up the Novolin N (16 units) in the same syringe     . insulin regular (NOVOLIN R,HUMULIN R) 100 units/mL injection Inject 9-18 Units into the skin 3 (three) times daily between meals. Draw up Novolin R (9-18 units) then draw up the Novolin N (16 units) in the same syringe     . levothyroxine (SYNTHROID, LEVOTHROID) 100  MCG tablet Take 100 mcg by mouth every other day.   Taking  . levothyroxine (SYNTHROID, LEVOTHROID) 112 MCG tablet TAKE 1 TABLET BY MOUTH EVERY OTHER DAY 30 tablet 5 Taking  . lisinopril (PRINIVIL,ZESTRIL) 10 MG tablet Take 10 mg by mouth every evening.     . meloxicam (MOBIC) 15 MG tablet Take 15 mg by mouth every evening.    Taking  . metoprolol succinate  (TOPROL-XL) 25 MG 24 hr tablet TAKE 1 TABLET BY MOUTH EVERY DAY (Patient taking differently: Take 25 mg by mouth once daily in the evening) 90 tablet 0 Taking  . simvastatin (ZOCOR) 20 MG tablet Take 20 mg by mouth every evening.    Taking  . vitamin B-12 (CYANOCOBALAMIN) 500 MCG tablet Take 500 mcg by mouth every evening.     . insulin aspart (NOVOLOG) 100 UNIT/ML injection Inject 4 Units into the skin 3 (three) times daily with meals. (Patient not taking: Reported on 11/12/2017) 10 mL 0 Not Taking at Unknown time  . insulin glargine (LANTUS) 100 UNIT/ML injection Inject 0.2 mLs (20 Units total) into the skin at bedtime. (Patient not taking: Reported on 11/12/2017) 10 mL 0 Not Taking at Unknown time   No Known Allergies  Social History   Tobacco Use  . Smoking status: Never Smoker  . Smokeless tobacco: Never Used  Substance Use Topics  . Alcohol use: No    Family History  Problem Relation Age of Onset  . Diabetes Mother   . Hypertension Mother   . Hypertension Father   . Breast cancer Neg Hx      Review of Systems  Constitutional: Negative.   HENT: Negative.   Eyes: Negative.   Respiratory: Negative.   Cardiovascular: Negative.   Gastrointestinal: Negative.   Genitourinary: Negative.   Musculoskeletal: Positive for back pain and joint pain.  Skin: Negative.   Neurological: Negative.   Endo/Heme/Allergies: Negative.   Psychiatric/Behavioral: Negative.     Objective:  Physical Exam  Constitutional: She is oriented to person, place, and time. She appears well-developed and well-nourished.  HENT:  Head: Normocephalic and atraumatic.  Mouth/Throat: Oropharynx is clear and moist.  Eyes: Pupils are equal, round, and reactive to light. Conjunctivae are normal.  Neck: Neck supple.  Cardiovascular: Normal rate and regular rhythm.  Respiratory: Effort normal and breath sounds normal.  GI: Soft. Bowel sounds are normal.  Genitourinary:  Genitourinary Comments: Not pertinent to  current symptomatology therefore not examined.  Musculoskeletal:  Examination of bilateral knees reveal pain bilaterally, right worse than left.  Moderate varus deformity.  1+ crepitation.  1+ synovitis.  Range of motion -5 to 110 degrees.  Knees are stable with normal patella tracking.  Vascular exam: Pulses are 2+ and symmetric.    Neurological: She is alert and oriented to person, place, and time.  Skin: Skin is warm and dry.  Psychiatric: She has a normal mood and affect. Her behavior is normal.    Vital signs in last 24 hours: Temp:  [97.7 F (36.5 C)] 97.7 F (36.5 C) (05/22 1400) Pulse Rate:  [76] 76 (05/22 1400) BP: (183)/(88) 183/88 (05/22 1400) SpO2:  [98 %] 98 % (05/22 1400) Weight:  [86.7 kg (191 lb 3.2 oz)] 86.7 kg (191 lb 3.2 oz) (05/22 1400)  Labs:   Estimated body mass index is 34.97 kg/m as calculated from the following:   Height as of this encounter: 5\' 2"  (1.575 m).   Weight as of this encounter: 86.7 kg (191 lb 3.2 oz).  Imaging Review Plain radiographs demonstrate severe degenerative joint disease of the right knee(s). The overall alignment issignificant varus. The bone quality appears to be good for age and reported activity level.   Preoperative templating of the joint replacement has been completed, documented, and submitted to the Operating Room personnel in order to optimize intra-operative equipment management.    Patient's anticipated LOS is less than 2 midnights, meeting these requirements: - Younger than 40 - Lives within 1 hour of care - Has a competent adult at home to recover with post-op recover - NO history of  - Chronic pain requiring opiods  - Diabetes  - Coronary Artery Disease  - Heart failure  - Heart attack  - Stroke  - DVT/VTE  - Cardiac arrhythmia  - Respiratory Failure/COPD  - Renal failure  - Anemia  - Advanced Liver disease        Assessment/Plan:  End stage arthritis, right knee  Principal Problem:   Primary  localized osteoarthritis of right knee Active Problems:   Hypothyroidism   OBESITY   Benign essential HTN   [place] arthritis   Diabetic hyperosmolar non-ketotic state ([place])   DOE (dyspnea on exertion)   The patient history, physical examination, clinical judgment of the provider and imaging studies are consistent with end stage degenerative joint disease of the right knee(s) and total knee arthroplasty is deemed medically necessary. The treatment options including medical management, injection therapy arthroscopy and arthroplasty were discussed at length. The risks and benefits of total knee arthroplasty were presented and reviewed. The risks due to aseptic loosening, infection, stiffness, patella tracking problems, thromboembolic complications and other imponderables were discussed. The patient acknowledged the explanation, agreed to proceed with the plan and consent was signed. Patient is being admitted for inpatient treatment for surgery, pain control, PT, OT, prophylactic antibiotics, VTE prophylaxis, progressive ambulation and ADL's and discharge planning. The patient is planning to be discharged home with home health services

## 2017-11-15 NOTE — Pre-Procedure Instructions (Signed)
Victoria [name]  11/15/2017      [facility] - [address], [place] [address] Phone: [phone] Fax: [phone]    Your procedure is scheduled on  Monday 11/26/17  Report to [facility] Admitting at 530 A.M.  Call this number if you have problems the morning of surgery:  [phone]   Remember:  No food  OR DRINK after midnight.    Take these medicines the morning of surgery with A SIP OF WATER -  LEVOTHYROXINE  7 days prior to surgery STOP taking any Aspirin(unless otherwise instructed by your surgeon), Aleve, Naproxen, EXCEDRIN MIGRAINE,  MELOXICAM/ MOBIC,  Ibuprofen, Motrin, Advil, Goody's, BC's, all herbal medications, fish oil, and all vitamins      How to Manage Your Diabetes Before and After Surgery  Why is it important to control my blood sugar before and after surgery? . Improving blood sugar levels before and after surgery helps healing and can limit problems. . A way of improving blood sugar control is eating a healthy diet by: o  Eating less sugar and carbohydrates o  Increasing activity/exercise o  Talking with your doctor about reaching your blood sugar goals . High blood sugars (greater than 180 mg/dL) can raise your risk of infections and slow your recovery, so you will need to focus on controlling your diabetes during the weeks before surgery. . Make sure that the doctor who takes care of your diabetes knows about your planned surgery including the date and location.  How do I manage my blood sugar before surgery? . Check your blood sugar at least 4 times a day, starting 2 days before surgery, to make sure that the level is not too high or low. o Check your blood sugar the morning of your surgery when you wake up and every 2 hours until you get to the Short Stay unit. . If your blood sugar is less than 70 mg/dL, you will need to treat for low  blood sugar: o Do not take insulin. o Treat a low blood sugar (less than 70 mg/dL) with  cup of clear juice (cranberry or apple), 4 glucose tablets, OR glucose gel. Recheck blood sugar in 15 minutes after treatment (to make sure it is greater than 70 mg/dL). If your blood sugar is not greater than 70 mg/dL on recheck, call [phone] o  for further instructions. . Report your blood sugar to the short stay nurse when you get to Short Stay.  . If you are admitted to the hospital after surgery: o Your blood sugar will be checked by the staff and you will probably be given insulin after surgery (instead of oral diabetes medicines) to make sure you have good blood sugar levels. o The goal for blood sugar control after surgery is 80-180 mg/dL.              WHAT DO I DO ABOUT MY DIABETES MEDICATION?   Marland Kitchen Do not take oral diabetes medicines (pills) the morning of surgery.  . THE NIGHT BEFORE SURGERY, take ___________ units of ___________insulin.       . THE MORNING OF SURGERY, take _____________ units of __________insulin.  . The day of surgery, do not take other diabetes injectables, including Byetta (exenatide), Bydureon (exenatide ER), Victoza (liraglutide), or Trulicity (dulaglutide).  . If your CBG is greater than 220 mg/dL, you may take  of your sliding scale (correction) dose of insulin.  Other Instructions:          Patient Signature:  Date:   Nurse Signature:  Date:   Reviewed and Endorsed by [facility] Patient Education Committee, August 2015  Do not wear jewelry, make-up or nail polish.  Do not wear lotions, powders, or perfumes, or deodorant.  Do not shave 48 hours prior to surgery.  Men may shave face and neck.  Do not bring valuables to the hospital.  [facility] is not responsible for any belongings or valuables.  Contacts, dentures or bridgework may not be worn into surgery.  Leave your suitcase in the car.  After surgery it may be brought to your  room.  For patients admitted to the hospital, discharge time will be determined by your treatment team.  Patients discharged the day of surgery will not be allowed to drive home.   Name and phone number of your driver:    Special instructions:  [facility] - Preparing for Surgery  Before surgery, you can play an important role.  Because skin is not sterile, your skin needs to be as free of germs as possible.  You can reduce the number of germs on you skin by washing with CHG (chlorahexidine gluconate) soap before surgery.  CHG is an antiseptic cleaner which kills germs and bonds with the skin to continue killing germs even after washing.  Oral Hygiene is also important in reducing the risk of infection.  Remember to brush your teeth with your regular toothpaste the morning of surgery.  Please DO NOT use if you have an allergy to CHG or antibacterial soaps.  If your skin becomes reddened/irritated stop using the CHG and inform your nurse when you arrive at Short Stay.  Do not shave (including legs and underarms) for at least 48 hours prior to the first CHG shower.  You may shave your face.  Please follow these instructions carefully:   1.  Shower with CHG Soap the night before surgery and the morning of Surgery.  2.  If you choose to wash your hair, wash your hair first as usual with your normal shampoo.  3.  After you shampoo, rinse your hair and body thoroughly to remove the shampoo. 4.  Use CHG as you would any other liquid soap.  You can apply chg directly to the skin and wash gently with a      scrungie or washcloth.           5.  Apply the CHG Soap to your body ONLY FROM THE NECK DOWN.   Do not use on open wounds or open sores. Avoid contact with your eyes, ears, mouth and genitals (private parts).  Wash genitals (private parts) with your normal soap.  6.  Wash thoroughly, paying special attention to the area where your surgery will be performed.  7.  Thoroughly rinse your body with warm  water from the neck down.  8.  DO NOT shower/wash with your normal soap after using and rinsing off the CHG Soap.  9.  Pat yourself dry with a clean towel.            10.  Wear clean pajamas.            11.  Place clean sheets on your bed the night of your first shower and do not sleep with pets.  Day of Surgery  Do not apply any lotions/deoderants the morning of surgery.  Please wear clean clothes to the hospital/surgery center. Remember to brush your teeth with toothpaste.     Please read over the following fact sheets that you were given. MRSA Information and Surgical Site Infection Prevention

## 2017-11-16 ENCOUNTER — Encounter (HOSPITAL_COMMUNITY): Payer: Self-pay

## 2017-11-16 ENCOUNTER — Encounter (HOSPITAL_COMMUNITY)
Admission: RE | Admit: 2017-11-16 | Discharge: 2017-11-16 | Disposition: A | Discharge status: Home / Self Care | Payer: Medicare HMO | Source: Ambulatory Visit | Attending: Orthopedic Surgery | Admitting: Orthopedic Surgery

## 2017-11-16 ENCOUNTER — Other Ambulatory Visit: Payer: Self-pay

## 2017-11-16 DIAGNOSIS — Z01810 Encounter for preprocedural cardiovascular examination: Secondary | ICD-10-CM | POA: Diagnosis not present

## 2017-11-16 DIAGNOSIS — Z01812 Encounter for preprocedural laboratory examination: Secondary | ICD-10-CM | POA: Insufficient documentation

## 2017-11-16 DIAGNOSIS — E119 Type 2 diabetes mellitus without complications: Secondary | ICD-10-CM | POA: Diagnosis not present

## 2017-11-16 HISTORY — DX: Type 2 diabetes mellitus without complications: E11.9

## 2017-11-16 LAB — CBC WITH DIFFERENTIAL/PLATELET
Abs Immature Granulocytes: 0 10*3/uL (ref 0.0–0.1)
BASOS PCT: 1 %
Basophils Absolute: 0 10*3/uL (ref 0.0–0.1)
EOS ABS: 0.2 10*3/uL (ref 0.0–0.7)
EOS PCT: 2 %
HEMATOCRIT: 40 % (ref 36.0–46.0)
Hemoglobin: 13.2 g/dL (ref 12.0–15.0)
IMMATURE GRANULOCYTES: 0 %
LYMPHS ABS: 2.4 10*3/uL (ref 0.7–4.0)
Lymphocytes Relative: 28 %
MCH: 26.9 pg (ref 26.0–34.0)
MCHC: 33 g/dL (ref 30.0–36.0)
MCV: 81.5 fL (ref 78.0–100.0)
MONOS PCT: 13 %
Monocytes Absolute: 1.1 10*3/uL — ABNORMAL HIGH (ref 0.1–1.0)
Neutro Abs: 4.7 10*3/uL (ref 1.7–7.7)
Neutrophils Relative %: 56 %
PLATELETS: 320 10*3/uL (ref 150–400)
RBC: 4.91 MIL/uL (ref 3.87–5.11)
RDW: 13.8 % (ref 11.5–15.5)
WBC: 8.5 10*3/uL (ref 4.0–10.5)

## 2017-11-16 LAB — COMPREHENSIVE METABOLIC PANEL
ALT: 15 U/L (ref 14–54)
ANION GAP: 8 (ref 5–15)
AST: 18 U/L (ref 15–41)
Albumin: 3.9 g/dL (ref 3.5–5.0)
Alkaline Phosphatase: 81 U/L (ref 38–126)
BILIRUBIN TOTAL: 0.6 mg/dL (ref 0.3–1.2)
BUN: 13 mg/dL (ref 6–20)
CHLORIDE: 104 mmol/L (ref 101–111)
CO2: 29 mmol/L (ref 22–32)
Calcium: 9.2 mg/dL (ref 8.9–10.3)
Creatinine, Ser: 0.98 mg/dL (ref 0.44–1.00)
Glucose, Bld: 177 mg/dL — ABNORMAL HIGH (ref 65–99)
POTASSIUM: 3.6 mmol/L (ref 3.5–5.1)
Sodium: 141 mmol/L (ref 135–145)
TOTAL PROTEIN: 8 g/dL (ref 6.5–8.1)

## 2017-11-16 LAB — PROTIME-INR
INR: 1.08
Prothrombin Time: 13.9 seconds (ref 11.4–15.2)

## 2017-11-16 LAB — APTT: aPTT: 37 seconds — ABNORMAL HIGH (ref 24–36)

## 2017-11-16 LAB — GLUCOSE, CAPILLARY: GLUCOSE-CAPILLARY: 173 mg/dL — AB (ref 65–99)

## 2017-11-16 LAB — SURGICAL PCR SCREEN
MRSA, PCR: NEGATIVE
STAPHYLOCOCCUS AUREUS: NEGATIVE

## 2017-11-16 NOTE — Pre-Procedure Instructions (Signed)
Victoria [name]  11/16/2017      [facility] - [address], [place] [address] Phone: [phone] Fax: [phone]    Your procedure is scheduled on  Monday 11/26/17  Report to [facility] Admitting at 530 A.M.  Call this number if you have problems the morning of surgery:  [phone]   Remember:  No food  OR DRINK after midnight.    Take these medicines the morning of surgery with A SIP OF WATER -  LEVOTHYROXINE  7 days prior to surgery STOP taking any Aspirin(unless otherwise instructed by your surgeon), Aleve, Naproxen, EXCEDRIN MIGRAINE,  MELOXICAM/ MOBIC,  Ibuprofen, Motrin, Advil, Goody's, BC's, all herbal medications, fish oil, and all vitamins      How to Manage Your Diabetes Before and After Surgery  Why is it important to control my blood sugar before and after surgery? . Improving blood sugar levels before and after surgery helps healing and can limit problems. . A way of improving blood sugar control is eating a healthy diet by: o  Eating less sugar and carbohydrates o  Increasing activity/exercise o  Talking with your doctor about reaching your blood sugar goals . High blood sugars (greater than 180 mg/dL) can raise your risk of infections and slow your recovery, so you will need to focus on controlling your diabetes during the weeks before surgery. . Make sure that the doctor who takes care of your diabetes knows about your planned surgery including the date and location.  How do I manage my blood sugar before surgery? . Check your blood sugar at least 4 times a day, starting 2 days before surgery, to make sure that the level is not too high or low. o Check your blood sugar the morning of your surgery when you wake up and every 2 hours until you get to the Short Stay unit. . If your blood sugar is less than 70 mg/dL, you will need to treat for low  blood sugar: o Do not take insulin. o Treat a low blood sugar (less than 70 mg/dL) with  cup of clear juice (cranberry or apple), 4 glucose tablets, OR glucose gel. Recheck blood sugar in 15 minutes after treatment (to make sure it is greater than 70 mg/dL). If your blood sugar is not greater than 70 mg/dL on recheck, call [phone] o  for further instructions. . Report your blood sugar to the short stay nurse when you get to Short Stay.  . If you are admitted to the hospital after surgery: o Your blood sugar will be checked by the staff and you will probably be given insulin after surgery (instead of oral diabetes medicines) to make sure you have good blood sugar levels. o The goal for blood sugar control after surgery is 80-180 mg/dL.         WHAT DO I DO ABOUT MY DIABETES MEDICATION?    . THE NIGHT BEFORE SURGERY, take ____11_______ units of ___nph________insulin. Dinner and bedtime      . THE MORNING OF SURGERY, take ________13_____ units of _______nph___insulin.    . If your CBG is greater than 220 mg/dL, you may take  of your sliding scale (correction) dose of insulin.  Other Instructions:  Sliding scale no bedtime dose            Do not wear jewelry, make-up or nail polish.  Do not wear lotions, powders, or perfumes, or deodorant.  Do not shave 48 hours prior to surgery.  Men may shave face and neck.  Do not bring valuables to the hospital.  [facility] is not responsible for any belongings or valuables.  Contacts, dentures or bridgework may not be worn into surgery.  Leave your suitcase in the car.  After surgery it may be brought to your room.  For patients admitted to the hospital, discharge time will be determined by your treatment team.  Patients discharged the day of surgery will not be allowed to drive home.   Name and phone number of your driver:    Special instructions:  [facility] - Preparing for Surgery  Before surgery, you can play an  important role.  Because skin is not sterile, your skin needs to be as free of germs as possible.  You can reduce the number of germs on you skin by washing with CHG (chlorahexidine gluconate) soap before surgery.  CHG is an antiseptic cleaner which kills germs and bonds with the skin to continue killing germs even after washing.  Oral Hygiene is also important in reducing the risk of infection.  Remember to brush your teeth with your regular toothpaste the morning of surgery.  Please DO NOT use if you have an allergy to CHG or antibacterial soaps.  If your skin becomes reddened/irritated stop using the CHG and inform your nurse when you arrive at Short Stay.  Do not shave (including legs and underarms) for at least 48 hours prior to the first CHG shower.  You may shave your face.  Please follow these instructions carefully:   1.  Shower with CHG Soap the night before surgery and the morning of Surgery.  2.  If you choose to wash your hair, wash your hair first as usual with your normal shampoo.  3.  After you shampoo, rinse your hair and body thoroughly to remove the shampoo. 4.  Use CHG as you would any other liquid soap.  You can apply chg directly to the skin and wash gently with a      scrungie or washcloth.           5.  Apply the CHG Soap to your body ONLY FROM THE NECK DOWN.   Do not use on open wounds or open sores. Avoid contact with your eyes, ears, mouth and genitals (private parts).  Wash genitals (private parts) with your normal soap.  6.  Wash thoroughly, paying special attention to the area where your surgery will be performed.  7.  Thoroughly rinse your body with warm water from the neck down.  8.  DO NOT shower/wash with your normal soap after using and rinsing off the CHG Soap.  9.  Pat yourself dry with a clean towel.            10.  Wear clean pajamas.            11.  Place clean sheets on your bed the night of your first shower and do not sleep with pets.  Day of  Surgery  Do not apply any lotions/deoderants the morning of surgery.   Please wear clean clothes to the hospital/surgery center. Remember to brush your teeth with toothpaste.     Please read over the following fact sheets that you were given. MRSA Information and Surgical Site Infection Prevention

## 2017-11-16 NOTE — Progress Notes (Signed)
PCP: Harlan Stains @ [facility] Endocrinologist: Dr. Buddy Duty Cardiologist: Fransico Him  Fasting sugars 98-120  Pt. Was instructed to stop aspirin 3 days prior to surgery

## 2017-11-17 LAB — URINE CULTURE

## 2017-11-20 NOTE — Progress Notes (Signed)
Anesthesia Chart Review:   Case:  [id number] Date/Time:  11/26/17 0700   Procedure:  RIGHT TOTAL KNEE ARTHROPLASTY (Right )   Anesthesia type:  Spinal   Pre-op diagnosis:  DJD RIGHT KNEE   Location:  [facility] ROOM 03 / [facility]   Surgeon:  Elsie Saas, MD      DISCUSSION: - Pt is a 62 year old female with hx HTN, DM   VS: BP (!) 157/72   Pulse 80   Temp 36.8 C   Resp 20   Ht 5\' 2"  (1.575 m)   Wt 189 lb (85.7 kg)   SpO2 98%   BMI 34.57 kg/m    PROVIDERS: PCP is Harlan Stains, MD Endocrinologist is Delrae Rend, MD  Saw cardiologist Fransico Him, MD 09/21/16 for eval DOE/SOB. Echo and stress test ordered, results below. Pt does not routinely see cardiology   LABS: Labs reviewed: Acceptable for surgery. (all labs ordered are listed, but only abnormal results are displayed)  Labs Reviewed  URINE CULTURE - Abnormal; Notable for the following components:      Result Value   Culture MULTIPLE SPECIES PRESENT, SUGGEST RECOLLECTION (*)    All other components within normal limits  GLUCOSE, CAPILLARY - Abnormal; Notable for the following components:   Glucose-Capillary 173 (*)    All other components within normal limits  APTT - Abnormal; Notable for the following components:   aPTT 37 (*)    All other components within normal limits  CBC WITH DIFFERENTIAL/PLATELET - Abnormal; Notable for the following components:   Monocytes Absolute 1.1 (*)    All other components within normal limits  COMPREHENSIVE METABOLIC PANEL - Abnormal; Notable for the following components:   Glucose, Bld 177 (*)    All other components within normal limits  SURGICAL PCR SCREEN  PROTIME-INR     IMAGES:  EKG 11/16/17: NSR   CV:  Ambulatory BP monitor 10/20/16: Normal BP  Echo 10/09/16:  - Left ventricle: The cavity size was normal. Systolic function was normal. The estimated ejection fraction was in the range of 55% to 60%. Wall motion was normal; there were no regional wall motion  abnormalities. There was an increased relative contribution of atrial contraction to ventricular filling. Doppler parameters are consistent with abnormal left ventricular relaxation (grade 1 diastolic dysfunction). - Aortic valve: Trileaflet; mildly thickened, mildly calcified leaflets.  Nuclear stress test 10/09/16:   The patient walked for 4:00 of a Bruce protocol GXT. Peak HR of 148 which is 93% predicted maximal HR.  There were no ST or T wave changes to suggest ischemia  Nuclear stress EF: 57%. The left ventricular ejection fraction is normal (55-65%).  The study is normal. This is a low risk study.  Carotid duplex 06/16/15:  1. Minimal on bilateral carotid bifurcation plaque resulting in less than 50% diameter stenosis. The exam does not exclude plaque ulceration or embolization. Continued surveillance recommended. 2.  1 cm right thyroid nodule.  Cardiac cath 01/01/06:  1.  Normal coronary arteries. 2.  Normal LV function. 3.  Abnormal Cardiolite study due to artifact.   Past Medical History:  Diagnosis Date  . Benign essential HTN   . Carpal tunnel syndrome   . Diabetes mellitus without complication ([place])   . Dyslipidemia   . Hypothyroidism   . Jaundice, unspecified, not of newborn    Scleral icterus since age 67  . Obesity, unspecified   . Other B-complex deficiencies   . Primary localized osteoarthritis of right knee  11/14/2017  . Unspecified monoarthritis, forearm     Past Surgical History:  Procedure Laterality Date  . CARDIAC CATHETERIZATION    . CHOLECYSTECTOMY    . TEAR DUCT PROBING    . WISDOM TOOTH EXTRACTION      MEDICATIONS: . aspirin 81 MG chewable tablet  . aspirin-acetaminophen-caffeine (EXCEDRIN MIGRAINE) 250-250-65 MG tablet  . Calcium Carb-Cholecalciferol (CALCIUM 600+D3) 600-800 MG-UNIT TABS  . ferrous sulfate 325 (65 FE) MG tablet  . hydrochlorothiazide (MICROZIDE) 12.5 MG capsule  . insulin aspart (NOVOLOG) 100 UNIT/ML injection  . insulin  glargine (LANTUS) 100 UNIT/ML injection  . insulin NPH Human (HUMULIN N,NOVOLIN N) 100 UNIT/ML injection  . insulin regular (NOVOLIN R,HUMULIN R) 100 units/mL injection  . levothyroxine (SYNTHROID, LEVOTHROID) 100 MCG tablet  . levothyroxine (SYNTHROID, LEVOTHROID) 112 MCG tablet  . lisinopril (PRINIVIL,ZESTRIL) 10 MG tablet  . meloxicam (MOBIC) 15 MG tablet  . metoprolol succinate (TOPROL-XL) 25 MG 24 hr tablet  . simvastatin (ZOCOR) 20 MG tablet  . vitamin B-12 (CYANOCOBALAMIN) 500 MCG tablet   No current facility-administered medications for this encounter.    - Pt to hold ASA 3 days before surgery   If no changes, I anticipate pt can proceed with surgery as scheduled.   Willeen Cass, FNP-BC [facility]/Anesthesiology Phone: [phone] 11/20/2017 1:27 PM

## 2017-11-23 MED ORDER — BUPIVACAINE LIPOSOME 1.3 % IJ SUSP
20.0000 mL | INTRAMUSCULAR | Status: DC
  Filled 2017-11-23: qty 20

## 2017-11-23 MED ORDER — SODIUM CHLORIDE 0.9 % IV SOLN
1000.0000 mg | INTRAVENOUS | Status: AC
  Administered 2017-11-26: 1000 mg via INTRAVENOUS
  Filled 2017-11-23: qty 1100

## 2017-11-25 ENCOUNTER — Encounter (HOSPITAL_COMMUNITY): Payer: Self-pay | Admitting: Anesthesiology

## 2017-11-25 NOTE — Anesthesia Preprocedure Evaluation (Addendum)
Anesthesia Evaluation  Patient identified by MRN, date of birth, ID band Patient awake    Reviewed: Allergy & Precautions, NPO status , Patient's Chart, lab work & pertinent test results, reviewed documented beta blocker date and time   Airway Mallampati: II  TM Distance: >3 FB Neck ROM: Full    Dental no notable dental hx. (+) Teeth Intact   Pulmonary neg pulmonary ROS,    Pulmonary exam normal breath sounds clear to auscultation       Cardiovascular hypertension, Pt. on medications and Pt. on home beta blockers + DOE  Normal cardiovascular exam Rhythm:Regular Rate:Normal     Neuro/Psych  Neuromuscular disease negative psych ROS   GI/Hepatic negative GI ROS, Neg liver ROS,   Endo/Other  diabetes, Well Controlled, Type 2, Insulin Dependent, Oral Hypoglycemic AgentsHypothyroidism Obesity Hyperlipidemia  Renal/GU negative Renal ROS  negative genitourinary   Musculoskeletal  (+) Arthritis , Osteoarthritis,  OA right knee   Abdominal   Peds  Hematology negative hematology ROS (+)   Anesthesia Other Findings   Reproductive/Obstetrics                            Anesthesia Physical Anesthesia Plan  ASA: II  Anesthesia Plan: Spinal   Post-op Pain Management:  Regional for Post-op pain   Induction:   PONV Risk Score and Plan: 3 and Scopolamine patch - Pre-op, Ondansetron, Propofol infusion, Midazolam and Treatment may vary due to age or medical condition  Airway Management Planned: Natural Airway  Additional Equipment:   Intra-op Plan:   Post-operative Plan: Extubation in OR  Informed Consent: I have reviewed the patients History and Physical, chart, labs and discussed the procedure including the risks, benefits and alternatives for the proposed anesthesia with the patient or authorized representative who has indicated his/her understanding and acceptance.   Dental advisory  given  Plan Discussed with: CRNA and Surgeon  Anesthesia Plan Comments:        Anesthesia Quick Evaluation

## 2017-11-26 ENCOUNTER — Other Ambulatory Visit: Payer: Self-pay

## 2017-11-26 ENCOUNTER — Inpatient Hospital Stay (HOSPITAL_COMMUNITY): Payer: Medicare HMO | Admitting: Anesthesiology

## 2017-11-26 ENCOUNTER — Encounter (HOSPITAL_COMMUNITY)
Admission: RE | Disposition: A | Discharge status: Home Health | Payer: Self-pay | Source: Ambulatory Visit | Attending: Orthopedic Surgery

## 2017-11-26 ENCOUNTER — Inpatient Hospital Stay (HOSPITAL_COMMUNITY)
Admission: RE | Admit: 2017-11-26 | Discharge: 2017-11-28 | DRG: 469 | Disposition: A | Discharge status: Home Health | Payer: Medicare HMO | Source: Ambulatory Visit | Attending: Orthopedic Surgery | Admitting: Orthopedic Surgery

## 2017-11-26 ENCOUNTER — Inpatient Hospital Stay (HOSPITAL_COMMUNITY): Payer: Medicare HMO | Admitting: Emergency Medicine

## 2017-11-26 ENCOUNTER — Encounter (HOSPITAL_COMMUNITY): Payer: Self-pay | Admitting: *Deleted

## 2017-11-26 DIAGNOSIS — E039 Hypothyroidism, unspecified: Secondary | ICD-10-CM | POA: Diagnosis present

## 2017-11-26 DIAGNOSIS — Z794 Long term (current) use of insulin: Secondary | ICD-10-CM | POA: Diagnosis not present

## 2017-11-26 DIAGNOSIS — I10 Essential (primary) hypertension: Secondary | ICD-10-CM | POA: Diagnosis not present

## 2017-11-26 DIAGNOSIS — M19049 Primary osteoarthritis, unspecified hand: Secondary | ICD-10-CM | POA: Diagnosis present

## 2017-11-26 DIAGNOSIS — E785 Hyperlipidemia, unspecified: Secondary | ICD-10-CM | POA: Diagnosis not present

## 2017-11-26 DIAGNOSIS — Z7982 Long term (current) use of aspirin: Secondary | ICD-10-CM

## 2017-11-26 DIAGNOSIS — Z6834 Body mass index (BMI) 34.0-34.9, adult: Secondary | ICD-10-CM

## 2017-11-26 DIAGNOSIS — Z791 Long term (current) use of non-steroidal anti-inflammatories (NSAID): Secondary | ICD-10-CM

## 2017-11-26 DIAGNOSIS — Z79899 Other long term (current) drug therapy: Secondary | ICD-10-CM | POA: Diagnosis not present

## 2017-11-26 DIAGNOSIS — E1140 Type 2 diabetes mellitus with diabetic neuropathy, unspecified: Secondary | ICD-10-CM | POA: Diagnosis not present

## 2017-11-26 DIAGNOSIS — E1100 Type 2 diabetes mellitus with hyperosmolarity without nonketotic hyperglycemic-hyperosmolar coma (NKHHC): Secondary | ICD-10-CM | POA: Diagnosis present

## 2017-11-26 DIAGNOSIS — Z96651 Presence of right artificial knee joint: Secondary | ICD-10-CM | POA: Diagnosis not present

## 2017-11-26 DIAGNOSIS — E669 Obesity, unspecified: Secondary | ICD-10-CM | POA: Diagnosis present

## 2017-11-26 DIAGNOSIS — R0609 Other forms of dyspnea: Secondary | ICD-10-CM

## 2017-11-26 DIAGNOSIS — M1711 Unilateral primary osteoarthritis, right knee: Principal | ICD-10-CM | POA: Diagnosis present

## 2017-11-26 DIAGNOSIS — G8918 Other acute postprocedural pain: Secondary | ICD-10-CM | POA: Diagnosis not present

## 2017-11-26 HISTORY — PX: TOTAL KNEE ARTHROPLASTY: SHX125

## 2017-11-26 HISTORY — DX: Unilateral primary osteoarthritis, right knee: M17.11

## 2017-11-26 LAB — GLUCOSE, CAPILLARY
GLUCOSE-CAPILLARY: 289 mg/dL — AB (ref 65–99)
GLUCOSE-CAPILLARY: 357 mg/dL — AB (ref 65–99)
GLUCOSE-CAPILLARY: 313 mg/dL — AB (ref 65–99)
Glucose-Capillary: 223 mg/dL — ABNORMAL HIGH (ref 65–99)
Glucose-Capillary: 353 mg/dL — ABNORMAL HIGH (ref 65–99)

## 2017-11-26 LAB — HEMOGLOBIN A1C
Hgb A1c MFr Bld: 8.3 % — ABNORMAL HIGH (ref 4.8–5.6)
Mean Plasma Glucose: 191.51 mg/dL

## 2017-11-26 SURGERY — ARTHROPLASTY, KNEE, TOTAL
Anesthesia: Regional | Site: Knee | Laterality: Right

## 2017-11-26 MED ORDER — SODIUM CHLORIDE 0.9 % IV SOLN
INTRAVENOUS | Status: DC | PRN
  Administered 2017-11-26: 20 mL

## 2017-11-26 MED ORDER — CEFAZOLIN SODIUM-DEXTROSE 2-4 GM/100ML-% IV SOLN
INTRAVENOUS | Status: AC
  Filled 2017-11-26: qty 100

## 2017-11-26 MED ORDER — GABAPENTIN 300 MG PO CAPS
300.0000 mg | ORAL_CAPSULE | Freq: Every day | ORAL | Status: DC
  Administered 2017-11-26 – 2017-11-27 (×2): 300 mg via ORAL
  Filled 2017-11-26 (×2): qty 1

## 2017-11-26 MED ORDER — HYDROMORPHONE HCL 2 MG/ML IJ SOLN
INTRAMUSCULAR | Status: AC
  Administered 2017-11-26: 0.5 mg via INTRAVENOUS
  Filled 2017-11-26: qty 1

## 2017-11-26 MED ORDER — BUPIVACAINE-EPINEPHRINE (PF) 0.5% -1:200000 IJ SOLN
INTRAMUSCULAR | Status: AC
  Filled 2017-11-26: qty 30

## 2017-11-26 MED ORDER — ALUM & MAG HYDROXIDE-SIMETH 200-200-20 MG/5ML PO SUSP: 30.0000 mL | ORAL | Status: DC | PRN

## 2017-11-26 MED ORDER — HYDROMORPHONE HCL 2 MG/ML IJ SOLN: 0.5000 mg | INTRAMUSCULAR | Status: DC | PRN

## 2017-11-26 MED ORDER — CALCIUM CARB-CHOLECALCIFEROL 600-800 MG-UNIT PO TABS: 1.0000 | ORAL_TABLET | Freq: Every evening | ORAL | Status: DC

## 2017-11-26 MED ORDER — POTASSIUM CHLORIDE IN NACL 20-0.9 MEQ/L-% IV SOLN
INTRAVENOUS | Status: DC
  Administered 2017-11-26 – 2017-11-27 (×2): via INTRAVENOUS
  Filled 2017-11-26 (×2): qty 1000

## 2017-11-26 MED ORDER — CEFUROXIME SODIUM 750 MG IJ SOLR
INTRAMUSCULAR | Status: AC
  Filled 2017-11-26: qty 750

## 2017-11-26 MED ORDER — 0.9 % SODIUM CHLORIDE (POUR BTL) OPTIME
TOPICAL | Status: DC | PRN
  Administered 2017-11-26: 1000 mL

## 2017-11-26 MED ORDER — INSULIN ASPART 100 UNIT/ML ~~LOC~~ SOLN
0.0000 [IU] | Freq: Three times a day (TID) | SUBCUTANEOUS | Status: DC
  Administered 2017-11-26 (×2): 20 [IU] via SUBCUTANEOUS
  Administered 2017-11-27: 7 [IU] via SUBCUTANEOUS
  Administered 2017-11-27 (×2): 20 [IU] via SUBCUTANEOUS
  Administered 2017-11-28: 2 [IU] via SUBCUTANEOUS

## 2017-11-26 MED ORDER — MIDAZOLAM HCL 5 MG/5ML IJ SOLN
INTRAMUSCULAR | Status: DC | PRN
  Administered 2017-11-26: 1 mg via INTRAVENOUS

## 2017-11-26 MED ORDER — BUPIVACAINE-EPINEPHRINE 0.5% -1:200000 IJ SOLN
INTRAMUSCULAR | Status: DC | PRN
  Administered 2017-11-26: 60 mL

## 2017-11-26 MED ORDER — CEFAZOLIN SODIUM-DEXTROSE 2-4 GM/100ML-% IV SOLN
2.0000 g | Freq: Four times a day (QID) | INTRAVENOUS | Status: AC
  Administered 2017-11-26 (×2): 2 g via INTRAVENOUS
  Filled 2017-11-26 (×2): qty 100

## 2017-11-26 MED ORDER — CHLORHEXIDINE GLUCONATE 4 % EX LIQD: 60.0000 mL | Freq: Once | CUTANEOUS | Status: DC

## 2017-11-26 MED ORDER — CEFUROXIME SODIUM 1.5 G IV SOLR
INTRAVENOUS | Status: DC | PRN
  Administered 2017-11-26: 1.5 g via INTRAVENOUS

## 2017-11-26 MED ORDER — CALCIUM CARBONATE-VITAMIN D 500-200 MG-UNIT PO TABS
1.0000 | ORAL_TABLET | Freq: Every evening | ORAL | Status: DC
  Administered 2017-11-26 – 2017-11-27 (×2): 1 via ORAL
  Filled 2017-11-26 (×2): qty 1

## 2017-11-26 MED ORDER — INSULIN ASPART 100 UNIT/ML ~~LOC~~ SOLN
0.0000 [IU] | Freq: Every day | SUBCUTANEOUS | Status: DC
  Administered 2017-11-26: 3 [IU] via SUBCUTANEOUS

## 2017-11-26 MED ORDER — INSULIN NPH (HUMAN) (ISOPHANE) 100 UNIT/ML ~~LOC~~ SUSP
16.0000 [IU] | Freq: Three times a day (TID) | SUBCUTANEOUS | Status: DC
  Administered 2017-11-26 – 2017-11-27 (×3): 16 [IU] via SUBCUTANEOUS
  Filled 2017-11-26 (×2): qty 10

## 2017-11-26 MED ORDER — MENTHOL 3 MG MT LOZG: 1.0000 | LOZENGE | OROMUCOSAL | Status: DC | PRN

## 2017-11-26 MED ORDER — DEXAMETHASONE SODIUM PHOSPHATE 10 MG/ML IJ SOLN
INTRAMUSCULAR | Status: DC | PRN
  Administered 2017-11-26: 10 mg via INTRAVENOUS

## 2017-11-26 MED ORDER — FERROUS SULFATE 325 (65 FE) MG PO TABS
325.0000 mg | ORAL_TABLET | Freq: Every evening | ORAL | Status: DC
  Administered 2017-11-26 – 2017-11-27 (×2): 325 mg via ORAL
  Filled 2017-11-26 (×2): qty 1

## 2017-11-26 MED ORDER — POVIDONE-IODINE 7.5 % EX SOLN
Freq: Once | CUTANEOUS | Status: DC
  Filled 2017-11-26: qty 118

## 2017-11-26 MED ORDER — FENTANYL CITRATE (PF) 250 MCG/5ML IJ SOLN
INTRAMUSCULAR | Status: DC | PRN
  Administered 2017-11-26 (×7): 25 ug via INTRAVENOUS
  Administered 2017-11-26: 50 ug via INTRAVENOUS
  Administered 2017-11-26: 25 ug via INTRAVENOUS

## 2017-11-26 MED ORDER — LACTATED RINGERS IV SOLN: INTRAVENOUS | Status: DC

## 2017-11-26 MED ORDER — METOPROLOL SUCCINATE ER 25 MG PO TB24
25.0000 mg | ORAL_TABLET | Freq: Every day | ORAL | Status: DC
  Administered 2017-11-26 – 2017-11-28 (×3): 25 mg via ORAL
  Filled 2017-11-26 (×3): qty 1

## 2017-11-26 MED ORDER — DOCUSATE SODIUM 100 MG PO CAPS
100.0000 mg | ORAL_CAPSULE | Freq: Two times a day (BID) | ORAL | Status: DC
  Administered 2017-11-26 – 2017-11-28 (×5): 100 mg via ORAL
  Filled 2017-11-26 (×5): qty 1

## 2017-11-26 MED ORDER — ASPIRIN EC 325 MG PO TBEC
325.0000 mg | DELAYED_RELEASE_TABLET | Freq: Every day | ORAL | Status: DC
  Administered 2017-11-27 – 2017-11-28 (×2): 325 mg via ORAL
  Filled 2017-11-26 (×2): qty 1

## 2017-11-26 MED ORDER — SODIUM CHLORIDE 0.9 % IJ SOLN
INTRAMUSCULAR | Status: DC | PRN
  Administered 2017-11-26: 50 mL via INTRAVENOUS

## 2017-11-26 MED ORDER — LABETALOL HCL 5 MG/ML IV SOLN
INTRAVENOUS | Status: DC | PRN
  Administered 2017-11-26 (×2): 5 mg via INTRAVENOUS

## 2017-11-26 MED ORDER — METOCLOPRAMIDE HCL 5 MG/ML IJ SOLN: 5.0000 mg | Freq: Three times a day (TID) | INTRAMUSCULAR | Status: DC | PRN

## 2017-11-26 MED ORDER — DIPHENHYDRAMINE HCL 12.5 MG/5ML PO ELIX: 12.5000 mg | ORAL_SOLUTION | ORAL | Status: DC | PRN

## 2017-11-26 MED ORDER — PROPOFOL 10 MG/ML IV BOLUS
INTRAVENOUS | Status: DC | PRN
  Administered 2017-11-26: 130 mg via INTRAVENOUS

## 2017-11-26 MED ORDER — METOPROLOL SUCCINATE ER 25 MG PO TB24
ORAL_TABLET | ORAL | Status: AC
  Administered 2017-11-26: 25 mg via ORAL
  Filled 2017-11-26: qty 1

## 2017-11-26 MED ORDER — SIMVASTATIN 20 MG PO TABS
20.0000 mg | ORAL_TABLET | Freq: Every evening | ORAL | Status: DC
  Administered 2017-11-26 – 2017-11-27 (×2): 20 mg via ORAL
  Filled 2017-11-26 (×2): qty 1

## 2017-11-26 MED ORDER — FENTANYL CITRATE (PF) 250 MCG/5ML IJ SOLN
INTRAMUSCULAR | Status: AC
  Filled 2017-11-26: qty 5

## 2017-11-26 MED ORDER — MEPERIDINE HCL 50 MG/ML IJ SOLN: 6.2500 mg | INTRAMUSCULAR | Status: DC | PRN

## 2017-11-26 MED ORDER — SODIUM CHLORIDE 0.9 % IR SOLN
Status: DC | PRN
  Administered 2017-11-26: 1000 mL

## 2017-11-26 MED ORDER — LACTATED RINGERS IV SOLN
INTRAVENOUS | Status: DC | PRN
  Administered 2017-11-26: 07:00:00 via INTRAVENOUS

## 2017-11-26 MED ORDER — PHENOL 1.4 % MT LIQD: 1.0000 | OROMUCOSAL | Status: DC | PRN

## 2017-11-26 MED ORDER — BUPIVACAINE-EPINEPHRINE (PF) 0.5% -1:200000 IJ SOLN
INTRAMUSCULAR | Status: DC | PRN
  Administered 2017-11-26: 30 mL via PERINEURAL

## 2017-11-26 MED ORDER — POLYETHYLENE GLYCOL 3350 17 G PO PACK
17.0000 g | PACK | Freq: Two times a day (BID) | ORAL | Status: DC
  Administered 2017-11-26 – 2017-11-28 (×4): 17 g via ORAL
  Filled 2017-11-26 (×4): qty 1

## 2017-11-26 MED ORDER — LEVOTHYROXINE SODIUM 112 MCG PO TABS
112.0000 ug | ORAL_TABLET | ORAL | Status: DC
  Administered 2017-11-27 – 2017-11-28 (×2): 112 ug via ORAL
  Filled 2017-11-26 (×3): qty 1

## 2017-11-26 MED ORDER — CEFAZOLIN SODIUM-DEXTROSE 2-4 GM/100ML-% IV SOLN
2.0000 g | INTRAVENOUS | Status: AC
  Administered 2017-11-26: 2 g via INTRAVENOUS

## 2017-11-26 MED ORDER — ONDANSETRON HCL 4 MG/2ML IJ SOLN
INTRAMUSCULAR | Status: DC | PRN
  Administered 2017-11-26: 4 mg via INTRAVENOUS

## 2017-11-26 MED ORDER — DEXAMETHASONE SODIUM PHOSPHATE 10 MG/ML IJ SOLN
10.0000 mg | Freq: Three times a day (TID) | INTRAMUSCULAR | Status: DC
  Administered 2017-11-26 – 2017-11-27 (×2): 10 mg via INTRAVENOUS
  Filled 2017-11-26 (×3): qty 1

## 2017-11-26 MED ORDER — METOCLOPRAMIDE HCL 5 MG PO TABS: 5.0000 mg | ORAL_TABLET | Freq: Three times a day (TID) | ORAL | Status: DC | PRN

## 2017-11-26 MED ORDER — OXYCODONE HCL 5 MG PO TABS
5.0000 mg | ORAL_TABLET | ORAL | Status: DC | PRN
  Administered 2017-11-26 – 2017-11-28 (×6): 5 mg via ORAL
  Filled 2017-11-26: qty 2
  Filled 2017-11-26 (×4): qty 1
  Filled 2017-11-26: qty 2
  Filled 2017-11-26: qty 1

## 2017-11-26 MED ORDER — ONDANSETRON HCL 4 MG/2ML IJ SOLN
4.0000 mg | Freq: Four times a day (QID) | INTRAMUSCULAR | Status: DC | PRN
  Administered 2017-11-26: 4 mg via INTRAVENOUS
  Filled 2017-11-26: qty 2

## 2017-11-26 MED ORDER — METOPROLOL SUCCINATE ER 25 MG PO TB24: 25.0000 mg | ORAL_TABLET | Freq: Every day | ORAL | Status: DC

## 2017-11-26 MED ORDER — PROPOFOL 10 MG/ML IV BOLUS
INTRAVENOUS | Status: AC
  Filled 2017-11-26: qty 20

## 2017-11-26 MED ORDER — ACETAMINOPHEN 500 MG PO TABS
1000.0000 mg | ORAL_TABLET | Freq: Four times a day (QID) | ORAL | Status: AC
  Administered 2017-11-26 – 2017-11-27 (×4): 1000 mg via ORAL
  Filled 2017-11-26 (×4): qty 2

## 2017-11-26 MED ORDER — METOCLOPRAMIDE HCL 5 MG/ML IJ SOLN
10.0000 mg | Freq: Once | INTRAMUSCULAR | Status: AC | PRN
  Administered 2017-11-26: 10 mg via INTRAVENOUS

## 2017-11-26 MED ORDER — METOCLOPRAMIDE HCL 5 MG/ML IJ SOLN
INTRAMUSCULAR | Status: AC
  Administered 2017-11-26: 11:00:00
  Filled 2017-11-26: qty 2

## 2017-11-26 MED ORDER — METOPROLOL SUCCINATE ER 25 MG PO TB24
25.0000 mg | ORAL_TABLET | Freq: Once | ORAL | Status: AC
  Administered 2017-11-26: 25 mg via ORAL
  Filled 2017-11-26: qty 1

## 2017-11-26 MED ORDER — HYDROMORPHONE HCL 2 MG/ML IJ SOLN
0.2500 mg | INTRAMUSCULAR | Status: DC | PRN
  Administered 2017-11-26: 0.5 mg via INTRAVENOUS

## 2017-11-26 MED ORDER — MIDAZOLAM HCL 2 MG/2ML IJ SOLN
INTRAMUSCULAR | Status: AC
  Filled 2017-11-26: qty 2

## 2017-11-26 MED ORDER — ONDANSETRON HCL 4 MG PO TABS: 4.0000 mg | ORAL_TABLET | Freq: Four times a day (QID) | ORAL | Status: DC | PRN

## 2017-11-26 SURGICAL SUPPLY — 75 items
APL SKNCLS STERI-STRIP NONHPOA (GAUZE/BANDAGES/DRESSINGS) ×1
BANDAGE ESMARK 6X9 LF (GAUZE/BANDAGES/DRESSINGS) ×1 IMPLANT
BENZOIN TINCTURE PRP APPL 2/3 (GAUZE/BANDAGES/DRESSINGS) ×3 IMPLANT
BLADE SAGITTAL 25.0X1.19X90 (BLADE) ×2 IMPLANT
BLADE SAGITTAL 25.0X1.19X90MM (BLADE) ×1
BLADE SAW SGTL 13X75X1.27 (BLADE) ×3 IMPLANT
BLADE SURG 10 STRL SS (BLADE) ×6 IMPLANT
BNDG CMPR 9X6 STRL LF SNTH (GAUZE/BANDAGES/DRESSINGS) ×1
BNDG CMPR MED 15X6 ELC VLCR LF (GAUZE/BANDAGES/DRESSINGS) ×1
BNDG ELASTIC 6X15 VLCR STRL LF (GAUZE/BANDAGES/DRESSINGS) ×3 IMPLANT
BNDG ESMARK 6X9 LF (GAUZE/BANDAGES/DRESSINGS) ×3
BOWL SMART MIX CTS (DISPOSABLE) ×3 IMPLANT
CAPT KNEE TOTAL 3 ATTUNE ×2 IMPLANT
CEMENT HV SMART SET (Cement) ×6 IMPLANT
CLOSURE STERI-STRIP 1/2X4 (GAUZE/BANDAGES/DRESSINGS) ×1
CLOSURE WOUND 1/2 X4 (GAUZE/BANDAGES/DRESSINGS) ×1
CLSR STERI-STRIP ANTIMIC 1/2X4 (GAUZE/BANDAGES/DRESSINGS) ×1 IMPLANT
CONT SPEC 4OZ CLIKSEAL STRL BL (MISCELLANEOUS) ×3 IMPLANT
COVER SURGICAL LIGHT HANDLE (MISCELLANEOUS) ×3 IMPLANT
CUFF TOURNIQUET SINGLE 34IN LL (TOURNIQUET CUFF) ×3 IMPLANT
CUFF TOURNIQUET SINGLE 44IN (TOURNIQUET CUFF) IMPLANT
DECANTER SPIKE VIAL GLASS SM (MISCELLANEOUS) ×3 IMPLANT
DRAPE EXTREMITY T 121X128X90 (DRAPE) ×3 IMPLANT
DRAPE HALF SHEET 40X57 (DRAPES) ×6 IMPLANT
DRAPE INCISE IOBAN 66X45 STRL (DRAPES) IMPLANT
DRAPE ORTHO SPLIT 77X108 STRL (DRAPES) ×3
DRAPE SURG ORHT 6 SPLT 77X108 (DRAPES) ×1 IMPLANT
DRAPE U-SHAPE 47X51 STRL (DRAPES) ×3 IMPLANT
DRSG AQUACEL AG ADV 3.5X10 (GAUZE/BANDAGES/DRESSINGS) ×3 IMPLANT
DURAPREP 26ML APPLICATOR (WOUND CARE) ×3 IMPLANT
ELECT CAUTERY BLADE 6.4 (BLADE) ×3 IMPLANT
ELECT REM PT RETURN 9FT ADLT (ELECTROSURGICAL) ×3
ELECTRODE REM PT RTRN 9FT ADLT (ELECTROSURGICAL) ×1 IMPLANT
FACESHIELD WRAPAROUND (MASK) ×3 IMPLANT
GLOVE BIO SURGEON STRL SZ 6.5 (GLOVE) ×1 IMPLANT
GLOVE BIO SURGEON STRL SZ7 (GLOVE) ×3 IMPLANT
GLOVE BIO SURGEONS STRL SZ 6.5 (GLOVE) ×1
GLOVE BIOGEL PI IND STRL 7.0 (GLOVE) ×1 IMPLANT
GLOVE BIOGEL PI IND STRL 7.5 (GLOVE) ×1 IMPLANT
GLOVE BIOGEL PI INDICATOR 7.0 (GLOVE) ×2
GLOVE BIOGEL PI INDICATOR 7.5 (GLOVE) ×2
GLOVE SS BIOGEL STRL SZ 7.5 (GLOVE) ×1 IMPLANT
GLOVE SUPERSENSE BIOGEL SZ 7.5 (GLOVE) ×2
GOWN STRL REUS W/ TWL LRG LVL3 (GOWN DISPOSABLE) ×1 IMPLANT
GOWN STRL REUS W/ TWL XL LVL3 (GOWN DISPOSABLE) ×1 IMPLANT
GOWN STRL REUS W/TWL LRG LVL3 (GOWN DISPOSABLE) ×3
GOWN STRL REUS W/TWL XL LVL3 (GOWN DISPOSABLE) ×3
HANDPIECE INTERPULSE COAX TIP (DISPOSABLE) ×3
HOOD PEEL AWAY FACE SHEILD DIS (HOOD) ×6 IMPLANT
IMMOBILIZER KNEE 22 UNIV (SOFTGOODS) ×3 IMPLANT
KIT BASIN OR (CUSTOM PROCEDURE TRAY) ×3 IMPLANT
KIT TURNOVER KIT B (KITS) ×3 IMPLANT
MANIFOLD NEPTUNE II (INSTRUMENTS) ×3 IMPLANT
MARKER SKIN DUAL TIP RULER LAB (MISCELLANEOUS) ×3 IMPLANT
NEEDLE HYPO 22GX1.5 SAFETY (NEEDLE) ×6 IMPLANT
NS IRRIG 1000ML POUR BTL (IV SOLUTION) ×3 IMPLANT
PACK TOTAL JOINT (CUSTOM PROCEDURE TRAY) ×3 IMPLANT
PAD ARMBOARD 7.5X6 YLW CONV (MISCELLANEOUS) ×6 IMPLANT
SET HNDPC FAN SPRY TIP SCT (DISPOSABLE) ×1 IMPLANT
STRIP CLOSURE SKIN 1/2X4 (GAUZE/BANDAGES/DRESSINGS) ×2 IMPLANT
SUCTION FRAZIER HANDLE 10FR (MISCELLANEOUS) ×2
SUCTION TUBE FRAZIER 10FR DISP (MISCELLANEOUS) ×1 IMPLANT
SUT MNCRL AB 3-0 PS2 18 (SUTURE) ×3 IMPLANT
SUT VIC AB 0 CT1 27 (SUTURE) ×6
SUT VIC AB 0 CT1 27XBRD ANBCTR (SUTURE) ×2 IMPLANT
SUT VIC AB 1 CT1 27 (SUTURE) ×3
SUT VIC AB 1 CT1 27XBRD ANBCTR (SUTURE) ×1 IMPLANT
SUT VIC AB 2-0 CT1 27 (SUTURE) ×6
SUT VIC AB 2-0 CT1 TAPERPNT 27 (SUTURE) ×2 IMPLANT
SYR CONTROL 10ML LL (SYRINGE) ×6 IMPLANT
TOWEL OR 17X24 6PK STRL BLUE (TOWEL DISPOSABLE) ×3 IMPLANT
TOWEL OR 17X26 10 PK STRL BLUE (TOWEL DISPOSABLE) ×3 IMPLANT
TRAY CATH 16FR W/PLASTIC CATH (SET/KITS/TRAYS/PACK) IMPLANT
TRAY FOLEY CATH SILVER 16FR (SET/KITS/TRAYS/PACK) ×3 IMPLANT
WATER STERILE IRR 1000ML POUR (IV SOLUTION) ×3 IMPLANT

## 2017-11-26 NOTE — Anesthesia Postprocedure Evaluation (Signed)
Anesthesia Post Note  Patient: Victoria [name]  Procedure(s) Performed: RIGHT TOTAL KNEE ARTHROPLASTY (Right Knee)     Patient location during evaluation: PACU Anesthesia Type: Regional and General Level of consciousness: awake and alert and oriented Pain management: pain level controlled Vital Signs Assessment: post-procedure vital signs reviewed and stable Respiratory status: spontaneous breathing, nonlabored ventilation, respiratory function stable and patient connected to nasal cannula oxygen Cardiovascular status: blood pressure returned to baseline and stable Postop Assessment: no apparent nausea or vomiting Anesthetic complications: no    Last Vitals:  Vitals:   11/26/17 0940 11/26/17 1000  BP: (!) 159/76   Pulse: 86 79  Resp: (!) 30 19  Temp: 36.5 C   SpO2: 96% 95%    Last Pain:  Vitals:   11/26/17 1021  TempSrc:   PainSc: 6                  Corion Sherrod A.

## 2017-11-26 NOTE — Transfer of Care (Signed)
Immediate Anesthesia Transfer of Care Note  Patient: Victoria [name]  Procedure(s) Performed: RIGHT TOTAL KNEE ARTHROPLASTY (Right Knee)  Patient Location: PACU  Anesthesia Type:General  Level of Consciousness: awake, patient cooperative and responds to stimulation  Airway & Oxygen Therapy: Patient Spontanous Breathing and Patient connected to face mask oxygen  Post-op Assessment: Report given to RN, Post -op Vital signs reviewed and stable and Patient moving all extremities X 4  Post vital signs: Reviewed and stable  Last Vitals:  Vitals Value Taken Time  BP 159/76 11/26/2017  9:40 AM  Temp    Pulse 86 11/26/2017  9:41 AM  Resp 23 11/26/2017  9:41 AM  SpO2 96 % 11/26/2017  9:41 AM  Vitals shown include unvalidated device data.  Last Pain:  Vitals:   11/26/17 0601  TempSrc:   PainSc: 0-No pain      Patients Stated Pain Goal: 3 (19/41/74 0814)  Complications: No apparent anesthesia complications

## 2017-11-26 NOTE — Evaluation (Signed)
Physical Therapy Evaluation Patient Details Name: Victoria [name] MRN: [id number] DOB: [birth year] Today's Date: 11/26/2017   History of Present Illness  Pt is a 62 y.o female who presents s/p R TKA on 11/26/17. PMH significant for hypothyroidism and DM.   Clinical Impression  Pt is s/p TKA resulting in the deficits listed below (see PT Problem List). At the time of PT eval pt was able to perform transfers with gross min guard assist to min assist for balance support and safety. Pt limited by N/V but demonstrated good control of the RLE. Anticipate pt will progress well with post-op mobility. Pt will benefit from skilled PT to increase their independence and safety with mobility to allow discharge to the venue listed below.      Follow Up Recommendations Follow surgeon's recommendation for DC plan and follow-up therapies    Equipment Recommendations  None recommended by PT    Recommendations for Other Services       Precautions / Restrictions Precautions Precautions: Fall;Knee Precaution Comments: Pt and daughter were educated on resting in extension. NO pillow/roll/ice pack under knee, and use of towel roll or bone foam under heel.  Required Braces or Orthoses: Knee Immobilizer - Right Knee Immobilizer - Right: Other (comment)(Discontinue when pt demonstrates good quad control) Restrictions Weight Bearing Restrictions: Yes RLE Weight Bearing: Weight bearing as tolerated      Mobility  Bed Mobility Overal bed mobility: Needs Assistance Bed Mobility: Supine to Sit     Supine to sit: Min assist     General bed mobility comments: Assist for LE advancement towards EOB and trunk support as she scooted out to get feet on floor.   Transfers Overall transfer level: Needs assistance Equipment used: Rolling walker (2 wheeled) Transfers: Sit to/from [facility] Sit to Stand: Min assist Stand pivot transfers: Min assist       General transfer comment: Assist to  power-up to full stand. Pt was cued for hand placement on seated surface for safety. Balance support provided as pt took a few pivotal steps around to the chair.   Ambulation/Gait             General Gait Details: Deferred as pt feeling nauseated, and vomited once in the chair.   Stairs            Wheelchair Mobility    Modified Rankin (Stroke Patients Only)       Balance Overall balance assessment: Needs assistance Sitting-balance support: Feet supported;No upper extremity supported Sitting balance-Leahy Scale: Fair     Standing balance support: Bilateral upper extremity supported;During functional activity Standing balance-Leahy Scale: Poor Standing balance comment: Reliant on UE suport                             Pertinent Vitals/Pain Pain Assessment: Faces Faces Pain Scale: Hurts even more Pain Location: R knee Pain Descriptors / Indicators: Operative site guarding;Discomfort Pain Intervention(s): Limited activity within patient's tolerance;Monitored during session;Repositioned    Home Living Family/patient expects to be discharged to:: Private residence Living Arrangements: Alone Available Help at Discharge: Family;Available 24 hours/day Type of Home: House Home Access: Stairs to enter   [employer] of Steps: 3 Home Layout: Two level Home Equipment: Walker - 2 wheels;Bedside commode      Prior Function Level of Independence: Independent               Hand Dominance  Extremity/Trunk Assessment   Upper Extremity Assessment Upper Extremity Assessment: Defer to OT evaluation    Lower Extremity Assessment Lower Extremity Assessment: RLE deficits/detail RLE Deficits / Details: Decreased strength and AROM consistent with above mentioned procedure.  RLE: Unable to fully assess due to pain    Cervical / Trunk Assessment Cervical / Trunk Assessment: Normal  Communication   Communication: No difficulties   Cognition Arousal/Alertness: Awake/alert Behavior During Therapy: WFL for tasks assessed/performed Overall Cognitive Status: Within Functional Limits for tasks assessed                                        General Comments      Exercises Total Joint Exercises Ankle Circles/Pumps: 10 reps   Assessment/Plan    PT Assessment Patient needs continued PT services  PT Problem List Decreased strength;Decreased range of motion;Decreased activity tolerance;Decreased balance;Decreased mobility;Decreased knowledge of use of DME;Decreased safety awareness;Decreased knowledge of precautions;Pain       PT Treatment Interventions DME instruction;Gait training;Stair training;Functional mobility training;Therapeutic activities;Therapeutic exercise;Neuromuscular re-education;Patient/family education    PT Goals (Current goals can be found in the Care Plan section)  Acute Rehab PT Goals Patient Stated Goal: Home tomorrow PT Goal Formulation: With patient/family Time For Goal Achievement: 12/03/17 Potential to Achieve Goals: Good    Frequency 7X/week   Barriers to discharge        Co-evaluation               AM-PAC PT "6 Clicks" Daily Activity  Outcome Measure Difficulty turning over in bed (including adjusting bedclothes, sheets and blankets)?: Unable Difficulty moving from lying on back to sitting on the side of the bed? : Unable Difficulty sitting down on and standing up from a chair with arms (e.g., wheelchair, bedside commode, etc,.)?: Unable Help needed moving to and from a bed to chair (including a wheelchair)?: A Little Help needed walking in hospital room?: A Little Help needed climbing 3-5 steps with a railing? : A Lot 6 Click Score: 11    End of Session Equipment Utilized During Treatment: Gait belt;Right knee immobilizer Activity Tolerance: Other (comment)(Limited by nausea/vomiting) Patient left: in chair;with family/visitor present Nurse  Communication: Mobility status PT Visit Diagnosis: Unsteadiness on feet (R26.81);Pain;Other abnormalities of gait and mobility (R26.89) Pain - Right/Left: Right Pain - part of body: Knee    Time: 1412-1440 PT Time Calculation (min) (ACUTE ONLY): 28 min   Charges:   PT Evaluation $PT Eval Moderate Complexity: 1 Mod PT Treatments $Gait Training: 8-22 mins   PT G Codes:        Rolinda Roan, PT, DPT Acute Rehabilitation Services Pager: [phone]   Thelma Comp 11/26/2017, 3:19 PM

## 2017-11-26 NOTE — Anesthesia Procedure Notes (Signed)
Procedure Name: LMA Insertion Date/Time: 11/26/2017 7:26 AM Performed by: Glynda Jaeger, CRNA Pre-anesthesia Checklist: Patient identified, Emergency Drugs available, Suction available, Timeout performed and Patient being monitored Patient Re-evaluated:Patient Re-evaluated prior to induction Oxygen Delivery Method: Circle system utilized Preoxygenation: Pre-oxygenation with 100% oxygen Induction Type: IV induction Ventilation: Mask ventilation without difficulty LMA: LMA inserted LMA Size: 4.0 Number of attempts: 1 Tube secured with: Tape Dental Injury: Teeth and Oropharynx as per pre-operative assessment

## 2017-11-26 NOTE — Progress Notes (Signed)
Orthopedic Tech Progress Note Patient Details:  Victoria [name] 1956-03-16 [id number]  CPM Right Knee CPM Right Knee: On Right Knee Flexion (Degrees): 90 Right Knee Extension (Degrees): 0 Additional Comments: trapeze bar patient helper  Post Interventions Patient Tolerated: Well Instructions Provided: Care of device  [name] 11/26/2017, 10:33 AM Viewed order from doctor's order list

## 2017-11-26 NOTE — Op Note (Signed)
MRN:     [id number] DOB/AGE:    Feb 16, 1956 / 62 y.o.       OPERATIVE REPORT   DATE OF PROCEDURE:  11/26/2017      PREOPERATIVE DIAGNOSIS:   Primary Localized Osteoarthritis right Knee       Estimated body mass index is 34.57 kg/m as calculated from the following:   Height as of this encounter: 5\' 2"  (1.575 m).   Weight as of this encounter: 85.7 kg (189 lb).                                                       POSTOPERATIVE DIAGNOSIS:   Same                                                                 PROCEDURE:  Procedure(s): RIGHT TOTAL KNEE ARTHROPLASTY Using Depuy Attune RP implants #4 Femur, #4Tibia, 39mm  RP bearing, 32 Patella    SURGEON: Ludwin Flahive A. Noemi Chapel, MD   ASSISTANT: Matthew Saras, PA-C, present and scrubbed throughout the case, critical for retraction, instrumentation, and closure.  ANESTHESIA: GET with Adductor Nerve Block  TOURNIQUET TIME: 70 minutes   COMPLICATIONS:  None       SPECIMENS: None   INDICATIONS FOR PROCEDURE: The patient has djd of the knee with varus deformities, XR shows bone on bone arthritis. Patient has failed all conservative measures including anti-inflammatory medicines, narcotics, attempts at exercise and weight loss, cortisone injections and viscosupplementation.  Risks and benefits of surgery have been discussed, questions answered.    DESCRIPTION OF PROCEDURE: The patient identified by armband, received right adductor canal block and IV antibiotics, in the holding area at [facility]. Patient taken to the operating room, appropriate anesthetic monitors were attached. General anesthesia induced with the patient in supine position, Foley catheter was inserted. Tourniquet applied high to the operative thigh. Lateral post and foot positioner applied to the table, the lower extremity was then prepped and draped in usual sterile fashion from the ankle to the tourniquet. Time-out procedure was performed. The limb was wrapped with an  Esmarch bandage and the tourniquet inflated to 365 mmHg.   We began the operation by making a 6cm anterior midline incision. Small bleeders in the skin and the subcutaneous tissue identified and cauterized. Transverse retinaculum was incised and reflected medially and a medial parapatellar arthrotomy was accomplished. the patella was everted and theprepatellar fat pad resected. The superficial medial collateral ligament was then elevated from anterior to posterior along the proximal flare of the tibia and anterior half of the menisci resected. The knee was hyperflexed exposing bone on bone arthritis. Peripheral and notch osteophytes as well as the cruciate ligaments were then resected. We continued to work our way around posteriorly along the proximal tibia, and externally rotated the tibia subluxing it out from underneath the femur. A McHale retractor was placed through the notch and a lateral Hohmann retractor placed, and an external tibial guide was placed.  The tibial cutting guide was pinned into place allowing resection of 4 mm of bone medially and about 6 mm of bone laterally  because of her varus deformity.   Satisfied with the tibial resection, we then entered the distal femur 2 mm anterior to the PCL origin with the intramedullary guide rod and applied the distal femoral cutting guide set at 59mm, with 5 degrees of valgus. This was pinned along the epicondylar axis. At this point, the distal femoral cut was accomplished without difficulty. We then sized for a 4 femoral component and pinned the guide in 3 degrees of external rotation.The chamfer cutting guide was pinned into place. The anterior, posterior, and chamfer cuts were accomplished without difficulty followed by the  RP box cutting guide and the box cut. We also removed posterior osteophytes from the posterior femoral condyles. At this time, the knee was brought into full extension. We checked our extension and flexion gaps and found them  symmetric at 7.  The patella thickness measured at 54m m. We set the cutting guide at 15 and removed the posterior patella sized for 32 button and drilled the lollipop. The knee was then once again hyperflexed exposing the proximal tibia. We sized for a # 4 tibial base plate, applied the smokestack and the conical reamer followed by the the Delta fin keel punch. We then hammered into place the  RP trial femoral component, inserted a trial bearing, trial patellar button, and took the knee through range of motion from 0-130 degrees. No thumb pressure was required for patellar tracking.   At this point, all trial components were removed, a double batch of DePuy HV cement with Zinecef 150-0mg    was mixed and applied to all bony metallic mating surfaces. In order, we hammered into place the tibial tray and removed excess cement, the femoral component and removed excess cement, a 7 mm  RP bearing was inserted, and the knee brought to full extension with compression. The patellar button was clamped into place, and excess cement removed. While the cement cured the wound was irrigated out with normal saline solution pulse lavage, and exparel was injected throughout the knee. Ligament stability and patellar tracking were checked and found to be excellent..   The parapatellar arthrotomy was closed with  #1 Vicryl suture. The subcutaneous tissue with 0 and 2-0 undyed Vicryl suture, and 4-0 Monocryl.. A dressing of Aquaseal, 4 x 4, dressing sponges, Webril, and Ace wrap applied. Needle and sponge count were correct times 2.The patient awakened, extubated, and taken to recovery room without difficulty. Vascular status was normal, pulses 2+ and symmetric.    Lorn Junes 09/17/2017, 8:56 AM

## 2017-11-26 NOTE — Interval H&P Note (Signed)
History and Physical Interval Note:  11/26/2017 7:04 AM  Victoria [name]  has presented today for surgery, with the diagnosis of DJD RIGHT KNEE  The various methods of treatment have been discussed with the patient and family. After consideration of risks, benefits and other options for treatment, the patient has consented to  Procedure(s): RIGHT TOTAL KNEE ARTHROPLASTY (Right) as a surgical intervention .  The patient's history has been reviewed, patient examined, no change in status, stable for surgery.  I have reviewed the patient's chart and labs.  Questions were answered to the patient's satisfaction.     Lorn Junes

## 2017-11-26 NOTE — Anesthesia Procedure Notes (Addendum)
Anesthesia Regional Block: Adductor canal block   Pre-Anesthetic Checklist: ,, timeout performed, Correct Patient, Correct Site, Correct Laterality, Correct Procedure, Correct Position, site marked, Risks and benefits discussed,  Surgical consent,  Pre-op evaluation,  At surgeon's request and post-op pain management  Laterality: Right  Prep: chloraprep       Needles:  Injection technique: Single-shot  Needle Type: Echogenic Stimulator Needle     Needle Length: 9cm  Needle Gauge: 21   Needle insertion depth: 9 cm   Additional Needles:   Procedures:,,,, ultrasound used (permanent image in chart),,,,  Narrative:  Start time: 11/26/2017 6:53 AM End time: 11/26/2017 6:58 AM Injection made incrementally with aspirations every 5 mL.  Performed by: Personally  Anesthesiologist: Josephine Igo, MD  Additional Notes: Timeout performed. Patient sedated. Relevant anatomy ID'd using [place]. Incremental 2-36ml injection of LA with frequent aspiration. Patient tolerated procedure well.        Right Adductor Canal Block

## 2017-11-27 ENCOUNTER — Encounter (HOSPITAL_COMMUNITY): Payer: Self-pay | Admitting: General Practice

## 2017-11-27 DIAGNOSIS — Z6834 Body mass index (BMI) 34.0-34.9, adult: Secondary | ICD-10-CM | POA: Diagnosis not present

## 2017-11-27 DIAGNOSIS — E039 Hypothyroidism, unspecified: Secondary | ICD-10-CM | POA: Diagnosis present

## 2017-11-27 DIAGNOSIS — E785 Hyperlipidemia, unspecified: Secondary | ICD-10-CM | POA: Diagnosis present

## 2017-11-27 DIAGNOSIS — Z79899 Other long term (current) drug therapy: Secondary | ICD-10-CM | POA: Diagnosis not present

## 2017-11-27 DIAGNOSIS — Z7982 Long term (current) use of aspirin: Secondary | ICD-10-CM | POA: Diagnosis not present

## 2017-11-27 DIAGNOSIS — E1100 Type 2 diabetes mellitus with hyperosmolarity without nonketotic hyperglycemic-hyperosmolar coma (NKHHC): Secondary | ICD-10-CM | POA: Diagnosis present

## 2017-11-27 DIAGNOSIS — M1711 Unilateral primary osteoarthritis, right knee: Secondary | ICD-10-CM | POA: Diagnosis present

## 2017-11-27 DIAGNOSIS — E669 Obesity, unspecified: Secondary | ICD-10-CM | POA: Diagnosis present

## 2017-11-27 DIAGNOSIS — I10 Essential (primary) hypertension: Secondary | ICD-10-CM | POA: Diagnosis present

## 2017-11-27 DIAGNOSIS — E1140 Type 2 diabetes mellitus with diabetic neuropathy, unspecified: Secondary | ICD-10-CM | POA: Diagnosis present

## 2017-11-27 DIAGNOSIS — Z791 Long term (current) use of non-steroidal anti-inflammatories (NSAID): Secondary | ICD-10-CM | POA: Diagnosis not present

## 2017-11-27 DIAGNOSIS — Z794 Long term (current) use of insulin: Secondary | ICD-10-CM | POA: Diagnosis not present

## 2017-11-27 LAB — URINE CULTURE: CULTURE: NO GROWTH

## 2017-11-27 LAB — CBC
HEMATOCRIT: 31.9 % — AB (ref 36.0–46.0)
Hemoglobin: 10.8 g/dL — ABNORMAL LOW (ref 12.0–15.0)
MCH: 27.4 pg (ref 26.0–34.0)
MCHC: 33.9 g/dL (ref 30.0–36.0)
MCV: 81 fL (ref 78.0–100.0)
Platelets: 266 10*3/uL (ref 150–400)
RBC: 3.94 MIL/uL (ref 3.87–5.11)
RDW: 13.5 % (ref 11.5–15.5)
WBC: 17.6 10*3/uL — AB (ref 4.0–10.5)

## 2017-11-27 LAB — BASIC METABOLIC PANEL
ANION GAP: 7 (ref 5–15)
BUN: 14 mg/dL (ref 6–20)
CO2: 23 mmol/L (ref 22–32)
Calcium: 8.4 mg/dL — ABNORMAL LOW (ref 8.9–10.3)
Chloride: 109 mmol/L (ref 101–111)
Creatinine, Ser: 0.86 mg/dL (ref 0.44–1.00)
GLUCOSE: 239 mg/dL — AB (ref 65–99)
POTASSIUM: 4.6 mmol/L (ref 3.5–5.1)
Sodium: 139 mmol/L (ref 135–145)

## 2017-11-27 LAB — GLUCOSE, CAPILLARY
GLUCOSE-CAPILLARY: 423 mg/dL — AB (ref 65–99)
GLUCOSE-CAPILLARY: 88 mg/dL (ref 65–99)
Glucose-Capillary: 207 mg/dL — ABNORMAL HIGH (ref 65–99)
Glucose-Capillary: 381 mg/dL — ABNORMAL HIGH (ref 65–99)
Glucose-Capillary: 389 mg/dL — ABNORMAL HIGH (ref 65–99)

## 2017-11-27 MED ORDER — METOCLOPRAMIDE HCL 5 MG PO TABS: 15.0000 mg | ORAL_TABLET | Freq: Three times a day (TID) | ORAL | 0 refills | Status: DC | PRN

## 2017-11-27 MED ORDER — INSULIN NPH (HUMAN) (ISOPHANE) 100 UNIT/ML ~~LOC~~ SUSP
20.0000 [IU] | Freq: Once | SUBCUTANEOUS | Status: AC
Start: 2017-11-27 — End: 2017-11-27
  Administered 2017-11-27: 20 [IU] via SUBCUTANEOUS

## 2017-11-27 MED ORDER — INSULIN NPH (HUMAN) (ISOPHANE) 100 UNIT/ML ~~LOC~~ SUSP
25.0000 [IU] | Freq: Three times a day (TID) | SUBCUTANEOUS | Status: DC
  Administered 2017-11-27 – 2017-11-28 (×2): 25 [IU] via SUBCUTANEOUS

## 2017-11-27 MED ORDER — ASPIRIN 325 MG PO TBEC: DELAYED_RELEASE_TABLET | ORAL | 0 refills | Status: DC

## 2017-11-27 MED ORDER — OXYCODONE HCL 5 MG PO TABS: ORAL_TABLET | ORAL | 0 refills | Status: DC

## 2017-11-27 MED ORDER — INSULIN NPH (HUMAN) (ISOPHANE) 100 UNIT/ML ~~LOC~~ SUSP
20.0000 [IU] | Freq: Three times a day (TID) | SUBCUTANEOUS | Status: DC
  Filled 2017-11-27: qty 10

## 2017-11-27 MED ORDER — MELOXICAM 15 MG PO TABS: ORAL_TABLET | ORAL | 0 refills | Status: DC

## 2017-11-27 MED ORDER — GABAPENTIN 300 MG PO CAPS: ORAL_CAPSULE | ORAL | 0 refills | Status: DC

## 2017-11-27 MED ORDER — POLYETHYLENE GLYCOL 3350 17 G PO PACK: PACK | ORAL | 0 refills | Status: DC

## 2017-11-27 MED ORDER — DOCUSATE SODIUM 100 MG PO CAPS: ORAL_CAPSULE | ORAL | 0 refills | Status: DC

## 2017-11-27 NOTE — Progress Notes (Signed)
Physical Therapy Treatment Patient Details Name: Victoria [name] MRN: [id number] DOB: 03/20/1956 Today's Date: 11/27/2017    History of Present Illness Pt is a 62 y.o female who presents s/p R TKA on 11/26/17. PMH significant for hypothyroidism and DM.     PT Comments    Pt performed gait training and functional mobility during session this pm.  Pt able to progress gait distance and retrialed stair negotiation with improved accuracy.   Pt remains with elevated blood glucose levels but continues to progress well with therapy.  Pt remains anxious to return home.    Follow Up Recommendations  Follow surgeon's recommendation for DC plan and follow-up therapies     Equipment Recommendations  None recommended by PT    Recommendations for Other Services       Precautions / Restrictions Precautions Precautions: Fall;Knee Precaution Comments: Pt and daughter were educated on resting in extension. NO pillow/roll/ice pack under knee, and use of towel roll or bone foam under heel.  Restrictions Weight Bearing Restrictions: Yes RLE Weight Bearing: Weight bearing as tolerated    Mobility  Bed Mobility               General bed mobility comments: Pt in recliner on arrival.    Transfers Overall transfer level: Needs assistance Equipment used: Rolling walker (2 wheeled) Transfers: Sit to/from Stand Sit to Stand: Supervision         General transfer comment: Cues for hand palcement and to keep RW close when back to a recliner chair.    Ambulation/Gait Ambulation/Gait assistance: Min guard Ambulation Distance (Feet): 650 Feet Assistive device: Rolling walker (2 wheeled) Gait Pattern/deviations: Step-through pattern;Decreased stride length;Trunk flexed     General Gait Details: Cues for upper trunk and head control.  Cues for symmetry with step through pattern.     Stairs Stairs: Yes Stairs assistance: Supervision Stair Management: One rail Left;Forwards;Step to  pattern Number of Stairs: 7 General stair comments: Able to recall sequencing, supervision provided for safety.     Wheelchair Mobility    Modified Rankin (Stroke Patients Only)       Balance Overall balance assessment: Needs assistance   Sitting balance-Leahy Scale: Fair       Standing balance-Leahy Scale: Fair Standing balance comment: Reliant on UE suport                            Cognition Arousal/Alertness: Awake/alert Behavior During Therapy: WFL for tasks assessed/performed Overall Cognitive Status: Within Functional Limits for tasks assessed                                        Exercises Total Joint Exercises Knee Flexion: AROM;AAROM;5 reps(1x5 AROM and 1x5 AAROM for a total of 10 reps.  )    General Comments        Pertinent Vitals/Pain Pain Assessment: 0-10 Pain Score: 6  Pain Location: R knee Pain Descriptors / Indicators: Operative site guarding;Discomfort Pain Intervention(s): Monitored during session;Repositioned;Ice applied    Home Living                      Prior Function            PT Goals (current goals can now be found in the care plan section) Acute Rehab PT Goals Patient Stated Goal: Home tomorrow Potential to Achieve  Goals: Good Progress towards PT goals: Progressing toward goals    Frequency    7X/week      PT Plan Current plan remains appropriate    Co-evaluation              AM-PAC PT "6 Clicks" Daily Activity  Outcome Measure  Difficulty turning over in bed (including adjusting bedclothes, sheets and blankets)?: Unable Difficulty moving from lying on back to sitting on the side of the bed? : Unable Difficulty sitting down on and standing up from a chair with arms (e.g., wheelchair, bedside commode, etc,.)?: Unable Help needed moving to and from a bed to chair (including a wheelchair)?: A Little Help needed walking in hospital room?: A Little Help needed climbing 3-5  steps with a railing? : A Little 6 Click Score: 12    End of Session Equipment Utilized During Treatment: Gait belt Activity Tolerance: Patient tolerated treatment well Patient left: in chair;with call bell/phone within reach;with family/visitor present Nurse Communication: Mobility status PT Visit Diagnosis: Unsteadiness on feet (R26.81);Pain;Other abnormalities of gait and mobility (R26.89) Pain - Right/Left: Right Pain - part of body: Knee     Time: 5638-7564 PT Time Calculation (min) (ACUTE ONLY): 18 min  Charges:  $Gait Training: 8-22 mins                    G Codes:       Governor Rooks, PTA pager [place] 11/27/2017, 5:37 PM

## 2017-11-27 NOTE — Progress Notes (Signed)
Physical Therapy Treatment Patient Details Name: Victoria [name] MRN: [id number] DOB: [birth year] Today's Date: 11/27/2017    History of Present Illness Pt is a 62 y.o female who presents s/p R TKA on 11/26/17. PMH significant for hypothyroidism and DM.     PT Comments    Pt performed gait training, stair training and reviewed HEP in prep for d/c home.  Pt blood sugar 425 pre tx but no symptoms present.  RN aware.  Will f/u if patient remains hospitalized otherwise she is ready to d/c home from a mobility stand point. HEP issued and reviewed.    Follow Up Recommendations  Follow surgeon's recommendation for DC plan and follow-up therapies     Equipment Recommendations  None recommended by PT    Recommendations for Other Services       Precautions / Restrictions Precautions Precautions: Fall;Knee Precaution Comments: Pt and daughter were educated on resting in extension. NO pillow/roll/ice pack under knee, and use of towel roll or bone foam under heel.  Restrictions Weight Bearing Restrictions: Yes RLE Weight Bearing: Weight bearing as tolerated    Mobility  Bed Mobility               General bed mobility comments: Pt in recliner on arrival.    Transfers Overall transfer level: Needs assistance Equipment used: Rolling walker (2 wheeled) Transfers: Sit to/from Stand Sit to Stand: Min guard         General transfer comment: Cues for hand palcement and to keep RW close when back to a recliner chair.    Ambulation/Gait Ambulation/Gait assistance: Min assist Ambulation Distance (Feet): 250 Feet Assistive device: Rolling walker (2 wheeled) Gait Pattern/deviations: Step-through pattern;Decreased stride length;Trunk flexed     General Gait Details: Cues for upper trunk and head control.  Cues for symmetry with step through pattern.     Stairs Stairs: Yes Stairs assistance: Min guard Stair Management: One rail Left;Forwards;Step to pattern Number of Stairs:  7 General stair comments: Cues for sequencing and hand placement on railing.     Wheelchair Mobility    Modified Rankin (Stroke Patients Only)       Balance Overall balance assessment: Needs assistance Sitting-balance support: Feet supported;No upper extremity supported Sitting balance-Leahy Scale: Fair     Standing balance support: Bilateral upper extremity supported;During functional activity Standing balance-Leahy Scale: Poor Standing balance comment: Reliant on UE suport                            Cognition Arousal/Alertness: Awake/alert Behavior During Therapy: WFL for tasks assessed/performed Overall Cognitive Status: Within Functional Limits for tasks assessed                                        Exercises Total Joint Exercises Ankle Circles/Pumps: AROM;Both;20 reps;Supine Quad Sets: AROM;Right;10 reps;Supine Towel Squeeze: AROM;Right;10 reps;Supine Short Arc Quad: AROM;Right;10 reps;Supine Heel Slides: AROM;Right;10 reps;Supine Hip ABduction/ADduction: AROM;Right;10 reps;Supine Straight Leg Raises: AROM;Right;10 reps;Supine Long Arc Quad: AROM;Right;10 reps;Seated Goniometric ROM: 80 degrees flexion in R knee.      General Comments        Pertinent Vitals/Pain Pain Assessment: 0-10 Pain Score: 6  Pain Location: R knee Pain Descriptors / Indicators: Operative site guarding;Discomfort Pain Intervention(s): Monitored during session;Repositioned;Ice applied    Home Living  Prior Function            PT Goals (current goals can now be found in the care plan section) Acute Rehab PT Goals Patient Stated Goal: Home tomorrow PT Goal Formulation: With patient/family Potential to Achieve Goals: Good    Frequency    7X/week      PT Plan Current plan remains appropriate    Co-evaluation              AM-PAC PT "6 Clicks" Daily Activity  Outcome Measure  Difficulty turning over in  bed (including adjusting bedclothes, sheets and blankets)?: Unable Difficulty moving from lying on back to sitting on the side of the bed? : Unable Difficulty sitting down on and standing up from a chair with arms (e.g., wheelchair, bedside commode, etc,.)?: Unable Help needed moving to and from a bed to chair (including a wheelchair)?: A Little Help needed walking in hospital room?: A Little Help needed climbing 3-5 steps with a railing? : A Little 6 Click Score: 12    End of Session Equipment Utilized During Treatment: Gait belt Activity Tolerance: Patient tolerated treatment well Patient left: in chair;with call bell/phone within reach;with family/visitor present Nurse Communication: Mobility status PT Visit Diagnosis: Unsteadiness on feet (R26.81);Pain;Other abnormalities of gait and mobility (R26.89) Pain - Right/Left: Right Pain - part of body: Knee     Time: 1221-1237 PT Time Calculation (min) (ACUTE ONLY): 16 min  Charges:  $Gait Training: 8-22 mins                    G Codes:       Governor Rooks, PTA pager [phone]    Cristela Blue 11/27/2017, 1:12 PM

## 2017-11-27 NOTE — Progress Notes (Signed)
Subjective: 1 Day Post-Op Procedure(s) (LRB): RIGHT TOTAL KNEE ARTHROPLASTY (Right) Patient reports pain as 5 on 0-10 scale.    Objective: Vital signs in last 24 hours: Temp:  [97.6 F (36.4 C)-98.2 F (36.8 C)] 98 F (36.7 C) (06/04 0611) Pulse Rate:  [73-84] 73 (06/04 0611) Resp:  [16] 16 (06/04 0611) BP: (128-153)/(67-75) 153/75 (06/04 0611) SpO2:  [92 %-98 %] 92 % (06/04 0611)  Intake/Output from previous day: 06/03 0701 - 06/04 0700 In: 1821.7 [P.O.:480; I.V.:1241.7; IV Piggyback:100] Out: 2385 [Urine:1875; Emesis/NG output:500; Blood:10] Intake/Output this shift: Total I/O In: 240 [P.O.:240] Out: 200 [Urine:200]  Recent Labs    11/27/17 0640  HGB 10.8*   Recent Labs    11/27/17 0640  WBC 17.6*  RBC 3.94  HCT 31.9*  PLT 266   Recent Labs    11/27/17 0640  NA 139  K 4.6  CL 109  CO2 23  BUN 14  CREATININE 0.86  GLUCOSE 239*  CALCIUM 8.4*   No results for input(s): LABPT, INR in the last 72 hours.  ABD soft Neurovascular intact Sensation intact distally Intact pulses distally Incision: dressing C/D/I  Anticipated LOS equal to or greater than 2 midnights due to - Age 64 and older with one or more of the following:  - Obesity  - Expected need for hospital services (PT, OT, Nursing) required for safe  discharge  - Anticipated need for postoperative skilled nursing care or inpatient rehab  - Active co-morbidities: Diabetes OR   - Unanticipated findings during/Post Surgery: Lab abnormalities  - Patient is a high risk of re-admission due to: patient's A1c preop was 7.5    It is now 8.3    She has not been controlling her sugars well   Assessment/Plan: 1 Day Post-Op Procedure(s) (LRB): RIGHT TOTAL KNEE ARTHROPLASTY (Right)  Principal Problem:   Primary localized osteoarthritis of right knee Active Problems:   Hypothyroidism   OBESITY   Benign essential HTN   CMC arthritis   Diabetic hyperosmolar non-ketotic state ([place])   DOE (dyspnea on  exertion)  Discharge Cancelled due to CBG greater than 400 now at 12:30 pm   I have increased her NPH to 20 units TID and she is also given 20 units of a sliding scale Advance diet Up with therapy    Kenslie Abbruzzese J Anay Walter 11/27/2017, 12:56 PM

## 2017-11-27 NOTE — Progress Notes (Signed)
Inpatient Diabetes Program Recommendations  AACE/ADA: New Consensus Statement on Inpatient Glycemic Control (2015)  Target Ranges:  Prepandial:   less than 140 mg/dL      Peak postprandial:   less than 180 mg/dL (1-2 hours)      Critically ill patients:  140 - 180 mg/dL   Results for Victoria [name], Victoria [name] (MRN [id number]) as of 11/27/2017 09:56  Ref. Range 11/26/2017 09:40 11/26/2017 12:53 11/26/2017 16:41 11/26/2017 21:57 11/27/2017 06:11  Glucose-Capillary Latest Ref Range: 65 - 99 mg/dL 289 (H) 357 (H) 353 (H) 313 (H) 207 (H)   Review of Glycemic Control  Diabetes history: DM 2 Outpatient Diabetes medications: NPH 16 units tid, Novolin R 9-18 units tid Current orders for Inpatient glycemic control: NPH 16 units tid, Novolog Resistant Correction 0-20 units, Novolog HS scale  A1c 8.3% on 6/3  Inpatient Diabetes Program Recommendations:    Patient receiving Decadron 10 mg Q8 hours. Glucose in the 200-300 range. Consider increasing NPH to 18 units tid while on steroids. May also consider Novolog 4 units tid Meal coverage in addition to correction scale if patient consumes at least 50% of meals.  Thanks,  Tama Headings RN, MSN, BC-ADM, [facility] Inpatient Diabetes Coordinator Team Pager [phone] (8a-5p)

## 2017-11-27 NOTE — Discharge Summary (Addendum)
Patient ID: Victoria [name] MRN: [id number] DOB/AGE: [birth year] 62 y.o.  Admit date: 11/26/2017 Discharge date: 11/28/2017  Admission Diagnoses:  Principal Problem:   Primary localized osteoarthritis of right knee Active Problems:   Hypothyroidism   OBESITY   Benign essential HTN   CMC arthritis   Diabetic hyperosmolar non-ketotic state ([place])   DOE (dyspnea on exertion)   Discharge Diagnoses:  Same  Past Medical History:  Diagnosis Date  . Benign essential HTN   . Carpal tunnel syndrome   . Diabetes mellitus without complication ([place])   . Dyslipidemia   . Hypothyroidism   . Jaundice, unspecified, not of newborn    Scleral icterus since age 66  . Obesity, unspecified   . Other B-complex deficiencies   . Primary localized osteoarthritis of right knee 11/14/2017  . Unspecified monoarthritis, forearm     Surgeries: Procedure(s): RIGHT TOTAL KNEE ARTHROPLASTY on 11/26/2017   Consultants:   Discharged Condition: Improved  Hospital Course: Victoria [name] is an 62 y.o. female who was admitted 11/26/2017 for operative treatment ofPrimary localized osteoarthritis of right knee. Patient has severe unremitting pain that affects sleep, daily activities, and work/hobbies. After pre-op clearance the patient was taken to the operating room on 11/26/2017 and underwent  Procedure(s): RIGHT TOTAL KNEE ARTHROPLASTY.    Patient was given perioperative antibiotics:  Anti-infectives (From admission, onward)   Start     Dose/Rate Route Frequency Ordered Stop   11/26/17 1300  ceFAZolin (ANCEF) IVPB 2g/100 mL premix     2 g 200 mL/hr over 30 Minutes Intravenous Every 6 hours 11/26/17 1009 11/26/17 2052   11/26/17 0819  cefUROXime (ZINACEF) injection  Status:  Discontinued       As needed 11/26/17 0820 11/26/17 0937   11/26/17 0600  ceFAZolin (ANCEF) IVPB 2g/100 mL premix     2 g 200 mL/hr over 30 Minutes Intravenous On call to O.R. 11/26/17 0550 11/26/17 0716   11/26/17 0556  ceFAZolin (ANCEF)  2-4 GM/100ML-% IVPB    Note to Pharmacy:  Jasmine Pang   : cabinet override      11/26/17 0556 11/26/17 0716       Patient was given sequential compression devices, early ambulation, and chemoprophylaxis to prevent DVT.  POST OP DAY ONE PATIENT HAD BLOOD SUGARS IN THE HIGH 300S AND 400.  BECAUSE OF THIS HER DISCHARGE WAS CANCELLED.  WE INCREASED HER NPH INITIALLY TO 20 UNITS TID THEN TO 25.  TODAY HER SUGARS ARE DOWN BELOW TWO HUNDRED.  I SPOKE WITH HER DAUGHTER WHO WILL INCREASE HER NPH TO 20 UNITS TID AND WATCH HER SUGARS CAREFULLY.  SHE WILL ALSO REACH OUT TO DR Buddy Duty THIS PATIENT'S ENDOCRINOLOGIST FOR OTHER GUIDANCE.  WE UNDERSTAND THE SUGARS WENT UP  DUE TO THE STRESS OF SURGERY AND THE POST OP USE OF DECADRON BUT USUALLY EVEN IN DIABETICS THIS IS NOT A NORMAL REACTION.  I THINK RAISING HER NPH WILL GIVE HER BETTER CONTROL OF HER GLUCOSE.  Patient benefited maximally from hospital stay and there were no complications.    Recent vital signs:  Patient Vitals for the past 24 hrs:  BP Temp Temp src Pulse Resp SpO2  11/28/17 0530 (!) 132/58 98.2 F (36.8 C) Oral 80 18 92 %  11/27/17 2305 (!) 125/56 98.3 F (36.8 C) Oral 82 16 94 %  11/27/17 2158 - - - - - 92 %  11/27/17 1942 (!) 114/44 98.1 F (36.7 C) Oral 91 15 91 %  11/27/17 1448 (!) 143/88  98 F (36.7 C) Oral 86 16 98 %     Recent laboratory studies:  Recent Labs    11/27/17 0640 11/28/17 0435  WBC 17.6* 17.6*  HGB 10.8* 9.6*  HCT 31.9* 29.2*  PLT 266 231  NA 139 138  K 4.6 4.3  CL 109 108  CO2 23 25  BUN 14 19  CREATININE 0.86 1.04*  GLUCOSE 239* 175*  CALCIUM 8.4* 8.1*     Discharge Medications:   Allergies as of 11/28/2017   No Known Allergies     Medication List    STOP taking these medications   aspirin 81 MG chewable tablet Replaced by:  aspirin 325 MG EC tablet   hydrochlorothiazide 12.5 MG capsule Commonly known as:  MICROZIDE   insulin aspart 100 UNIT/ML injection Commonly known as:  novoLOG    insulin glargine 100 UNIT/ML injection Commonly known as:  LANTUS   lisinopril 10 MG tablet Commonly known as:  PRINIVIL,ZESTRIL     TAKE these medications   aspirin 325 MG EC tablet 1 tab a day for the next 30 days to prevent blood clots Replaces:  aspirin 81 MG chewable tablet   aspirin-acetaminophen-caffeine 250-250-65 MG tablet Commonly known as:  EXCEDRIN MIGRAINE Take 1 tablet by mouth 2 (two) times daily as needed for headache.   CALCIUM 600+D3 600-800 MG-UNIT Tabs Generic drug:  Calcium Carb-Cholecalciferol Take 1 tablet by mouth every evening.   docusate sodium 100 MG capsule Commonly known as:  COLACE 1 tab 2 times a day while on narcotics.  STOOL SOFTENER   ferrous sulfate 325 (65 FE) MG tablet Take 325 mg by mouth every evening.   gabapentin 300 MG capsule Commonly known as:  NEURONTIN 1 tablet at bedtime daily for 30 days after surgery for nerve pain   insulin NPH Human 100 UNIT/ML injection Commonly known as:  HUMULIN N,NOVOLIN N Inject 16 Units into the skin 3 (three) times daily before meals. Draw up Novolin R (9-18 units) then draw up the Novolin N (16 units) in the same syringe   insulin regular 100 units/mL injection Commonly known as:  NOVOLIN R,HUMULIN R Inject 9-18 Units into the skin 3 (three) times daily between meals. Draw up Novolin R (9-18 units) then draw up the Novolin N (16 units) in the same syringe   levothyroxine 100 MCG tablet Commonly known as:  SYNTHROID, LEVOTHROID Take 100 mcg by mouth every other day.   levothyroxine 112 MCG tablet Commonly known as:  SYNTHROID, LEVOTHROID TAKE 1 TABLET BY MOUTH EVERY OTHER DAY   meloxicam 15 MG tablet Commonly known as:  MOBIC DO NOT TAKE UNTIL 2 WEEKS AFTER SURGERY What changed:    how much to take  how to take this  when to take this  additional instructions   metoCLOPramide 5 MG tablet Commonly known as:  REGLAN Take 3 tablets (15 mg total) by mouth every 8 (eight) hours as  needed for nausea.   metoprolol succinate 25 MG 24 hr tablet Commonly known as:  TOPROL-XL TAKE 1 TABLET BY MOUTH EVERY DAY What changed:    how much to take  how to take this  when to take this   oxyCODONE 5 MG immediate release tablet Commonly known as:  Oxy IR/ROXICODONE 1 PO Q 4 HRS PRN PAIN.   PATIENT HAD A TOTAL KNEE REPLACEMENT ON 11/26/2017.  SHE HAS BEEN TOLERATING THIS WELL IN THE HOSPITAL   polyethylene glycol packet Commonly known as:  MIRALAX / GLYCOLAX 17grams  in 6 oz of something to drink twice a day until bowel movement.  LAXITIVE.  Restart if two days since last bowel movement   simvastatin 20 MG tablet Commonly known as:  ZOCOR Take 20 mg by mouth every evening.   vitamin B-12 500 MCG tablet Commonly known as:  CYANOCOBALAMIN Take 500 mcg by mouth every evening.            Discharge Care Instructions  (From admission, onward)        Start     Ordered   11/27/17 0000  Change dressing    Comments:  DO NOT REMOVE BANDAGE OVER SURGICAL INCISION.  [place] WHOLE LEG INCLUDING OVER THE WATERPROOF BANDAGE WITH SOAP AND WATER EVERY DAY.   11/27/17 1000      Diagnostic Studies: No results found.  Disposition: Discharge disposition: 01-Home or Self Care       Discharge Instructions    CPM   Complete by:  As directed    Continuous passive motion machine (CPM):      Use the CPM from 0 to 90 for 6 hours per day.       You may break it up into 2 or 3 sessions per day.      Use CPM for 2 weeks or until you are told to stop.   Call MD / Call 911   Complete by:  As directed    If you experience chest pain or shortness of breath, CALL 911 and be transported to the hospital emergency room.  If you develope a fever above 101 F, pus (white drainage) or increased drainage or redness at the wound, or calf pain, call your surgeon's office.   Change dressing   Complete by:  As directed    DO NOT REMOVE BANDAGE OVER SURGICAL INCISION.  [place] WHOLE LEG INCLUDING  OVER THE WATERPROOF BANDAGE WITH SOAP AND WATER EVERY DAY.   Constipation Prevention   Complete by:  As directed    Drink plenty of fluids.  Prune juice may be helpful.  You may use a stool softener, such as Colace (over the counter) 100 mg twice a day.  Use MiraLax (over the counter) for constipation as needed.   Diet - low sodium heart healthy   Complete by:  As directed    Discharge instructions   Complete by:  As directed    INSTRUCTIONS AFTER JOINT REPLACEMENT   Remove items at home which could result in a fall. This includes throw rugs or furniture in walking pathways ICE to the affected joint every three hours while awake for 30 minutes at a time, for at least the first 3-5 days, and then as needed for pain and swelling.  Continue to use ice for pain and swelling. You may notice swelling that will progress down to the foot and ankle.  This is normal after surgery.  Elevate your leg when you are not up walking on it.   Continue to use the breathing machine you got in the hospital (incentive spirometer) which will help keep your temperature down.  It is common for your temperature to cycle up and down following surgery, especially at night when you are not up moving around and exerting yourself.  The breathing machine keeps your lungs expanded and your temperature down.   DIET:  As you were doing prior to hospitalization, we recommend a well-balanced diet.  DRESSING / WOUND CARE / SHOWERING  Keep the surgical dressing until follow up.  The dressing  is water proof, so you can shower without any extra covering.  IF THE DRESSING FALLS OFF or the wound gets wet inside, change the dressing with sterile gauze.  Please use good hand washing techniques before changing the dressing.  Do not use any lotions or creams on the incision until instructed by your surgeon.    ACTIVITY  Increase activity slowly as tolerated, but follow the weight bearing instructions below.   No driving for 6 weeks or  until further direction given by your physician.  You cannot drive while taking narcotics.  No lifting or carrying greater than 10 lbs. until further directed by your surgeon. Avoid periods of inactivity such as sitting longer than an hour when not asleep. This helps prevent blood clots.  You may return to work once you are authorized by your doctor.     WEIGHT BEARING   Weight bearing as tolerated with assist device (walker, cane, etc) as directed, use it as long as suggested by your surgeon or therapist, typically at least 2-3 weeks.   EXERCISES  Results after joint replacement surgery are often greatly improved when you follow the exercise, range of motion and muscle strengthening exercises prescribed by your doctor. Safety measures are also important to protect the joint from further injury. Any time any of these exercises cause you to have increased pain or swelling, decrease what you are doing until you are comfortable again and then slowly increase them. If you have problems or questions, call your caregiver or physical therapist for advice.   Rehabilitation is important following a joint replacement. After just a few days of immobilization, the muscles of the leg can become weakened and shrink (atrophy).  These exercises are designed to build up the tone and strength of the thigh and leg muscles and to improve motion. Often times heat used for twenty to thirty minutes before working out will loosen up your tissues and help with improving the range of motion but do not use heat for the first two weeks following surgery (sometimes heat can increase post-operative swelling).   These exercises can be done on a training (exercise) mat, on the floor, on a table or on a bed. Use whatever works the best and is most comfortable for you.    Use music or television while you are exercising so that the exercises are a pleasant break in your day. This will make your life better with the exercises acting  as a break in your routine that you can look forward to.   Perform all exercises about fifteen times, three times per day or as directed.  You should exercise both the operative leg and the other leg as well.   Exercises include:  Quad Sets - Tighten up the muscle on the front of the thigh (Quad) and hold for 5-10 seconds.   Straight Leg Raises - With your knee straight (if you were given a brace, keep it on), lift the leg to 60 degrees, hold for 3 seconds, and slowly lower the leg.  Perform this exercise against resistance later as your leg gets stronger.  Leg Slides: Lying on your back, slowly slide your foot toward your buttocks, bending your knee up off the floor (only go as far as is comfortable). Then slowly slide your foot back down until your leg is flat on the floor again.  Angel Wings: Lying on your back spread your legs to the side as far apart as you can without causing discomfort.  Hamstring  Strength:  Lying on your back, push your heel against the floor with your leg straight by tightening up the muscles of your buttocks.  Repeat, but this time bend your knee to a comfortable angle, and push your heel against the floor.  You may put a pillow under the heel to make it more comfortable if necessary.   A rehabilitation program following joint replacement surgery can speed recovery and prevent re-injury in the future due to weakened muscles. Contact your doctor or a physical therapist for more information on knee rehabilitation.    CONSTIPATION  Constipation is defined medically as fewer than three stools per week and severe constipation as less than one stool per week.  Even if you have a regular bowel pattern at home, your normal regimen is likely to be disrupted due to multiple reasons following surgery.  Combination of anesthesia, postoperative narcotics, change in appetite and fluid intake all can affect your bowels.   YOU MUST use at least one of the following options; they are  listed in order of increasing strength to get the job done.  They are all available over the counter, and you may need to use some, POSSIBLY even all of these options:    Drink plenty of fluids (prune juice may be helpful) and high fiber foods Colace 100 mg by mouth twice a day  Senokot for constipation as directed and as needed Dulcolax (bisacodyl), take with full glass of water  Miralax (polyethylene glycol) once or twice a day as needed.  If you have tried all these things and are unable to have a bowel movement in the first 3-4 days after surgery call either your surgeon or your primary doctor.    If you experience loose stools or diarrhea, hold the medications until you stool forms back up.  If your symptoms do not get better within 1 week or if they get worse, check with your doctor.  If you experience "the worst abdominal pain ever" or develop nausea or vomiting, please contact the office immediately for further recommendations for treatment.   ITCHING:  If you experience itching with your medications, try taking only a single pain pill, or even half a pain pill at a time.  You can also use Benadryl over the counter for itching or also to help with sleep.   TED HOSE STOCKINGS:  Use stockings on both legs until for at least 2 weeks or as directed by physician office. They may be removed at night for sleeping.  MEDICATIONS:  See your medication summary on the "After Visit Summary" that nursing will review with you.  You may have some home medications which will be placed on hold until you complete the course of blood thinner medication.  It is important for you to complete the blood thinner medication as prescribed.  PRECAUTIONS:  If you experience chest pain or shortness of breath - call 911 immediately for transfer to the hospital emergency department.   If you develop a fever greater that 101 F, purulent drainage from wound, increased redness or drainage from wound, foul odor from the  wound/dressing, or calf pain - CONTACT YOUR SURGEON.                                                   FOLLOW-UP APPOINTMENTS:  If you do not already have  a post-op appointment, please call the office for an appointment to be seen by your surgeon.  Guidelines for how soon to be seen are listed in your "After Visit Summary", but are typically between 1-4 weeks after surgery.  OTHER INSTRUCTIONS:   Knee Replacement:  Do not place pillow under knee, focus on keeping the knee straight while resting. CPM instructions: 0-90 degrees, 2 hours in the morning, 2 hours in the afternoon, and 2 hours in the evening. Place foam block, curve side up under heel at all times except when in CPM or when walking.  DO NOT modify, tear, cut, or change the foam block in any way.  MAKE SURE YOU:  Understand these instructions.  Get help right away if you are not doing well or get worse.    Thank you for letting us be a part of your medical care team.  It is a privilege we respect greatly.  We hope these instructions will help you stay on track for a fast and full recovery!   Do not put a pillow under the knee. Place it under the heel.   Complete by:  As directed    Place gray foam block, curve side up under heel at all times except when in CPM or when walking.  DO NOT modify, tear, cut, or change in any way the gray foam block.   Increase activity slowly as tolerated   Complete by:  As directed    Patient may shower   Complete by:  As directed    Aquacel dressing is water proof    Wash over it and the whole leg with soap and water at the end of your shower   TED hose   Complete by:  As directed    Use stockings (TED hose) for 2 weeks on both leg(s).  You may remove them at night for sleeping.      Follow-up Information    Elsie Saas, MD Follow up on 12/10/2017.   Specialty:  Orthopedic Surgery Why:  appointment time 2 pm with Dr Leotis Pain information: [place]. [place]  [id number] [phone]        Specialists, [facility] Follow up on 12/10/2017.   Specialty:  Orthopedic Surgery Why:  arrive at 12:30 pm for 1 pm physical therapy appointment with Lytle Michaels prior to 2 pm appointment with Dr Leotis Pain information: [place] [id number] [phone]        [facility] Follow up.   Specialty:  [facility] Contact information: [place] [address] [phone]            Signed: Linda Hedges 11/28/2017, 11:28 AM

## 2017-11-28 LAB — CBC
HCT: 29.2 % — ABNORMAL LOW (ref 36.0–46.0)
Hemoglobin: 9.6 g/dL — ABNORMAL LOW (ref 12.0–15.0)
MCH: 27 pg (ref 26.0–34.0)
MCHC: 32.9 g/dL (ref 30.0–36.0)
MCV: 82.3 fL (ref 78.0–100.0)
PLATELETS: 231 10*3/uL (ref 150–400)
RBC: 3.55 MIL/uL — ABNORMAL LOW (ref 3.87–5.11)
RDW: 13.8 % (ref 11.5–15.5)
WBC: 17.6 10*3/uL — AB (ref 4.0–10.5)

## 2017-11-28 LAB — GLUCOSE, CAPILLARY
Glucose-Capillary: 154 mg/dL — ABNORMAL HIGH (ref 65–99)
Glucose-Capillary: 155 mg/dL — ABNORMAL HIGH (ref 65–99)

## 2017-11-28 LAB — BASIC METABOLIC PANEL
ANION GAP: 5 (ref 5–15)
BUN: 19 mg/dL (ref 6–20)
CALCIUM: 8.1 mg/dL — AB (ref 8.9–10.3)
CO2: 25 mmol/L (ref 22–32)
Chloride: 108 mmol/L (ref 101–111)
Creatinine, Ser: 1.04 mg/dL — ABNORMAL HIGH (ref 0.44–1.00)
GFR calc non Af Amer: 56 mL/min — ABNORMAL LOW (ref >60)
Glucose, Bld: 175 mg/dL — ABNORMAL HIGH (ref 65–99)
Potassium: 4.3 mmol/L (ref 3.5–5.1)
SODIUM: 138 mmol/L (ref 135–145)

## 2017-11-28 NOTE — Progress Notes (Signed)
Inpatient Diabetes Program Recommendations  AACE/ADA: New Consensus Statement on Inpatient Glycemic Control (2015)  Target Ranges:  Prepandial:   less than 140 mg/dL      Peak postprandial:   less than 180 mg/dL (1-2 hours)      Critically ill patients:  140 - 180 mg/dL   Lab Results  Component Value Date   GLUCAP 155 (H) 11/28/2017   HGBA1C 8.3 (H) 11/26/2017    Review of Glycemic ControlResults for SHARLENA, KRISTENSEN (MRN [id number]) as of 11/28/2017 09:20  Ref. Range 11/27/2017 15:20 11/27/2017 16:23 11/27/2017 20:55 11/28/2017 00:37 11/28/2017 06:17  Glucose-Capillary Latest Ref Range: 65 - 99 mg/dL 381 (H) 389 (H) 88 154 (H) 155 (H)   Diabetes history: DM 2 Outpatient Diabetes medications: NPH 16 units tid, Novolin R 9-18 units tid Current orders for Inpatient glycemic control: NPH 25 units tid, Novolog Resistant Correction 0-20 units, Novolog HS scale A1c 8.3% on 6/3 Inpatient Diabetes Program Recommendations:   Note blood sugars improved since d/c of steroids.  Consider reducing NPH to 18 units tid since steroids stopped.   Thanks,  Adah Perl, RN, BC-ADM Inpatient Diabetes Coordinator Pager [phone] (8a-5p)

## 2017-11-28 NOTE — Progress Notes (Signed)
Physical Therapy Treatment Patient Details Name: Victoria [name] MRN: [id number] DOB: 1955-10-25 Today's Date: 11/28/2017    History of Present Illness Pt is a 62 y.o female who presents s/p R TKA on 11/26/17. PMH significant for hypothyroidism and DM.     PT Comments    Pt awaiting d/c today.  Reviewed exercises for accuracy.  Reviewed gait and stair training.  Pt remains overall supervision due to minor safety concerns.  Pt required cues to push from bed as she pulls onto RW.  Plan for follow up.      Follow Up Recommendations  Follow surgeon's recommendation for DC plan and follow-up therapies     Equipment Recommendations  None recommended by PT    Recommendations for Other Services       Precautions / Restrictions Precautions Precautions: Fall;Knee Precaution Comments: Pt and daughter were educated on resting in extension. NO pillow/roll/ice pack under knee, and use of towel roll or bone foam under heel.  Restrictions Weight Bearing Restrictions: Yes RLE Weight Bearing: Weight bearing as tolerated    Mobility  Bed Mobility Overal bed mobility: Modified Independent Bed Mobility: Supine to Sit     Supine to sit: Modified independent (Device/Increase time)        Transfers Overall transfer level: Needs assistance Equipment used: Rolling walker (2 wheeled) Transfers: Sit to/from Stand Sit to Stand: Supervision         General transfer comment: Cues for hand palcement and to keep RW close when back to a recliner chair.    Ambulation/Gait Ambulation/Gait assistance: Supervision Ambulation Distance (Feet): 350 Feet Assistive device: Rolling walker (2 wheeled) Gait Pattern/deviations: Step-through pattern;Decreased stride length;Trunk flexed     General Gait Details: Cues for upper trunk and head control.  Cues for symmetry with step through pattern.     Stairs Stairs: Yes Stairs assistance: Supervision Stair Management: One rail Left;Forwards;Step to  pattern Number of Stairs: 8 General stair comments: Able to recall sequencing, supervision provided for safety.     Wheelchair Mobility    Modified Rankin (Stroke Patients Only)       Balance Overall balance assessment: Needs assistance Sitting-balance support: Feet supported;No upper extremity supported Sitting balance-Leahy Scale: Fair       Standing balance-Leahy Scale: Fair                              Cognition Arousal/Alertness: Awake/alert Behavior During Therapy: WFL for tasks assessed/performed Overall Cognitive Status: Within Functional Limits for tasks assessed                                        Exercises Total Joint Exercises Ankle Circles/Pumps: AROM;Both;20 reps;Supine Quad Sets: AROM;Right;10 reps;Supine Towel Squeeze: AROM;Right;10 reps;Supine Short Arc Quad: AROM;Right;10 reps;Supine Heel Slides: AROM;Right;10 reps;Supine Hip ABduction/ADduction: AROM;Right;10 reps;Supine Straight Leg Raises: AROM;Right;10 reps;Supine Long Arc Quad: AROM;Right;10 reps;Seated Goniometric ROM: 80 degrees flexion in R knee.      General Comments        Pertinent Vitals/Pain Pain Assessment: 0-10 Pain Score: 6  Pain Location: R knee Pain Descriptors / Indicators: Operative site guarding;Discomfort Pain Intervention(s): Monitored during session;Repositioned    Home Living                      Prior Function  PT Goals (current goals can now be found in the care plan section) Acute Rehab PT Goals Patient Stated Goal: Home today Potential to Achieve Goals: Good Progress towards PT goals: Progressing toward goals    Frequency    7X/week      PT Plan Current plan remains appropriate    Co-evaluation              AM-PAC PT "6 Clicks" Daily Activity  Outcome Measure  Difficulty turning over in bed (including adjusting bedclothes, sheets and blankets)?: None Difficulty moving from lying on back  to sitting on the side of the bed? : None Difficulty sitting down on and standing up from a chair with arms (e.g., wheelchair, bedside commode, etc,.)?: A Little Help needed moving to and from a bed to chair (including a wheelchair)?: A Little Help needed walking in hospital room?: A Little Help needed climbing 3-5 steps with a railing? : A Little 6 Click Score: 20    End of Session Equipment Utilized During Treatment: Gait belt Activity Tolerance: Patient tolerated treatment well Patient left: in chair;with call bell/phone within reach;with family/visitor present Nurse Communication: Mobility status PT Visit Diagnosis: Unsteadiness on feet (R26.81);Pain;Other abnormalities of gait and mobility (R26.89) Pain - Right/Left: Right Pain - part of body: Knee     Time: 1130-1147 PT Time Calculation (min) (ACUTE ONLY): 17 min  Charges:  $Gait Training: 8-22 mins                    G Codes:       Governor Rooks, PTA pager [phone]    Cristela Blue 11/28/2017, 11:54 AM

## 2017-11-28 NOTE — Progress Notes (Signed)
Discharge instructions completed with pt and her daughter.  They verbalized understanding of the information.  Pt denies chest pain,shortness of breath, dizziness, lighteadedness, and n/v.  Pt discharged home.

## 2017-11-28 NOTE — Care Management Note (Signed)
Case Management Note  Patient Details  Name: Victoria [name] MRN: [id number] Date of Birth: 1956/02/17  Subjective/Objective:  62 yr old female s/p right total knee arthroplasty.                  Action/Plan: Patient was preoperatively setup with [facility] , no changes.      Expected Discharge Date:  11/27/17               Expected Discharge Plan:  [place]  In-House Referral:  NA  Discharge planning Services  CM Consult  Post Acute Care Choice:  Home Health(Has RW and 3in1) Choice offered to:  Patient  DME Arranged:  (has RW and 3in1) DME Agency:  [employer]  HH Arranged:  PT [place]:  [facility] (formerly [employer])  Status of Service:  Completed, signed off  If discussed at [employer], dates discussed:    Additional Comments:  Ninfa Meeker, RN 11/28/2017, 11:02 AM

## 2017-11-28 NOTE — Discharge Instructions (Signed)
Total Knee Replacement, Care After These instructions give you information about caring for yourself after your procedure. Your doctor may also give you more specific instructions. Call your doctor if you have any problems or questions after your procedure. Follow these instructions at home: Medicines  Take over-the-counter and prescription medicines only as told by your doctor.  If you were prescribed an antibiotic medicine, take it as told by your doctor. Do not stop taking the antibiotic even if you start to feel better.  If you were prescribed a blood thinner (anticoagulant), take it as told by your doctor. If you have a splint or brace:  Wear the splint or brace as told by your doctor. Remove it only as told by your doctor.  Loosen the splint or brace if your toes tingle, get numb, or turn cold and blue.  Do not let your splint or brace get wet if it is not waterproof.  Keep the splint or brace clean. Bathing   Do not take baths, swim, or use a hot tub until your doctor says it is okay. Ask your doctor if you can take showers. You may only be allowed to take sponge baths for bathing.  If you have a splint or brace that is not waterproof, cover it with a watertight covering when you take a bath or a shower.  Keep your bandage (dressing) dry until your doctor says it can be taken off. Incision care and drain care  Check your cut from surgery (incision) and your drain every day for signs of infection. Check for: ? More redness, swelling, or pain. ? More fluid or blood. ? Warmth. ? Pus or a bad smell.  Follow instructions from your doctor about how to take care of your cut from surgery. Make sure you: ? Wash your hands with soap and water before you change your bandage. If you cannot use soap and water, use hand sanitizer. ? Change your bandage as told by your doctor. ? Leave stitches (sutures), skin glue, or skin tape (adhesive) strips in place. They may need to stay in place  for 2 weeks or longer. If tape strips get loose and curl up, you may trim the loose edges. Do not remove tape strips completely unless your doctor says it is okay.  If you have a drain, follow instructions from your doctor about caring for it. Do not remove the drain tube or any bandages unless your doctor says it is okay. Managing pain, stiffness, and swelling   If directed, put ice on your knee. ? Put ice in a plastic bag. ? Place a towel between your skin and the bag. ? Leave the ice on for 20 minutes, 2-3 times per day.  If directed, apply heat to the affected area as often as told by your doctor. Use the heat source that your doctor recommends, such as a moist heat pack or a heating pad. ? Place a towel between your skin and the heat source. ? Leave the heat on for 20-30 minutes. ? Remove the heat if your skin turns bright red. This is especially important if you are unable to feel pain, heat, or cold. You may have a greater risk of getting burned.  Move your toes often to avoid stiffness and to lessen swelling.  Raise (elevate) your knee above the level of your heart while you are sitting or lying down.  Wear elastic knee support for as long as told by your doctor. Driving   Do not   drive until your doctor says it is okay. Ask your doctor when it is safe to drive if you have a splint or brace on your knee.  Do not drive or use heavy machinery while taking prescription pain medicine.  Do not drive for 24 hours if you received a sedative. Activity  Do not lift anything that is heavier than 10 lb (4.5 kg) until your doctor says it is okay.  Do not play contact sports until your doctor says it is okay.  Avoid high-impact activities, including running, jumping rope, and jumping jacks.  Avoid sitting for a long time without moving. Get up and move around at least every few hours.  If physical therapy was prescribed, do exercises as told by your doctor.  Return to your normal  activities as told by your doctor. Ask your doctor what activities are safe for you. Safety  Do not use your leg to support your body weight until your doctor says that you can. Use crutches or a walker as told by your doctor. General instructions   Do not have any dental work done for at least 3 months after your surgery. When you do have dental work done, tell your dentist about your joint replacement.  Do not use any tobacco products, such as cigarettes, chewing tobacco, or e-cigarettes. If you need help quitting, ask your doctor.  Wear special socks (compression stockings) as told by your doctor.  If you have been sent home with a knee joint motion machine (continuous passive motion machine), use it as told by your doctor.  Drink enough fluid to keep your pee (urine) clear or pale yellow.  If you have been told to lose weight, follow instructions from your doctor about how to do this safely.  Keep all follow-up visits as told by your doctor. This is important. Contact a doctor if:  You have more redness, swelling, or pain around your cut from surgery or your drain.  You have more fluid or blood coming from your cut from surgery or your drain.  Your cut from surgery or your drain area feels warm to the touch.  You have pus or a bad smell coming from your cut from surgery or your drain.  You have a fever.  Your cut breaks open after your doctor removes your stitches, skin glue, or skin tape strips.  Your new joint feels loose.  You have knee pain that does not go away. Get help right away if:  You have a rash.  You have pain in your calf or thigh.  You have swelling in your calf or thigh.  You have shortness of breath.  You have trouble breathing.  You have chest pain.  Your ability to move your knee is getting worse. This information is not intended to replace advice given to you by your health care provider. Make sure you discuss any questions you have with  your health care provider. Document Released: 09/04/2011 Document Revised: 02/14/2016 Document Reviewed: 05/19/2015 Elsevier Interactive Patient Education  2017 [employer] Inc[employer]  

## 2017-11-29 DIAGNOSIS — Z471 Aftercare following joint replacement surgery: Secondary | ICD-10-CM | POA: Diagnosis not present

## 2017-11-29 DIAGNOSIS — I10 Essential (primary) hypertension: Secondary | ICD-10-CM | POA: Diagnosis not present

## 2017-11-29 DIAGNOSIS — E1140 Type 2 diabetes mellitus with diabetic neuropathy, unspecified: Secondary | ICD-10-CM | POA: Diagnosis not present

## 2017-11-29 DIAGNOSIS — E039 Hypothyroidism, unspecified: Secondary | ICD-10-CM | POA: Diagnosis not present

## 2017-11-29 DIAGNOSIS — G5600 Carpal tunnel syndrome, unspecified upper limb: Secondary | ICD-10-CM | POA: Diagnosis not present

## 2017-11-29 DIAGNOSIS — M19049 Primary osteoarthritis, unspecified hand: Secondary | ICD-10-CM | POA: Diagnosis not present

## 2017-11-30 DIAGNOSIS — E1140 Type 2 diabetes mellitus with diabetic neuropathy, unspecified: Secondary | ICD-10-CM | POA: Diagnosis not present

## 2017-11-30 DIAGNOSIS — I10 Essential (primary) hypertension: Secondary | ICD-10-CM | POA: Diagnosis not present

## 2017-11-30 DIAGNOSIS — Z471 Aftercare following joint replacement surgery: Secondary | ICD-10-CM | POA: Diagnosis not present

## 2017-11-30 DIAGNOSIS — M19049 Primary osteoarthritis, unspecified hand: Secondary | ICD-10-CM | POA: Diagnosis not present

## 2017-11-30 DIAGNOSIS — G5600 Carpal tunnel syndrome, unspecified upper limb: Secondary | ICD-10-CM | POA: Diagnosis not present

## 2017-11-30 DIAGNOSIS — E039 Hypothyroidism, unspecified: Secondary | ICD-10-CM | POA: Diagnosis not present

## 2017-12-04 DIAGNOSIS — I10 Essential (primary) hypertension: Secondary | ICD-10-CM | POA: Diagnosis not present

## 2017-12-04 DIAGNOSIS — E1140 Type 2 diabetes mellitus with diabetic neuropathy, unspecified: Secondary | ICD-10-CM | POA: Diagnosis not present

## 2017-12-04 DIAGNOSIS — E039 Hypothyroidism, unspecified: Secondary | ICD-10-CM | POA: Diagnosis not present

## 2017-12-04 DIAGNOSIS — M19049 Primary osteoarthritis, unspecified hand: Secondary | ICD-10-CM | POA: Diagnosis not present

## 2017-12-04 DIAGNOSIS — G5600 Carpal tunnel syndrome, unspecified upper limb: Secondary | ICD-10-CM | POA: Diagnosis not present

## 2017-12-04 DIAGNOSIS — Z471 Aftercare following joint replacement surgery: Secondary | ICD-10-CM | POA: Diagnosis not present

## 2017-12-06 DIAGNOSIS — I10 Essential (primary) hypertension: Secondary | ICD-10-CM | POA: Diagnosis not present

## 2017-12-06 DIAGNOSIS — E039 Hypothyroidism, unspecified: Secondary | ICD-10-CM | POA: Diagnosis not present

## 2017-12-06 DIAGNOSIS — Z471 Aftercare following joint replacement surgery: Secondary | ICD-10-CM | POA: Diagnosis not present

## 2017-12-06 DIAGNOSIS — E1140 Type 2 diabetes mellitus with diabetic neuropathy, unspecified: Secondary | ICD-10-CM | POA: Diagnosis not present

## 2017-12-06 DIAGNOSIS — M19049 Primary osteoarthritis, unspecified hand: Secondary | ICD-10-CM | POA: Diagnosis not present

## 2017-12-06 DIAGNOSIS — G5600 Carpal tunnel syndrome, unspecified upper limb: Secondary | ICD-10-CM | POA: Diagnosis not present

## 2017-12-07 DIAGNOSIS — Z471 Aftercare following joint replacement surgery: Secondary | ICD-10-CM | POA: Diagnosis not present

## 2017-12-07 DIAGNOSIS — E1140 Type 2 diabetes mellitus with diabetic neuropathy, unspecified: Secondary | ICD-10-CM | POA: Diagnosis not present

## 2017-12-07 DIAGNOSIS — I10 Essential (primary) hypertension: Secondary | ICD-10-CM | POA: Diagnosis not present

## 2017-12-07 DIAGNOSIS — E039 Hypothyroidism, unspecified: Secondary | ICD-10-CM | POA: Diagnosis not present

## 2017-12-07 DIAGNOSIS — G5600 Carpal tunnel syndrome, unspecified upper limb: Secondary | ICD-10-CM | POA: Diagnosis not present

## 2017-12-07 DIAGNOSIS — M19049 Primary osteoarthritis, unspecified hand: Secondary | ICD-10-CM | POA: Diagnosis not present

## 2017-12-10 DIAGNOSIS — M6281 Muscle weakness (generalized): Secondary | ICD-10-CM | POA: Diagnosis not present

## 2017-12-10 DIAGNOSIS — M1711 Unilateral primary osteoarthritis, right knee: Secondary | ICD-10-CM | POA: Diagnosis not present

## 2017-12-10 DIAGNOSIS — M25561 Pain in right knee: Secondary | ICD-10-CM | POA: Diagnosis not present

## 2017-12-10 DIAGNOSIS — M25661 Stiffness of right knee, not elsewhere classified: Secondary | ICD-10-CM | POA: Diagnosis not present

## 2017-12-10 DIAGNOSIS — R262 Difficulty in walking, not elsewhere classified: Secondary | ICD-10-CM | POA: Diagnosis not present

## 2017-12-12 DIAGNOSIS — M6281 Muscle weakness (generalized): Secondary | ICD-10-CM | POA: Diagnosis not present

## 2017-12-12 DIAGNOSIS — R262 Difficulty in walking, not elsewhere classified: Secondary | ICD-10-CM | POA: Diagnosis not present

## 2017-12-12 DIAGNOSIS — M25661 Stiffness of right knee, not elsewhere classified: Secondary | ICD-10-CM | POA: Diagnosis not present

## 2017-12-12 DIAGNOSIS — M25561 Pain in right knee: Secondary | ICD-10-CM | POA: Diagnosis not present

## 2017-12-18 DIAGNOSIS — R262 Difficulty in walking, not elsewhere classified: Secondary | ICD-10-CM | POA: Diagnosis not present

## 2017-12-18 DIAGNOSIS — M6281 Muscle weakness (generalized): Secondary | ICD-10-CM | POA: Diagnosis not present

## 2017-12-18 DIAGNOSIS — M25561 Pain in right knee: Secondary | ICD-10-CM | POA: Diagnosis not present

## 2017-12-18 DIAGNOSIS — M25661 Stiffness of right knee, not elsewhere classified: Secondary | ICD-10-CM | POA: Diagnosis not present

## 2017-12-20 DIAGNOSIS — R262 Difficulty in walking, not elsewhere classified: Secondary | ICD-10-CM | POA: Diagnosis not present

## 2017-12-20 DIAGNOSIS — M6281 Muscle weakness (generalized): Secondary | ICD-10-CM | POA: Diagnosis not present

## 2017-12-20 DIAGNOSIS — M25661 Stiffness of right knee, not elsewhere classified: Secondary | ICD-10-CM | POA: Diagnosis not present

## 2017-12-20 DIAGNOSIS — M25561 Pain in right knee: Secondary | ICD-10-CM | POA: Diagnosis not present

## 2017-12-25 DIAGNOSIS — M25661 Stiffness of right knee, not elsewhere classified: Secondary | ICD-10-CM | POA: Diagnosis not present

## 2017-12-25 DIAGNOSIS — M25561 Pain in right knee: Secondary | ICD-10-CM | POA: Diagnosis not present

## 2017-12-25 DIAGNOSIS — M6281 Muscle weakness (generalized): Secondary | ICD-10-CM | POA: Diagnosis not present

## 2017-12-25 DIAGNOSIS — R262 Difficulty in walking, not elsewhere classified: Secondary | ICD-10-CM | POA: Diagnosis not present

## 2017-12-28 DIAGNOSIS — M25561 Pain in right knee: Secondary | ICD-10-CM | POA: Diagnosis not present

## 2017-12-28 DIAGNOSIS — R262 Difficulty in walking, not elsewhere classified: Secondary | ICD-10-CM | POA: Diagnosis not present

## 2017-12-28 DIAGNOSIS — M25661 Stiffness of right knee, not elsewhere classified: Secondary | ICD-10-CM | POA: Diagnosis not present

## 2017-12-28 DIAGNOSIS — M6281 Muscle weakness (generalized): Secondary | ICD-10-CM | POA: Diagnosis not present

## 2018-01-01 DIAGNOSIS — R262 Difficulty in walking, not elsewhere classified: Secondary | ICD-10-CM | POA: Diagnosis not present

## 2018-01-01 DIAGNOSIS — M25661 Stiffness of right knee, not elsewhere classified: Secondary | ICD-10-CM | POA: Diagnosis not present

## 2018-01-01 DIAGNOSIS — M6281 Muscle weakness (generalized): Secondary | ICD-10-CM | POA: Diagnosis not present

## 2018-01-01 DIAGNOSIS — M25561 Pain in right knee: Secondary | ICD-10-CM | POA: Diagnosis not present

## 2018-01-03 DIAGNOSIS — M6281 Muscle weakness (generalized): Secondary | ICD-10-CM | POA: Diagnosis not present

## 2018-01-03 DIAGNOSIS — R262 Difficulty in walking, not elsewhere classified: Secondary | ICD-10-CM | POA: Diagnosis not present

## 2018-01-03 DIAGNOSIS — M25561 Pain in right knee: Secondary | ICD-10-CM | POA: Diagnosis not present

## 2018-01-03 DIAGNOSIS — M25661 Stiffness of right knee, not elsewhere classified: Secondary | ICD-10-CM | POA: Diagnosis not present

## 2018-01-07 DIAGNOSIS — M25561 Pain in right knee: Secondary | ICD-10-CM | POA: Diagnosis not present

## 2018-01-08 DIAGNOSIS — M25661 Stiffness of right knee, not elsewhere classified: Secondary | ICD-10-CM | POA: Diagnosis not present

## 2018-01-08 DIAGNOSIS — M6281 Muscle weakness (generalized): Secondary | ICD-10-CM | POA: Diagnosis not present

## 2018-01-08 DIAGNOSIS — R262 Difficulty in walking, not elsewhere classified: Secondary | ICD-10-CM | POA: Diagnosis not present

## 2018-01-08 DIAGNOSIS — M25561 Pain in right knee: Secondary | ICD-10-CM | POA: Diagnosis not present

## 2018-01-11 DIAGNOSIS — M25661 Stiffness of right knee, not elsewhere classified: Secondary | ICD-10-CM | POA: Diagnosis not present

## 2018-01-11 DIAGNOSIS — R262 Difficulty in walking, not elsewhere classified: Secondary | ICD-10-CM | POA: Diagnosis not present

## 2018-01-11 DIAGNOSIS — M6281 Muscle weakness (generalized): Secondary | ICD-10-CM | POA: Diagnosis not present

## 2018-01-11 DIAGNOSIS — M1711 Unilateral primary osteoarthritis, right knee: Secondary | ICD-10-CM | POA: Diagnosis not present

## 2018-01-15 DIAGNOSIS — M1711 Unilateral primary osteoarthritis, right knee: Secondary | ICD-10-CM | POA: Diagnosis not present

## 2018-01-15 DIAGNOSIS — R262 Difficulty in walking, not elsewhere classified: Secondary | ICD-10-CM | POA: Diagnosis not present

## 2018-01-15 DIAGNOSIS — M6281 Muscle weakness (generalized): Secondary | ICD-10-CM | POA: Diagnosis not present

## 2018-01-15 DIAGNOSIS — Z96651 Presence of right artificial knee joint: Secondary | ICD-10-CM | POA: Diagnosis not present

## 2018-01-17 DIAGNOSIS — M25561 Pain in right knee: Secondary | ICD-10-CM | POA: Diagnosis not present

## 2018-01-17 DIAGNOSIS — M25661 Stiffness of right knee, not elsewhere classified: Secondary | ICD-10-CM | POA: Diagnosis not present

## 2018-01-17 DIAGNOSIS — R262 Difficulty in walking, not elsewhere classified: Secondary | ICD-10-CM | POA: Diagnosis not present

## 2018-01-17 DIAGNOSIS — M6281 Muscle weakness (generalized): Secondary | ICD-10-CM | POA: Diagnosis not present

## 2018-01-22 DIAGNOSIS — M25661 Stiffness of right knee, not elsewhere classified: Secondary | ICD-10-CM | POA: Diagnosis not present

## 2018-01-22 DIAGNOSIS — M25561 Pain in right knee: Secondary | ICD-10-CM | POA: Diagnosis not present

## 2018-01-22 DIAGNOSIS — M6281 Muscle weakness (generalized): Secondary | ICD-10-CM | POA: Diagnosis not present

## 2018-01-22 DIAGNOSIS — R262 Difficulty in walking, not elsewhere classified: Secondary | ICD-10-CM | POA: Diagnosis not present

## 2018-01-24 DIAGNOSIS — M25561 Pain in right knee: Secondary | ICD-10-CM | POA: Diagnosis not present

## 2018-01-24 DIAGNOSIS — M25661 Stiffness of right knee, not elsewhere classified: Secondary | ICD-10-CM | POA: Diagnosis not present

## 2018-01-24 DIAGNOSIS — R262 Difficulty in walking, not elsewhere classified: Secondary | ICD-10-CM | POA: Diagnosis not present

## 2018-01-24 DIAGNOSIS — M6281 Muscle weakness (generalized): Secondary | ICD-10-CM | POA: Diagnosis not present

## 2018-01-29 DIAGNOSIS — R262 Difficulty in walking, not elsewhere classified: Secondary | ICD-10-CM | POA: Diagnosis not present

## 2018-01-29 DIAGNOSIS — M6281 Muscle weakness (generalized): Secondary | ICD-10-CM | POA: Diagnosis not present

## 2018-01-29 DIAGNOSIS — M25561 Pain in right knee: Secondary | ICD-10-CM | POA: Diagnosis not present

## 2018-01-29 DIAGNOSIS — M25661 Stiffness of right knee, not elsewhere classified: Secondary | ICD-10-CM | POA: Diagnosis not present

## 2018-01-30 DIAGNOSIS — M25561 Pain in right knee: Secondary | ICD-10-CM | POA: Diagnosis not present

## 2018-01-30 DIAGNOSIS — M25661 Stiffness of right knee, not elsewhere classified: Secondary | ICD-10-CM | POA: Diagnosis not present

## 2018-01-30 DIAGNOSIS — R262 Difficulty in walking, not elsewhere classified: Secondary | ICD-10-CM | POA: Diagnosis not present

## 2018-01-30 DIAGNOSIS — M6281 Muscle weakness (generalized): Secondary | ICD-10-CM | POA: Diagnosis not present

## 2018-02-04 DIAGNOSIS — M25561 Pain in right knee: Secondary | ICD-10-CM | POA: Diagnosis not present

## 2018-02-11 DIAGNOSIS — Z96651 Presence of right artificial knee joint: Secondary | ICD-10-CM | POA: Diagnosis not present

## 2018-02-11 DIAGNOSIS — M1711 Unilateral primary osteoarthritis, right knee: Secondary | ICD-10-CM | POA: Diagnosis not present

## 2018-02-11 DIAGNOSIS — M24661 Ankylosis, right knee: Secondary | ICD-10-CM | POA: Diagnosis not present

## 2018-02-11 DIAGNOSIS — G8918 Other acute postprocedural pain: Secondary | ICD-10-CM | POA: Diagnosis not present

## 2018-02-11 DIAGNOSIS — M25561 Pain in right knee: Secondary | ICD-10-CM | POA: Diagnosis not present

## 2018-02-12 DIAGNOSIS — M6281 Muscle weakness (generalized): Secondary | ICD-10-CM | POA: Diagnosis not present

## 2018-02-12 DIAGNOSIS — M25561 Pain in right knee: Secondary | ICD-10-CM | POA: Diagnosis not present

## 2018-02-12 DIAGNOSIS — R262 Difficulty in walking, not elsewhere classified: Secondary | ICD-10-CM | POA: Diagnosis not present

## 2018-02-12 DIAGNOSIS — M25661 Stiffness of right knee, not elsewhere classified: Secondary | ICD-10-CM | POA: Diagnosis not present

## 2018-02-13 DIAGNOSIS — M6281 Muscle weakness (generalized): Secondary | ICD-10-CM | POA: Diagnosis not present

## 2018-02-13 DIAGNOSIS — M25661 Stiffness of right knee, not elsewhere classified: Secondary | ICD-10-CM | POA: Diagnosis not present

## 2018-02-13 DIAGNOSIS — R262 Difficulty in walking, not elsewhere classified: Secondary | ICD-10-CM | POA: Diagnosis not present

## 2018-02-13 DIAGNOSIS — M25561 Pain in right knee: Secondary | ICD-10-CM | POA: Diagnosis not present

## 2018-02-14 DIAGNOSIS — M25561 Pain in right knee: Secondary | ICD-10-CM | POA: Diagnosis not present

## 2018-02-14 DIAGNOSIS — M6281 Muscle weakness (generalized): Secondary | ICD-10-CM | POA: Diagnosis not present

## 2018-02-14 DIAGNOSIS — M25661 Stiffness of right knee, not elsewhere classified: Secondary | ICD-10-CM | POA: Diagnosis not present

## 2018-02-14 DIAGNOSIS — R262 Difficulty in walking, not elsewhere classified: Secondary | ICD-10-CM | POA: Diagnosis not present

## 2018-02-15 DIAGNOSIS — M6281 Muscle weakness (generalized): Secondary | ICD-10-CM | POA: Diagnosis not present

## 2018-02-15 DIAGNOSIS — M25561 Pain in right knee: Secondary | ICD-10-CM | POA: Diagnosis not present

## 2018-02-15 DIAGNOSIS — R262 Difficulty in walking, not elsewhere classified: Secondary | ICD-10-CM | POA: Diagnosis not present

## 2018-02-15 DIAGNOSIS — M25661 Stiffness of right knee, not elsewhere classified: Secondary | ICD-10-CM | POA: Diagnosis not present

## 2018-02-18 DIAGNOSIS — E538 Deficiency of other specified B group vitamins: Secondary | ICD-10-CM | POA: Diagnosis not present

## 2018-02-18 DIAGNOSIS — M6281 Muscle weakness (generalized): Secondary | ICD-10-CM | POA: Diagnosis not present

## 2018-02-18 DIAGNOSIS — Z794 Long term (current) use of insulin: Secondary | ICD-10-CM | POA: Diagnosis not present

## 2018-02-18 DIAGNOSIS — Z0000 Encounter for general adult medical examination without abnormal findings: Secondary | ICD-10-CM | POA: Diagnosis not present

## 2018-02-18 DIAGNOSIS — E559 Vitamin D deficiency, unspecified: Secondary | ICD-10-CM | POA: Diagnosis not present

## 2018-02-18 DIAGNOSIS — M1712 Unilateral primary osteoarthritis, left knee: Secondary | ICD-10-CM | POA: Diagnosis not present

## 2018-02-18 DIAGNOSIS — E785 Hyperlipidemia, unspecified: Secondary | ICD-10-CM | POA: Diagnosis not present

## 2018-02-18 DIAGNOSIS — E039 Hypothyroidism, unspecified: Secondary | ICD-10-CM | POA: Diagnosis not present

## 2018-02-18 DIAGNOSIS — M25661 Stiffness of right knee, not elsewhere classified: Secondary | ICD-10-CM | POA: Diagnosis not present

## 2018-02-18 DIAGNOSIS — E1169 Type 2 diabetes mellitus with other specified complication: Secondary | ICD-10-CM | POA: Diagnosis not present

## 2018-02-18 DIAGNOSIS — M25561 Pain in right knee: Secondary | ICD-10-CM | POA: Diagnosis not present

## 2018-02-18 DIAGNOSIS — R262 Difficulty in walking, not elsewhere classified: Secondary | ICD-10-CM | POA: Diagnosis not present

## 2018-02-18 DIAGNOSIS — I10 Essential (primary) hypertension: Secondary | ICD-10-CM | POA: Diagnosis not present

## 2018-02-19 DIAGNOSIS — M25561 Pain in right knee: Secondary | ICD-10-CM | POA: Diagnosis not present

## 2018-02-20 DIAGNOSIS — M6281 Muscle weakness (generalized): Secondary | ICD-10-CM | POA: Diagnosis not present

## 2018-02-20 DIAGNOSIS — M25561 Pain in right knee: Secondary | ICD-10-CM | POA: Diagnosis not present

## 2018-02-20 DIAGNOSIS — R262 Difficulty in walking, not elsewhere classified: Secondary | ICD-10-CM | POA: Diagnosis not present

## 2018-02-20 DIAGNOSIS — M25661 Stiffness of right knee, not elsewhere classified: Secondary | ICD-10-CM | POA: Diagnosis not present

## 2018-02-26 DIAGNOSIS — R262 Difficulty in walking, not elsewhere classified: Secondary | ICD-10-CM | POA: Diagnosis not present

## 2018-02-26 DIAGNOSIS — M25561 Pain in right knee: Secondary | ICD-10-CM | POA: Diagnosis not present

## 2018-02-26 DIAGNOSIS — M25661 Stiffness of right knee, not elsewhere classified: Secondary | ICD-10-CM | POA: Diagnosis not present

## 2018-02-26 DIAGNOSIS — M6281 Muscle weakness (generalized): Secondary | ICD-10-CM | POA: Diagnosis not present

## 2018-02-28 DIAGNOSIS — M25661 Stiffness of right knee, not elsewhere classified: Secondary | ICD-10-CM | POA: Diagnosis not present

## 2018-02-28 DIAGNOSIS — M6281 Muscle weakness (generalized): Secondary | ICD-10-CM | POA: Diagnosis not present

## 2018-02-28 DIAGNOSIS — M25561 Pain in right knee: Secondary | ICD-10-CM | POA: Diagnosis not present

## 2018-02-28 DIAGNOSIS — R262 Difficulty in walking, not elsewhere classified: Secondary | ICD-10-CM | POA: Diagnosis not present

## 2018-03-04 DIAGNOSIS — M6281 Muscle weakness (generalized): Secondary | ICD-10-CM | POA: Diagnosis not present

## 2018-03-04 DIAGNOSIS — R262 Difficulty in walking, not elsewhere classified: Secondary | ICD-10-CM | POA: Diagnosis not present

## 2018-03-04 DIAGNOSIS — M25661 Stiffness of right knee, not elsewhere classified: Secondary | ICD-10-CM | POA: Diagnosis not present

## 2018-03-04 DIAGNOSIS — M25561 Pain in right knee: Secondary | ICD-10-CM | POA: Diagnosis not present

## 2018-03-06 DIAGNOSIS — M25661 Stiffness of right knee, not elsewhere classified: Secondary | ICD-10-CM | POA: Diagnosis not present

## 2018-03-06 DIAGNOSIS — M25561 Pain in right knee: Secondary | ICD-10-CM | POA: Diagnosis not present

## 2018-03-06 DIAGNOSIS — R262 Difficulty in walking, not elsewhere classified: Secondary | ICD-10-CM | POA: Diagnosis not present

## 2018-03-06 DIAGNOSIS — M6281 Muscle weakness (generalized): Secondary | ICD-10-CM | POA: Diagnosis not present

## 2018-03-07 ENCOUNTER — Other Ambulatory Visit: Payer: Self-pay | Admitting: Family Medicine

## 2018-03-07 DIAGNOSIS — Z1231 Encounter for screening mammogram for malignant neoplasm of breast: Secondary | ICD-10-CM

## 2018-03-11 DIAGNOSIS — M25661 Stiffness of right knee, not elsewhere classified: Secondary | ICD-10-CM | POA: Diagnosis not present

## 2018-03-11 DIAGNOSIS — M6281 Muscle weakness (generalized): Secondary | ICD-10-CM | POA: Diagnosis not present

## 2018-03-11 DIAGNOSIS — R262 Difficulty in walking, not elsewhere classified: Secondary | ICD-10-CM | POA: Diagnosis not present

## 2018-03-11 DIAGNOSIS — M25561 Pain in right knee: Secondary | ICD-10-CM | POA: Diagnosis not present

## 2018-03-14 DIAGNOSIS — M6281 Muscle weakness (generalized): Secondary | ICD-10-CM | POA: Diagnosis not present

## 2018-03-14 DIAGNOSIS — M25661 Stiffness of right knee, not elsewhere classified: Secondary | ICD-10-CM | POA: Diagnosis not present

## 2018-03-14 DIAGNOSIS — M25561 Pain in right knee: Secondary | ICD-10-CM | POA: Diagnosis not present

## 2018-03-14 DIAGNOSIS — R262 Difficulty in walking, not elsewhere classified: Secondary | ICD-10-CM | POA: Diagnosis not present

## 2018-03-19 DIAGNOSIS — Z96641 Presence of right artificial hip joint: Secondary | ICD-10-CM | POA: Diagnosis not present

## 2018-04-04 DIAGNOSIS — Z794 Long term (current) use of insulin: Secondary | ICD-10-CM | POA: Diagnosis not present

## 2018-04-04 DIAGNOSIS — Z23 Encounter for immunization: Secondary | ICD-10-CM | POA: Diagnosis not present

## 2018-04-04 DIAGNOSIS — E139 Other specified diabetes mellitus without complications: Secondary | ICD-10-CM | POA: Diagnosis not present

## 2018-04-04 DIAGNOSIS — E063 Autoimmune thyroiditis: Secondary | ICD-10-CM | POA: Diagnosis not present

## 2018-04-04 DIAGNOSIS — E039 Hypothyroidism, unspecified: Secondary | ICD-10-CM | POA: Diagnosis not present

## 2018-04-08 DIAGNOSIS — E039 Hypothyroidism, unspecified: Secondary | ICD-10-CM | POA: Diagnosis not present

## 2018-04-08 DIAGNOSIS — E139 Other specified diabetes mellitus without complications: Secondary | ICD-10-CM | POA: Diagnosis not present

## 2018-04-15 ENCOUNTER — Ambulatory Visit
Admission: RE | Admit: 2018-04-15 | Discharge: 2018-04-15 | Disposition: A | Discharge status: Home / Self Care | Payer: Medicare HMO | Source: Ambulatory Visit | Attending: Family Medicine | Admitting: Family Medicine

## 2018-04-15 DIAGNOSIS — Z1231 Encounter for screening mammogram for malignant neoplasm of breast: Secondary | ICD-10-CM

## 2018-06-10 DIAGNOSIS — M1712 Unilateral primary osteoarthritis, left knee: Secondary | ICD-10-CM | POA: Diagnosis not present

## 2018-06-10 DIAGNOSIS — Z96651 Presence of right artificial knee joint: Secondary | ICD-10-CM | POA: Diagnosis not present

## 2018-08-21 DIAGNOSIS — M779 Enthesopathy, unspecified: Secondary | ICD-10-CM | POA: Diagnosis not present

## 2018-08-21 DIAGNOSIS — D72829 Elevated white blood cell count, unspecified: Secondary | ICD-10-CM | POA: Diagnosis not present

## 2018-08-21 DIAGNOSIS — E785 Hyperlipidemia, unspecified: Secondary | ICD-10-CM | POA: Diagnosis not present

## 2018-08-21 DIAGNOSIS — M1712 Unilateral primary osteoarthritis, left knee: Secondary | ICD-10-CM | POA: Diagnosis not present

## 2018-08-21 DIAGNOSIS — E063 Autoimmune thyroiditis: Secondary | ICD-10-CM | POA: Diagnosis not present

## 2018-08-21 DIAGNOSIS — I10 Essential (primary) hypertension: Secondary | ICD-10-CM | POA: Diagnosis not present

## 2018-12-12 DIAGNOSIS — E119 Type 2 diabetes mellitus without complications: Secondary | ICD-10-CM | POA: Diagnosis not present

## 2018-12-12 DIAGNOSIS — H35033 Hypertensive retinopathy, bilateral: Secondary | ICD-10-CM | POA: Diagnosis not present

## 2018-12-12 DIAGNOSIS — H11151 Pinguecula, right eye: Secondary | ICD-10-CM | POA: Diagnosis not present

## 2018-12-12 DIAGNOSIS — H04123 Dry eye syndrome of bilateral lacrimal glands: Secondary | ICD-10-CM | POA: Diagnosis not present

## 2019-01-16 DIAGNOSIS — E139 Other specified diabetes mellitus without complications: Secondary | ICD-10-CM | POA: Diagnosis not present

## 2019-01-16 DIAGNOSIS — E063 Autoimmune thyroiditis: Secondary | ICD-10-CM | POA: Diagnosis not present

## 2019-01-16 DIAGNOSIS — E039 Hypothyroidism, unspecified: Secondary | ICD-10-CM | POA: Diagnosis not present

## 2019-01-16 DIAGNOSIS — Z794 Long term (current) use of insulin: Secondary | ICD-10-CM | POA: Diagnosis not present

## 2019-03-06 DIAGNOSIS — Z23 Encounter for immunization: Secondary | ICD-10-CM | POA: Diagnosis not present

## 2019-04-03 ENCOUNTER — Other Ambulatory Visit: Payer: Self-pay | Admitting: Family Medicine

## 2019-04-03 DIAGNOSIS — Z1231 Encounter for screening mammogram for malignant neoplasm of breast: Secondary | ICD-10-CM

## 2019-04-28 DIAGNOSIS — E139 Other specified diabetes mellitus without complications: Secondary | ICD-10-CM | POA: Diagnosis not present

## 2019-04-28 DIAGNOSIS — Z794 Long term (current) use of insulin: Secondary | ICD-10-CM | POA: Diagnosis not present

## 2019-04-28 DIAGNOSIS — E1165 Type 2 diabetes mellitus with hyperglycemia: Secondary | ICD-10-CM | POA: Diagnosis not present

## 2019-04-28 DIAGNOSIS — E063 Autoimmune thyroiditis: Secondary | ICD-10-CM | POA: Diagnosis not present

## 2019-04-28 DIAGNOSIS — E039 Hypothyroidism, unspecified: Secondary | ICD-10-CM | POA: Diagnosis not present

## 2019-05-19 ENCOUNTER — Other Ambulatory Visit: Payer: Self-pay

## 2019-05-19 ENCOUNTER — Ambulatory Visit
Admission: RE | Admit: 2019-05-19 | Discharge: 2019-05-19 | Disposition: A | Discharge status: Home / Self Care | Payer: Medicare HMO | Source: Ambulatory Visit | Attending: Family Medicine | Admitting: Family Medicine

## 2019-05-19 DIAGNOSIS — Z1231 Encounter for screening mammogram for malignant neoplasm of breast: Secondary | ICD-10-CM

## 2019-06-09 DIAGNOSIS — Z23 Encounter for immunization: Secondary | ICD-10-CM | POA: Diagnosis not present

## 2019-06-09 DIAGNOSIS — Z1159 Encounter for screening for other viral diseases: Secondary | ICD-10-CM | POA: Diagnosis not present

## 2019-06-09 DIAGNOSIS — Z124 Encounter for screening for malignant neoplasm of cervix: Secondary | ICD-10-CM | POA: Diagnosis not present

## 2019-06-09 DIAGNOSIS — E559 Vitamin D deficiency, unspecified: Secondary | ICD-10-CM | POA: Diagnosis not present

## 2019-06-09 DIAGNOSIS — E785 Hyperlipidemia, unspecified: Secondary | ICD-10-CM | POA: Diagnosis not present

## 2019-06-09 DIAGNOSIS — I10 Essential (primary) hypertension: Secondary | ICD-10-CM | POA: Diagnosis not present

## 2019-06-09 DIAGNOSIS — E538 Deficiency of other specified B group vitamins: Secondary | ICD-10-CM | POA: Diagnosis not present

## 2019-06-09 DIAGNOSIS — E039 Hypothyroidism, unspecified: Secondary | ICD-10-CM | POA: Diagnosis not present

## 2019-06-09 DIAGNOSIS — Z0000 Encounter for general adult medical examination without abnormal findings: Secondary | ICD-10-CM | POA: Diagnosis not present

## 2019-06-09 DIAGNOSIS — M5136 Other intervertebral disc degeneration, lumbar region: Secondary | ICD-10-CM | POA: Diagnosis not present

## 2019-09-04 DIAGNOSIS — Z96651 Presence of right artificial knee joint: Secondary | ICD-10-CM | POA: Diagnosis not present

## 2019-09-04 DIAGNOSIS — M1712 Unilateral primary osteoarthritis, left knee: Secondary | ICD-10-CM | POA: Diagnosis not present

## 2019-09-29 DIAGNOSIS — H35033 Hypertensive retinopathy, bilateral: Secondary | ICD-10-CM | POA: Diagnosis not present

## 2019-09-29 DIAGNOSIS — H04123 Dry eye syndrome of bilateral lacrimal glands: Secondary | ICD-10-CM | POA: Diagnosis not present

## 2019-09-29 DIAGNOSIS — E119 Type 2 diabetes mellitus without complications: Secondary | ICD-10-CM | POA: Diagnosis not present

## 2019-09-29 DIAGNOSIS — H11151 Pinguecula, right eye: Secondary | ICD-10-CM | POA: Diagnosis not present

## 2019-10-14 DIAGNOSIS — I10 Essential (primary) hypertension: Secondary | ICD-10-CM | POA: Diagnosis not present

## 2019-10-14 DIAGNOSIS — E039 Hypothyroidism, unspecified: Secondary | ICD-10-CM | POA: Diagnosis not present

## 2019-10-14 DIAGNOSIS — Z794 Long term (current) use of insulin: Secondary | ICD-10-CM | POA: Diagnosis not present

## 2019-10-14 DIAGNOSIS — E063 Autoimmune thyroiditis: Secondary | ICD-10-CM | POA: Diagnosis not present

## 2019-10-14 DIAGNOSIS — E139 Other specified diabetes mellitus without complications: Secondary | ICD-10-CM | POA: Diagnosis not present

## 2019-10-21 ENCOUNTER — Encounter (HOSPITAL_COMMUNITY): Payer: Self-pay | Admitting: Orthopedic Surgery

## 2019-10-21 DIAGNOSIS — M1712 Unilateral primary osteoarthritis, left knee: Secondary | ICD-10-CM | POA: Diagnosis not present

## 2019-10-21 DIAGNOSIS — Z96652 Presence of left artificial knee joint: Secondary | ICD-10-CM

## 2019-10-21 DIAGNOSIS — Z96651 Presence of right artificial knee joint: Secondary | ICD-10-CM

## 2019-10-21 HISTORY — DX: Unilateral primary osteoarthritis, left knee: M17.12

## 2019-10-21 HISTORY — DX: Presence of left artificial knee joint: Z96.652

## 2019-10-21 NOTE — H&P (Addendum)
TOTAL KNEE ADMISSION H&P  Patient is being admitted for left total knee arthroplasty.  Subjective:  Chief Complaint:left knee pain.  HPI: Victoria [name], 64 y.o. female, has a history of pain and functional disability in the left knee due to arthritis and has failed non-surgical conservative treatments for greater than 12 weeks to includeNSAID's and/or analgesics, corticosteriod injections, viscosupplementation injections, flexibility and strengthening excercises, supervised PT with diminished ADL's post treatment, use of assistive devices, weight reduction as appropriate and activity modification.  Onset of symptoms was gradual, starting 10 years ago with gradually worsening course since that time. The patient noted prior procedures on the knee to include  arthroscopy on the left knee(s).  Patient currently rates pain in the left knee(s) at 10 out of 10 with activity. Patient has night pain, worsening of pain with activity and weight bearing, pain that interferes with activities of daily living, crepitus and joint swelling.  Patient has evidence of subchondral sclerosis, periarticular osteophytes and joint space narrowing by imaging studies.There is no active infection.  Patient Active Problem List   Diagnosis Date Noted  . S/P total knee arthroplasty, right 10/21/2019  . Primary localized osteoarthritis of left knee 10/21/2019  . Primary localized osteoarthritis of right knee 11/14/2017  . DOE (dyspnea on exertion) 09/21/2016  . Diabetic hyperosmolar non-ketotic state ([place]) 09/28/2013  . [place] arthritis 04/02/2012  . Prediabetes 02/10/2011  . ARTHRITIS, HAND 02/08/2010  . OTHER MALAISE AND FATIGUE 01/26/2010  . NEUROPATHY 12/02/2008  . OVARIAN CYST, LEFT 09/16/2007  . Hypothyroidism 11/05/2006  . VITAMIN B12 DEFICIENCY 11/05/2006  . OBESITY 11/05/2006  . CARPAL TUNNEL SYNDROME, BILATERAL 11/05/2006  . Benign essential HTN 11/05/2006  . ARTHRITIS, KNEE 11/05/2006   Past Medical History:   Diagnosis Date  . Benign essential HTN   . Carpal tunnel syndrome   . Diabetes mellitus without complication ([place])   . Dyslipidemia   . Hypothyroidism   . Jaundice, unspecified, not of newborn    Scleral icterus since age 48  . Obesity, unspecified   . Other B-complex deficiencies   . Primary localized osteoarthritis of left knee 10/21/2019  . Primary localized osteoarthritis of right knee 11/14/2017  . S/P TKR (total knee replacement), left 10/21/2019  . Unspecified monoarthritis, forearm     Past Surgical History:  Procedure Laterality Date  . CARDIAC CATHETERIZATION    . CHOLECYSTECTOMY    . TEAR DUCT PROBING    . TOTAL KNEE ARTHROPLASTY Right 11/26/2017  . TOTAL KNEE ARTHROPLASTY Right 11/26/2017   Procedure: RIGHT TOTAL KNEE ARTHROPLASTY;  Surgeon: Elsie Saas, MD;  Location: [facility];  Service: Orthopedics;  Laterality: Right;  . WISDOM TOOTH EXTRACTION      No current facility-administered medications for this encounter.   Current Outpatient Medications  Medication Sig Dispense Refill Last Dose  . aspirin EC 81 MG tablet Take 81 mg by mouth daily.     Marland Kitchen aspirin-acetaminophen-caffeine (EXCEDRIN MIGRAINE) 250-250-65 MG tablet Take 1 tablet by mouth 2 (two) times daily as needed for headache.     . Calcium Carb-Cholecalciferol (CALCIUM 600+D3) 600-800 MG-UNIT TABS Take 1 tablet by mouth every evening.      . ferrous sulfate 325 (65 FE) MG tablet Take 325 mg by mouth every evening.     . insulin NPH Human (HUMULIN N,NOVOLIN N) 100 UNIT/ML injection Inject 12 Units into the skin 4 (four) times daily.      . insulin regular (NOVOLIN R,HUMULIN R) 100 units/mL injection Inject 14-23 Units into the  skin 4 (four) times daily - after meals and at bedtime. Sliding Scale     . levothyroxine (SYNTHROID, LEVOTHROID) 100 MCG tablet Take 100 mcg by mouth every other day. Alternate with 112     . levothyroxine (SYNTHROID, LEVOTHROID) 112 MCG tablet TAKE 1 TABLET BY MOUTH EVERY OTHER DAY  (Patient taking differently: Take 112 mcg by mouth daily before breakfast. Alternate with 100 mcg) 30 tablet 5   . lisinopril-hydrochlorothiazide (ZESTORETIC) 10-12.5 MG tablet Take 1 tablet by mouth daily.     . meloxicam (MOBIC) 15 MG tablet DO NOT TAKE UNTIL 2 WEEKS AFTER SURGERY (Patient taking differently: Take 15 mg by mouth daily. ) 1 tablet 0   . metoprolol succinate (TOPROL-XL) 25 MG 24 hr tablet TAKE 1 TABLET BY MOUTH EVERY DAY (Patient taking differently: Take 25 mg by mouth daily. ) 90 tablet 0   . simvastatin (ZOCOR) 20 MG tablet Take 20 mg by mouth every evening.      Marland Kitchen tiZANidine (ZANAFLEX) 2 MG tablet Take 2 mg by mouth at bedtime as needed for muscle spasms.      . vitamin B-12 (CYANOCOBALAMIN) 500 MCG tablet Take 500 mcg by mouth every evening.     Marland Kitchen aspirin EC 325 MG EC tablet 1 tab a day for the next 30 days to prevent blood clots (Patient not taking: Reported on 10/14/2019) 30 tablet 0 Not Taking at Unknown time  . metoCLOPramide (REGLAN) 5 MG tablet Take 3 tablets (15 mg total) by mouth every 8 (eight) hours as needed for nausea. (Patient not taking: Reported on 10/14/2019) 15 tablet 0 Not Taking at Unknown time   No Known Allergies  Social History   Tobacco Use  . Smoking status: Never Smoker  . Smokeless tobacco: Never Used  Substance Use Topics  . Alcohol use: No    Family History  Problem Relation Age of Onset  . Diabetes Mother   . Hypertension Mother   . Hypertension Father   . Breast cancer Neg Hx      Review of Systems  Constitutional: Negative.   Eyes: Negative.   Respiratory: Negative.   Cardiovascular: Negative.   Endocrine: Negative.   Genitourinary: Negative.   Musculoskeletal: Positive for back pain, gait problem, joint swelling and myalgias.  Allergic/Immunologic: Negative.   Hematological: Negative.   Psychiatric/Behavioral: Negative.     Objective:  Physical Exam  Constitutional: She is oriented to person, place, and time. She appears  well-developed and well-nourished.  HENT:  Head: Atraumatic.  Eyes: Pupils are equal, round, and reactive to light. Conjunctivae are normal.  Cardiovascular: Normal rate.  Respiratory: Effort normal.  GI: Soft.  Genitourinary:    Genitourinary Comments: Not pertinent to current symptomatology therefore not examined.   Musculoskeletal:     Cervical back: Neck supple.     Comments: Examination of her left knee reveals pain medially and laterally.  Mild varus deformity.  1+ crepitation.  1+ synovitis.  Range of motion 0-120 degrees.  Knee is stable with normal patella tracking.  Examination of her right knee reveals well healed total knee incision without swelling or pain.  Range of motion 0-120 degrees.  Knee is stable with normal patella tracking.  Vascular exam: Pulses are 2+ and symmetric.  Neurologic exam: Distal motor and sensory examination is within normal limits.    Neurological: She is alert and oriented to person, place, and time.  Skin: Skin is warm and dry.  Psychiatric: She has a normal mood and affect. Her  behavior is normal.    Vital signs in last 24 hours: Temp:  [98.3 F (36.8 C)] 98.3 F (36.8 C) (04/27 1200) Pulse Rate:  [81] 81 (04/27 1200) BP: (174)/(90) 174/90 (04/27 1200) SpO2:  [98 %] 98 % (04/27 1200) Weight:  [90.2 kg] 90.2 kg (04/27 1200)  Labs:   Estimated body mass index is 37.56 kg/m as calculated from the following:   Height as of this encounter: 5\' 1"  (1.549 m).   Weight as of this encounter: 90.2 kg.   Imaging Review Plain radiographs demonstrate severe degenerative joint disease of the left knee(s). The overall alignment issignificant varus. The bone quality appears to be good for age and reported activity level.      Assessment/Plan:  End stage arthritis, left knee  Principal Problem:   Primary localized osteoarthritis of left knee Active Problems:   Benign essential HTN   Diabetic hyperosmolar non-ketotic state ([place])   DOE (dyspnea  on exertion)   S/P total knee arthroplasty, right   The patient history, physical examination, clinical judgment of the provider and imaging studies are consistent with end stage degenerative joint disease of the left knee(s) and total knee arthroplasty is deemed medically necessary. The treatment options including medical management, injection therapy arthroscopy and arthroplasty were discussed at length. The risks and benefits of total knee arthroplasty were presented and reviewed. The risks due to aseptic loosening, infection, stiffness, patella tracking problems, thromboembolic complications and other imponderables were discussed. The patient acknowledged the explanation, agreed to proceed with the plan and consent was signed. Patient is being admitted for inpatient treatment for surgery, pain control, PT, OT, prophylactic antibiotics, VTE prophylaxis, progressive ambulation and ADL's and discharge planning.   Patient was initially scheduled for surgery in May.  She was cancelled for uncontrolled diabetes.   She is now scheduled for 12/23/2019.  She has been cleared by Dr Buddy Duty in endocrinology pending her A1c being drawn tomorrow.  ELIQUIS POST OP DUE TO FAMILY HISTORY OF DVT IN MOM      Patient's anticipated LOS is less than 2 midnights, meeting these requirements: - Younger than 50 - Lives within 1 hour of care - Has a competent adult at home to recover with post-op recover - NO history of  - Chronic pain requiring opiods  - Diabetes  - Coronary Artery Disease  - Heart failure  - Heart attack  - Stroke  - DVT/VTE  - Cardiac arrhythmia  - Respiratory Failure/COPD  - Renal failure  - Anemia  - Advanced Liver disease

## 2019-10-24 NOTE — Patient Instructions (Signed)
DUE TO COVID-19 ONLY ONE VISITOR IS ALLOWED TO COME WITH YOU AND STAY IN THE WAITING ROOM ONLY DURING PRE OP AND PROCEDURE DAY OF SURGERY. THE 1 VISITOR MAY VISIT WITH YOU AFTER SURGERY IN YOUR PRIVATE ROOM DURING VISITING HOURS ONLY!  YOU NEED TO HAVE A COVID 19 TEST ON 10-30-19 @_______ , THIS TEST MUST BE DONE BEFORE SURGERY, COME  [address], [place] , [address].  ([place]) ONCE YOUR COVID TEST IS COMPLETED, PLEASE BEGIN THE QUARANTINE INSTRUCTIONS AS OUTLINED IN YOUR HANDOUT.                TIENNA ALAMI  10/24/2019   Your procedure is scheduled on: 11-03-19   Report to [facility] Main  Entrance    Report to admitting at 5:30 AM     Call this number if you have problems the morning of surgery [phone]    Remember: NO SOLID FOOD AFTER MIDNIGHT THE NIGHT PRIOR TO SURGERY. NOTHING BY MOUTH EXCEPT CLEAR LIQUIDS UNTIL 4:15 AM . PLEASE FINISH G2 DRINK PER SURGEON ORDER  WHICH NEEDS TO BE COMPLETED AT  4:15 AM.   CLEAR LIQUID DIET   Foods Allowed                                                                     Foods Excluded  Coffee and tea, regular and decaf                             liquids that you cannot  Plain Jell-O any favor except red or purple                                           see through such as: Fruit ices (not with fruit pulp)                                     milk, soups, orange juice  Iced Popsicles                                    All solid food Carbonated beverages, regular and diet                                    Cranberry, grape and apple juices Sports drinks like Gatorade Lightly seasoned clear broth or consume(fat free) Sugar, honey syrup   _____________________________________________________________________     Take these medicines the morning of surgery with A SIP OF WATER: Levothryoxine (Synthroid), and Metoprolol Succinate ((Toprol-XL)  BRUSH YOUR TEETH MORNING OF SURGERY AND RINSE YOUR MOUTH  OUT, NO CHEWING GUM CANDY OR MINTS.   DO NOT TAKE ANY DIABETIC MEDICATIONS DAY OF YOUR SURGERY  You may not have any metal on your body including hair pins and              piercings    Do not wear jewelry, make-up, lotions, powders or perfumes, deodorant             Do not wear nail polish on your fingernails.  Do not shave  48 hours prior to surgery.              Do not bring valuables to the hospital. [place].  Contacts, dentures or bridgework may not be worn into surgery.  You may bring small overnight bag     Special Instructions: N/A              Please read over the following fact sheets you were given: _____________________________________________________________________ How to Manage Your Diabetes Before and After Surgery  Why is it important to control my blood sugar before and after surgery? . Improving blood sugar levels before and after surgery helps healing and can limit problems. . A way of improving blood sugar control is eating a healthy diet by: o  Eating less sugar and carbohydrates o  Increasing activity/exercise o  Talking with your doctor about reaching your blood sugar goals . High blood sugars (greater than 180 mg/dL) can raise your risk of infections and slow your recovery, so you will need to focus on controlling your diabetes during the weeks before surgery. . Make sure that the doctor who takes care of your diabetes knows about your planned surgery including the date and location.  How do I manage my blood sugar before surgery? . Check your blood sugar at least 4 times a day, starting 2 days before surgery, to make sure that the level is not too high or low. o Check your blood sugar the morning of your surgery when you wake up and every 2 hours until you get to the Short Stay unit. . If your blood sugar is less than 70 mg/dL, you will need to treat for low blood sugar: o Do  not take insulin. o Treat a low blood sugar (less than 70 mg/dL) with  cup of clear juice (cranberry or apple), 4 glucose tablets, OR glucose gel. o Recheck blood sugar in 15 minutes after treatment (to make sure it is greater than 70 mg/dL). If your blood sugar is not greater than 70 mg/dL on recheck, call [phone] for further instructions. . Report your blood sugar to the short stay nurse when you get to Short Stay.  . If you are admitted to the hospital after surgery: o Your blood sugar will be checked by the staff and you will probably be given insulin after surgery (instead of oral diabetes medicines) to make sure you have good blood sugar levels. o The goal for blood sugar control after surgery is 80-180 mg/dL.   WHAT DO I DO ABOUT MY DIABETES MEDICATION?  Marland Kitchen Do not take oral diabetes medicines (pills) the morning of surgery.  . THE DAY BEFORE SURGERY, take your usual 12 units of Novolin N/Humulin N insulin. However, take on 6 Units at [employer] and Bedtime. Regarding your Novolin R and Humulin R take your usual 14-23 U at meals. However,  no bedtime dose.         . If your CBG is greater than 220 mg/dL, you may take  of  your sliding scale  . (correction) dose of  Novolin R and Humulin R insulin.      Reviewed and Endorsed by [facility] Patient Education Committee, August 2015            [facility] - Preparing for Surgery Before surgery, you can play an important role.  Because skin is not sterile, your skin needs to be as free of germs as possible.  You can reduce the number of germs on your skin by washing with CHG (chlorahexidine gluconate) soap before surgery.  CHG is an antiseptic cleaner which kills germs and bonds with the skin to continue killing germs even after washing. Please DO NOT use if you have an allergy to CHG or antibacterial soaps.  If your skin becomes reddened/irritated stop using the CHG and inform your nurse when you arrive at Short Stay. Do not shave  (including legs and underarms) for at least 48 hours prior to the first CHG shower.  You may shave your face/neck. Please follow these instructions carefully:  1.  Shower with CHG Soap the night before surgery and the  morning of Surgery.  2.  If you choose to wash your hair, wash your hair first as usual with your  normal  shampoo.  3.  After you shampoo, rinse your hair and body thoroughly to remove the  shampoo.                           4.  Use CHG as you would any other liquid soap.  You can apply chg directly  to the skin and wash                       Gently with a scrungie or clean washcloth.  5.  Apply the CHG Soap to your body ONLY FROM THE NECK DOWN.   Do not use on face/ open                           Wound or open sores. Avoid contact with eyes, ears mouth and genitals (private parts).                       Wash face,  Genitals (private parts) with your normal soap.             6.  Wash thoroughly, paying special attention to the area where your surgery  will be performed.  7.  Thoroughly rinse your body with warm water from the neck down.  8.  DO NOT shower/wash with your normal soap after using and rinsing off  the CHG Soap.                9.  Pat yourself dry with a clean towel.            10.  Wear clean pajamas.            11.  Place clean sheets on your bed the night of your first shower and do not  sleep with pets. Day of Surgery : Do not apply any lotions/deodorants the morning of surgery.  Please wear clean clothes to the hospital/surgery center.  FAILURE TO FOLLOW THESE INSTRUCTIONS MAY RESULT IN THE CANCELLATION OF YOUR SURGERY PATIENT SIGNATURE_________________________________  NURSE SIGNATURE__________________________________  ________________________________________________________________________   Adam Phenix  An incentive spirometer is a tool that can help keep your  lungs clear and active. This tool measures how well you are filling your lungs with  each breath. Taking long deep breaths may help reverse or decrease the chance of developing breathing (pulmonary) problems (especially infection) following:  A long period of time when you are unable to move or be active. BEFORE THE PROCEDURE   If the spirometer includes an indicator to show your best effort, your nurse or respiratory therapist will set it to a desired goal.  If possible, sit up straight or lean slightly forward. Try not to slouch.  Hold the incentive spirometer in an upright position. INSTRUCTIONS FOR USE  1. Sit on the edge of your bed if possible, or sit up as far as you can in bed or on a chair. 2. Hold the incentive spirometer in an upright position. 3. Breathe out normally. 4. Place the mouthpiece in your mouth and seal your lips tightly around it. 5. Breathe in slowly and as deeply as possible, raising the piston or the ball toward the top of the column. 6. Hold your breath for 3-5 seconds or for as long as possible. Allow the piston or ball to fall to the bottom of the column. 7. Remove the mouthpiece from your mouth and breathe out normally. 8. Rest for a few seconds and repeat Steps 1 through 7 at least 10 times every 1-2 hours when you are awake. Take your time and take a few normal breaths between deep breaths. 9. The spirometer may include an indicator to show your best effort. Use the indicator as a goal to work toward during each repetition. 10. After each set of 10 deep breaths, practice coughing to be sure your lungs are clear. If you have an incision (the cut made at the time of surgery), support your incision when coughing by placing a pillow or rolled up towels firmly against it. Once you are able to get out of bed, walk around indoors and cough well. You may stop using the incentive spirometer when instructed by your caregiver.  RISKS AND COMPLICATIONS  Take your time so you do not get dizzy or light-headed.  If you are in pain, you may need to take or  ask for pain medication before doing incentive spirometry. It is harder to take a deep breath if you are having pain. AFTER USE  Rest and breathe slowly and easily.  It can be helpful to keep track of a log of your progress. Your caregiver can provide you with a simple table to help with this. If you are using the spirometer at home, follow these instructions: [place] IF:   You are having difficultly using the spirometer.  You have trouble using the spirometer as often as instructed.  Your pain medication is not giving enough relief while using the spirometer.  You develop fever of 100.5 F (38.1 C) or higher. SEEK IMMEDIATE MEDICAL CARE IF:   You cough up bloody sputum that had not been present before.  You develop fever of 102 F (38.9 C) or greater.  You develop worsening pain at or near the incision site. MAKE SURE YOU:   Understand these instructions.  Will watch your condition.  Will get help right away if you are not doing well or get worse. Document Released: 10/23/2006 Document Revised: 09/04/2011 Document Reviewed: 12/24/2006 [facility] Patient Information 2014 [place], [place].   ________________________________________________________________________

## 2019-10-24 NOTE — Progress Notes (Addendum)
PCP - Nyra Market, MD w/ surgery clearance on chart dated 10-22-19  Cardiologist - Fransico Him, MD LOV 09/21/16   Clearance also from Endocrinologist dated 10-22-19  Chest x-ray -  EKG -  Stress Test -  ECHO -  Cardiac Cath -   Sleep Study -  CPAP -   HGA1C  Fasting Blood Sugar -  Checks Blood Sugar _____ times a day  Blood Thinner Instructions: Aspirin Instructions: Last Dose:  Anesthesia review:   Patient denies shortness of breath, fever, cough and chest pain at PAT appointment   Patient verbalized understanding of instructions that were given to them at the PAT appointment. Patient was also instructed that they will need to review over the PAT instructions again at home before surgery.

## 2019-10-27 ENCOUNTER — Encounter (HOSPITAL_COMMUNITY): Payer: Self-pay

## 2019-10-27 ENCOUNTER — Other Ambulatory Visit: Payer: Self-pay

## 2019-10-27 ENCOUNTER — Encounter (HOSPITAL_COMMUNITY)
Admission: RE | Admit: 2019-10-27 | Discharge: 2019-10-27 | Disposition: A | Discharge status: Home / Self Care | Payer: Medicare HMO | Source: Ambulatory Visit | Attending: Orthopedic Surgery | Admitting: Orthopedic Surgery

## 2019-10-27 DIAGNOSIS — Z794 Long term (current) use of insulin: Secondary | ICD-10-CM | POA: Insufficient documentation

## 2019-10-27 DIAGNOSIS — Z79899 Other long term (current) drug therapy: Secondary | ICD-10-CM | POA: Diagnosis not present

## 2019-10-27 DIAGNOSIS — Z7982 Long term (current) use of aspirin: Secondary | ICD-10-CM | POA: Diagnosis not present

## 2019-10-27 DIAGNOSIS — E538 Deficiency of other specified B group vitamins: Secondary | ICD-10-CM | POA: Insufficient documentation

## 2019-10-27 DIAGNOSIS — I10 Essential (primary) hypertension: Secondary | ICD-10-CM | POA: Insufficient documentation

## 2019-10-27 DIAGNOSIS — M1712 Unilateral primary osteoarthritis, left knee: Secondary | ICD-10-CM | POA: Insufficient documentation

## 2019-10-27 DIAGNOSIS — E039 Hypothyroidism, unspecified: Secondary | ICD-10-CM | POA: Diagnosis not present

## 2019-10-27 DIAGNOSIS — E109 Type 1 diabetes mellitus without complications: Secondary | ICD-10-CM | POA: Diagnosis not present

## 2019-10-27 DIAGNOSIS — Z96651 Presence of right artificial knee joint: Secondary | ICD-10-CM | POA: Diagnosis not present

## 2019-10-27 DIAGNOSIS — I440 Atrioventricular block, first degree: Secondary | ICD-10-CM | POA: Diagnosis not present

## 2019-10-27 DIAGNOSIS — Z85850 Personal history of malignant neoplasm of thyroid: Secondary | ICD-10-CM | POA: Diagnosis not present

## 2019-10-27 DIAGNOSIS — Z6835 Body mass index (BMI) 35.0-35.9, adult: Secondary | ICD-10-CM | POA: Diagnosis not present

## 2019-10-27 DIAGNOSIS — Z79890 Hormone replacement therapy (postmenopausal): Secondary | ICD-10-CM | POA: Diagnosis not present

## 2019-10-27 DIAGNOSIS — E785 Hyperlipidemia, unspecified: Secondary | ICD-10-CM | POA: Insufficient documentation

## 2019-10-27 DIAGNOSIS — Z01818 Encounter for other preprocedural examination: Secondary | ICD-10-CM | POA: Diagnosis not present

## 2019-10-27 DIAGNOSIS — E669 Obesity, unspecified: Secondary | ICD-10-CM | POA: Insufficient documentation

## 2019-10-27 HISTORY — DX: Malignant (primary) neoplasm, unspecified: C80.1

## 2019-10-27 LAB — CBC WITH DIFFERENTIAL/PLATELET
Abs Immature Granulocytes: 0.04 10*3/uL (ref 0.00–0.07)
Basophils Absolute: 0.1 10*3/uL (ref 0.0–0.1)
Basophils Relative: 1 %
Eosinophils Absolute: 0.2 10*3/uL (ref 0.0–0.5)
Eosinophils Relative: 3 %
HCT: 38.5 % (ref 36.0–46.0)
Hemoglobin: 12.8 g/dL (ref 12.0–15.0)
Immature Granulocytes: 1 %
Lymphocytes Relative: 22 %
Lymphs Abs: 1.6 10*3/uL (ref 0.7–4.0)
MCH: 28 pg (ref 26.0–34.0)
MCHC: 33.2 g/dL (ref 30.0–36.0)
MCV: 84.2 fL (ref 80.0–100.0)
Monocytes Absolute: 0.9 10*3/uL (ref 0.1–1.0)
Monocytes Relative: 13 %
Neutro Abs: 4.4 10*3/uL (ref 1.7–7.7)
Neutrophils Relative %: 60 %
Platelets: 301 10*3/uL (ref 150–400)
RBC: 4.57 MIL/uL (ref 3.87–5.11)
RDW: 14.4 % (ref 11.5–15.5)
WBC: 7.3 10*3/uL (ref 4.0–10.5)
nRBC: 0 % (ref 0.0–0.2)

## 2019-10-27 LAB — COMPREHENSIVE METABOLIC PANEL
ALT: 17 U/L (ref 0–44)
AST: 23 U/L (ref 15–41)
Albumin: 3.9 g/dL (ref 3.5–5.0)
Alkaline Phosphatase: 70 U/L (ref 38–126)
Anion gap: 11 (ref 5–15)
BUN: 17 mg/dL (ref 8–23)
CO2: 24 mmol/L (ref 22–32)
Calcium: 9.2 mg/dL (ref 8.9–10.3)
Chloride: 102 mmol/L (ref 98–111)
Creatinine, Ser: 0.99 mg/dL (ref 0.44–1.00)
GFR calc Af Amer: >60 mL/min (ref >60)
GFR calc non Af Amer: 60 mL/min — ABNORMAL LOW (ref >60)
Glucose, Bld: 408 mg/dL — ABNORMAL HIGH (ref 70–99)
Potassium: 4.1 mmol/L (ref 3.5–5.1)
Sodium: 137 mmol/L (ref 135–145)
Total Bilirubin: 0.5 mg/dL (ref 0.3–1.2)
Total Protein: 7.5 g/dL (ref 6.5–8.1)

## 2019-10-27 LAB — GLUCOSE, CAPILLARY: Glucose-Capillary: 412 mg/dL — ABNORMAL HIGH (ref 70–99)

## 2019-10-27 LAB — HEMOGLOBIN A1C
Hgb A1c MFr Bld: 8 % — ABNORMAL HIGH (ref 4.8–5.6)
Mean Plasma Glucose: 182.9 mg/dL

## 2019-10-27 LAB — PROTIME-INR
INR: 1.1 (ref 0.8–1.2)
Prothrombin Time: 13.5 seconds (ref 11.4–15.2)

## 2019-10-27 LAB — SURGICAL PCR SCREEN
MRSA, PCR: NEGATIVE
Staphylococcus aureus: NEGATIVE

## 2019-10-27 LAB — APTT: aPTT: 30 seconds (ref 24–36)

## 2019-10-27 NOTE — Progress Notes (Signed)
  A1C and CMP routed via epic to DR Noemi Chapel.

## 2019-10-28 DIAGNOSIS — I10 Essential (primary) hypertension: Secondary | ICD-10-CM | POA: Diagnosis not present

## 2019-10-28 DIAGNOSIS — E039 Hypothyroidism, unspecified: Secondary | ICD-10-CM | POA: Diagnosis not present

## 2019-10-28 DIAGNOSIS — E063 Autoimmune thyroiditis: Secondary | ICD-10-CM | POA: Diagnosis not present

## 2019-10-28 DIAGNOSIS — Z794 Long term (current) use of insulin: Secondary | ICD-10-CM | POA: Diagnosis not present

## 2019-10-28 DIAGNOSIS — E1065 Type 1 diabetes mellitus with hyperglycemia: Secondary | ICD-10-CM | POA: Diagnosis not present

## 2019-10-28 LAB — URINE CULTURE: Culture: <10000 — AB

## 2019-10-28 NOTE — Progress Notes (Addendum)
Anesthesia Chart Review   Case: [id number] Date/Time: 11/03/19 0700   Procedure: TOTAL KNEE ARTHROPLASTY (Left Knee)   Anesthesia type: Spinal   Pre-op diagnosis: DEGENERATIVE JOINT DISEASE LEFT KNEE   Location: [facility] ROOM 07 / [facility]   Surgeons: Elsie Saas, MD      DISCUSSION:63 y.o. never smoker with h/o DM , HTN, left knee DJD scheduled for above procedure 11/03/2019 with Dr. Elsie Saas.   Clearance received from PCP, Dr. Harlan Stains, which states pt is optimized for surgery from a medical and cardiac standpoint (on chart).   Elevated blood sugary at PAT visit, 412.  Pt reports she has not taken her DM medications today and drank juice before coming.  All other labs normal.  A1C 8.0.  Surgeon made aware.  Pt reports she is scheduled to see her endocrinologist, Dr. Delrae Rend.   VS: BP (!) 166/73   Pulse 84   Temp 36.9 C   Resp 18   Ht 5\' 1"  (1.549 m)   Wt 86.3 kg   SpO2 99%   BMI 35.95 kg/m   PROVIDERS: Harlan Stains, MD is PCP   Delrae Rend, MD is Endocrinologist  LABS: Elevated glucose and A1C, surgeon made aware (all labs ordered are listed, but only abnormal results are displayed)  Labs Reviewed  GLUCOSE, CAPILLARY - Abnormal; Notable for the following components:      Result Value   Glucose-Capillary 412 (*)    All other components within normal limits     IMAGES:   EKG: 10/27/2019 Rate 83 bpm Sinus rhythm with 1st degree AV block  No significant change since last tracing   CV: Myocardial Perfusion 10/09/2016  The patient walked for 4:00 of a Bruce protocol GXT . Peak HR of 148 which is 93% predicted maximal HR .  There were no ST or T wave changes to suggest ischemia  Nuclear stress EF: 57%. The left ventricular ejection fraction is normal (55-65%).  The study is normal. This is a low risk study.  Echo 10/09/2016 Study Conclusions   - Left ventricle: The cavity size was normal. Systolic function was  normal. The estimated ejection  fraction was in the range of 55%  to 60%. Wall motion was normal; there were no regional wall  motion abnormalities. There was an increased relative  contribution of atrial contraction to ventricular filling.  Doppler parameters are consistent with abnormal left ventricular  relaxation (grade 1 diastolic dysfunction).  - Aortic valve: Trileaflet; mildly thickened, mildly calcified  leaflets.  Past Medical History:  Diagnosis Date  . Benign essential HTN   . Cancer (HCC)    hx of thyroid cancer   . Carpal tunnel syndrome   . Diabetes mellitus without complication ([place])    type 1 - followed by Dr Buddy Duty   . Dyslipidemia   . Hypothyroidism   . Jaundice, unspecified, not of newborn    Scleral icterus since age 1  . Obesity, unspecified   . Other B-complex deficiencies   . Primary localized osteoarthritis of left knee 10/21/2019  . Primary localized osteoarthritis of right knee 11/14/2017  . S/P TKR (total knee replacement), left 10/21/2019  . Unspecified monoarthritis, forearm     Past Surgical History:  Procedure Laterality Date  . CARDIAC CATHETERIZATION    . CESAREAN SECTION     x 2  . CHOLECYSTECTOMY    . TEAR DUCT PROBING    . TOTAL KNEE ARTHROPLASTY Right 11/26/2017  . TOTAL KNEE ARTHROPLASTY Right 11/26/2017  Procedure: RIGHT TOTAL KNEE ARTHROPLASTY;  Surgeon: Elsie Saas, MD;  Location: [facility];  Service: Orthopedics;  Laterality: Right;  . WISDOM TOOTH EXTRACTION      MEDICATIONS: . aspirin EC 325 MG EC tablet  . aspirin EC 81 MG tablet  . aspirin-acetaminophen-caffeine (EXCEDRIN MIGRAINE) 250-250-65 MG tablet  . Calcium Carb-Cholecalciferol (CALCIUM 600+D3) 600-800 MG-UNIT TABS  . ferrous sulfate 325 (65 FE) MG tablet  . insulin NPH Human (HUMULIN N,NOVOLIN N) 100 UNIT/ML injection  . insulin regular (NOVOLIN R,HUMULIN R) 100 units/mL injection  . levothyroxine (SYNTHROID, LEVOTHROID) 100 MCG tablet  . levothyroxine (SYNTHROID, LEVOTHROID) 112 MCG  tablet  . lisinopril-hydrochlorothiazide (ZESTORETIC) 10-12.5 MG tablet  . meloxicam (MOBIC) 15 MG tablet  . metoCLOPramide (REGLAN) 5 MG tablet  . metoprolol succinate (TOPROL-XL) 25 MG 24 hr tablet  . simvastatin (ZOCOR) 20 MG tablet  . tiZANidine (ZANAFLEX) 2 MG tablet  . vitamin B-12 (CYANOCOBALAMIN) 500 MCG tablet   No current facility-administered medications for this encounter.    Maia Plan [facility] Pre-Surgical Testing [phone] 10/28/19  12:38 PM

## 2019-10-28 NOTE — Progress Notes (Signed)
Patient to see endocrinolgoist -Dr Buddy Duty today in regards to surgery.

## 2019-10-28 NOTE — Progress Notes (Signed)
Urine culture results from 10/27/19 routed via epic to Dr Noemi Chapel.

## 2019-10-30 ENCOUNTER — Other Ambulatory Visit (HOSPITAL_COMMUNITY): Payer: Medicare HMO

## 2019-10-30 DIAGNOSIS — E039 Hypothyroidism, unspecified: Secondary | ICD-10-CM | POA: Diagnosis not present

## 2019-10-30 DIAGNOSIS — E1065 Type 1 diabetes mellitus with hyperglycemia: Secondary | ICD-10-CM | POA: Diagnosis not present

## 2019-10-30 DIAGNOSIS — E063 Autoimmune thyroiditis: Secondary | ICD-10-CM | POA: Diagnosis not present

## 2019-10-30 DIAGNOSIS — Z794 Long term (current) use of insulin: Secondary | ICD-10-CM | POA: Diagnosis not present

## 2019-10-30 DIAGNOSIS — I10 Essential (primary) hypertension: Secondary | ICD-10-CM | POA: Diagnosis not present

## 2019-11-06 DIAGNOSIS — E063 Autoimmune thyroiditis: Secondary | ICD-10-CM | POA: Diagnosis not present

## 2019-11-06 DIAGNOSIS — E109 Type 1 diabetes mellitus without complications: Secondary | ICD-10-CM | POA: Diagnosis not present

## 2019-11-06 DIAGNOSIS — E039 Hypothyroidism, unspecified: Secondary | ICD-10-CM | POA: Diagnosis not present

## 2019-11-06 DIAGNOSIS — Z794 Long term (current) use of insulin: Secondary | ICD-10-CM | POA: Diagnosis not present

## 2019-11-06 DIAGNOSIS — I10 Essential (primary) hypertension: Secondary | ICD-10-CM | POA: Diagnosis not present

## 2019-12-08 DIAGNOSIS — E559 Vitamin D deficiency, unspecified: Secondary | ICD-10-CM | POA: Diagnosis not present

## 2019-12-08 DIAGNOSIS — E109 Type 1 diabetes mellitus without complications: Secondary | ICD-10-CM | POA: Diagnosis not present

## 2019-12-08 DIAGNOSIS — E785 Hyperlipidemia, unspecified: Secondary | ICD-10-CM | POA: Diagnosis not present

## 2019-12-08 DIAGNOSIS — E538 Deficiency of other specified B group vitamins: Secondary | ICD-10-CM | POA: Diagnosis not present

## 2019-12-08 DIAGNOSIS — I10 Essential (primary) hypertension: Secondary | ICD-10-CM | POA: Diagnosis not present

## 2019-12-08 DIAGNOSIS — M1712 Unilateral primary osteoarthritis, left knee: Secondary | ICD-10-CM | POA: Diagnosis not present

## 2019-12-16 DIAGNOSIS — E063 Autoimmune thyroiditis: Secondary | ICD-10-CM | POA: Diagnosis not present

## 2019-12-16 DIAGNOSIS — Z794 Long term (current) use of insulin: Secondary | ICD-10-CM | POA: Diagnosis not present

## 2019-12-16 DIAGNOSIS — E039 Hypothyroidism, unspecified: Secondary | ICD-10-CM | POA: Diagnosis not present

## 2019-12-16 DIAGNOSIS — E109 Type 1 diabetes mellitus without complications: Secondary | ICD-10-CM | POA: Diagnosis not present

## 2019-12-16 DIAGNOSIS — I10 Essential (primary) hypertension: Secondary | ICD-10-CM | POA: Diagnosis not present

## 2019-12-22 NOTE — Patient Instructions (Addendum)
DUE TO COVID-19 ONLY ONE VISITOR IS ALLOWED TO COME WITH YOU AND STAY IN THE WAITING ROOM ONLY DURING PRE OP AND PROCEDURE DAY OF SURGERY. THE 2 VISITORS  MAY VISIT WITH YOU AFTER SURGERY IN YOUR PRIVATE ROOM DURING VISITING HOURS ONLY!  YOU NEED TO HAVE A COVID 19 TEST ON__7/8____ @_______ , THIS TEST MUST BE DONE BEFORE SURGERY, COME  Victoria [name], [place] , [id number].  ([place]) ONCE YOUR COVID TEST IS COMPLETED, PLEASE BEGIN THE QUARANTINE INSTRUCTIONS AS OUTLINED IN YOUR HANDOUT.                Victoria [name]   Your procedure is scheduled on: 01/05/20   Report to [place]  Entrance  Report to admitting at 5:30  AM     Call this number if you have problems the morning of surgery [place], NO CHEWING GUM [place].   Do not eat food After Midnight.   YOU MAY HAVE CLEAR LIQUIDS FROM MIDNIGHT UNTIL 4:30 AM.   At 4:30 AM Please finish the prescribed Pre-Surgery Gatorade drink  . Nothing by mouth after you finish the Gatorade drink !   Take these medicines the morning of surgery with A SIP OF WATER: Metoprolol, Levothyroxine   DO NOT TAKE ANY DIABETIC MEDICATIONS DAY OF YOUR SURGERY  How to Manage Your Diabetes Before and After Surgery  Why is it important to control my blood sugar before and after surgery? . Improving blood sugar levels before and after surgery helps healing and can limit problems. . A way of improving blood sugar control is eating a healthy diet by: o  Eating less sugar and carbohydrates o  Increasing activity/exercise o  Talking with your doctor about reaching your blood sugar goals . High blood sugars (greater than 180 mg/dL) can raise your risk of infections and slow your recovery, so you will need to focus on controlling your diabetes during the weeks before surgery. . Make sure that the doctor who takes care of your diabetes knows about  your planned surgery including the date and location.  How do I manage my blood sugar before surgery? . Check your blood sugar at least 4 times a day, starting 2 days before surgery, to make sure that the level is not too high or low. o Check your blood sugar the morning of your surgery when you wake up and every 2 hours until you get to the Short Stay unit. . If your blood sugar is less than 70 mg/dL, you will need to treat for low blood sugar: o Do not take insulin. o Treat a low blood sugar (less than 70 mg/dL) with  cup of clear juice (cranberry or apple), 4 glucose tablets, OR glucose gel. o Recheck blood sugar in 15 minutes after treatment (to make sure it is greater than 70 mg/dL). If your blood sugar is not greater than 70 mg/dL on recheck, call [phone] for further instructions. . Report your blood sugar to the short stay nurse when you get to Short Stay.  . If you are admitted to the hospital after surgery: o Your blood sugar will be checked by the staff and you will probably be given insulin after surgery (instead of oral diabetes medicines) to make sure you have good blood sugar levels. o The goal for blood sugar control after surgery is 80-180 mg/dL.  WHAT DO I DO ABOUT MY DIABETES MEDICATION?  Marland Kitchen Do not take oral diabetes medicines (pills) the morning of surgery.  . THE NIGHT BEFORE SURGERY, take 6    units of NPH  insulin.       . THE MORNING OF SURGERY, take 6  units of  NPH  insulin.  . The day of surgery, do not take other diabetes injectables, including Byetta (exenatide), Bydureon (exenatide ER), Victoza (liraglutide), or Trulicity (dulaglutide).  . If your CBG is greater than 220 mg/dL, you may take  of your sliding scale  . (correction) dose of insulin.                                    You may not have any metal on your body including hair pins and              piercings              Do not wear jewelry, make-up, lotions, powders or perfumes,  deodorant             Do not wear nail polish on your fingernails.             Do not shave  48 hours prior to surgery.     Do not bring valuables to the hospital. [place].  Contacts, dentures or bridgework may not be worn into surgery.                Please read over the following fact sheets you were given: _____________________________________________________________________  [facility] - Preparing for Surgery Before surgery, you can play an important role.   Because skin is not sterile, your skin needs to be as free of germs as possible .  You can reduce the number of germs on your skin by washing with CHG (chlorahexidine gluconate) soap before surgery .  CHG is an antiseptic cleaner which kills germs and bonds with the skin to continue killing germs even after washing. Please DO NOT use if you have an allergy to CHG or antibacterial soaps.   If your skin becomes reddened/irritated stop using the CHG and inform your nurse when you arrive at Short Stay. Do not shave (including legs and underarms) for at least 48 hours prior to the first CHG shower.    Please follow these instructions carefully:  1.  Shower with CHG Soap the night before surgery and the  morning of Surgery.  2.  If you choose to wash your hair, wash your hair first as usual with your  normal  shampoo.  3.  After you shampoo, rinse your hair and body thoroughly to remove the  shampoo.                                        4.  Use CHG as you would any other liquid soap.  You can apply chg directly  to the skin and wash                       Gently with a scrungie or clean washcloth.  5.  Apply the CHG Soap to your body ONLY FROM THE NECK DOWN.  Do not use on face/ open                           Wound or open sores. Avoid contact with eyes, ears mouth and genitals (private parts).                       Wash face,  Genitals (private parts) with your normal soap.              6.  Wash thoroughly, paying special attention to the area where your surgery  will be performed.  7.  Thoroughly rinse your body with warm water from the neck down.  8.  DO NOT shower/wash with your normal soap after using and rinsing off  the CHG Soap.             9.  Pat yourself dry with a clean towel.            10.  Wear clean pajamas.            11.  Place clean sheets on your bed the night of your first shower and do not  sleep with pets. Day of Surgery : Do not apply any lotions/deodorants the morning of surgery.  Please wear clean clothes to the hospital/surgery center.  FAILURE TO FOLLOW THESE INSTRUCTIONS MAY RESULT IN THE CANCELLATION OF YOUR SURGERY PATIENT SIGNATURE_________________________________  NURSE SIGNATURE__________________________________  ________________________________________________________________________   Adam Phenix  An incentive spirometer is a tool that can help keep your lungs clear and active. This tool measures how well you are filling your lungs with each breath. Taking long deep breaths may help reverse or decrease the chance of developing breathing (pulmonary) problems (especially infection) following:  A long period of time when you are unable to move or be active. BEFORE THE PROCEDURE   If the spirometer includes an indicator to show your best effort, your nurse or respiratory therapist will set it to a desired goal.  If possible, sit up straight or lean slightly forward. Try not to slouch.  Hold the incentive spirometer in an upright position. INSTRUCTIONS FOR USE  1. Sit on the edge of your bed if possible, or sit up as far as you can in bed or on a chair. 2. Hold the incentive spirometer in an upright position. 3. Breathe out normally. 4. Place the mouthpiece in your mouth and seal your lips tightly around it. 5. Breathe in slowly and as deeply as possible, raising the piston or the ball toward the top of the  column. 6. Hold your breath for 3-5 seconds or for as long as possible. Allow the piston or ball to fall to the bottom of the column. 7. Remove the mouthpiece from your mouth and breathe out normally. 8. Rest for a few seconds and repeat Steps 1 through 7 at least 10 times every 1-2 hours when you are awake. Take your time and take a few normal breaths between deep breaths. 9. The spirometer may include an indicator to show your best effort. Use the indicator as a goal to work toward during each repetition. 10. After each set of 10 deep breaths, practice coughing to be sure your lungs are clear. If you have an incision (the cut made at the time of surgery), support your incision when coughing by placing a pillow or rolled up towels firmly against it. Once you are able to get out of bed, walk  around indoors and cough well. You may stop using the incentive spirometer when instructed by your caregiver.  RISKS AND COMPLICATIONS  Take your time so you do not get dizzy or light-headed.  If you are in pain, you may need to take or ask for pain medication before doing incentive spirometry. It is harder to take a deep breath if you are having pain. AFTER USE  Rest and breathe slowly and easily.  It can be helpful to keep track of a log of your progress. Your caregiver can provide you with a simple table to help with this. If you are using the spirometer at home, follow these instructions: [place] IF:   You are having difficultly using the spirometer.  You have trouble using the spirometer as often as instructed.  Your pain medication is not giving enough relief while using the spirometer.  You develop fever of 100.5 F (38.1 C) or higher. SEEK IMMEDIATE MEDICAL CARE IF:   You cough up bloody sputum that had not been present before.  You develop fever of 102 F (38.9 C) or greater.  You develop worsening pain at or near the incision site. MAKE SURE YOU:   Understand these  instructions.  Will watch your condition.  Will get help right away if you are not doing well or get worse. Document Released: 10/23/2006 Document Revised: 09/04/2011 Document Reviewed: 12/24/2006 [facility] Patient Information 2014 [place], [place].   ________________________________________________________________________

## 2019-12-23 DIAGNOSIS — M1712 Unilateral primary osteoarthritis, left knee: Secondary | ICD-10-CM | POA: Diagnosis not present

## 2019-12-24 ENCOUNTER — Other Ambulatory Visit (HOSPITAL_COMMUNITY): Payer: Medicare HMO

## 2019-12-24 ENCOUNTER — Encounter (HOSPITAL_COMMUNITY): Payer: Self-pay

## 2019-12-24 ENCOUNTER — Encounter (HOSPITAL_COMMUNITY)
Admission: RE | Admit: 2019-12-24 | Discharge: 2019-12-24 | Disposition: A | Discharge status: Home / Self Care | Payer: Medicare HMO | Source: Ambulatory Visit | Attending: Orthopedic Surgery | Admitting: Orthopedic Surgery

## 2019-12-24 ENCOUNTER — Other Ambulatory Visit: Payer: Self-pay

## 2019-12-24 DIAGNOSIS — Z01812 Encounter for preprocedural laboratory examination: Secondary | ICD-10-CM | POA: Diagnosis not present

## 2019-12-24 LAB — COMPREHENSIVE METABOLIC PANEL
ALT: 22 U/L (ref 0–44)
AST: 25 U/L (ref 15–41)
Albumin: 4 g/dL (ref 3.5–5.0)
Alkaline Phosphatase: 78 U/L (ref 38–126)
Anion gap: 11 (ref 5–15)
BUN: 15 mg/dL (ref 8–23)
CO2: 27 mmol/L (ref 22–32)
Calcium: 8.9 mg/dL (ref 8.9–10.3)
Chloride: 101 mmol/L (ref 98–111)
Creatinine, Ser: 0.94 mg/dL (ref 0.44–1.00)
GFR calc Af Amer: >60 mL/min (ref >60)
GFR calc non Af Amer: >60 mL/min (ref >60)
Glucose, Bld: 95 mg/dL (ref 70–99)
Potassium: 3.6 mmol/L (ref 3.5–5.1)
Sodium: 139 mmol/L (ref 135–145)
Total Bilirubin: 0.6 mg/dL (ref 0.3–1.2)
Total Protein: 7.7 g/dL (ref 6.5–8.1)

## 2019-12-24 LAB — CBC WITH DIFFERENTIAL/PLATELET
Abs Immature Granulocytes: 0.04 10*3/uL (ref 0.00–0.07)
Basophils Absolute: 0 10*3/uL (ref 0.0–0.1)
Basophils Relative: 0 %
Eosinophils Absolute: 0.3 10*3/uL (ref 0.0–0.5)
Eosinophils Relative: 3 %
HCT: 40.1 % (ref 36.0–46.0)
Hemoglobin: 13.1 g/dL (ref 12.0–15.0)
Immature Granulocytes: 0 %
Lymphocytes Relative: 27 %
Lymphs Abs: 2.5 10*3/uL (ref 0.7–4.0)
MCH: 27.6 pg (ref 26.0–34.0)
MCHC: 32.7 g/dL (ref 30.0–36.0)
MCV: 84.4 fL (ref 80.0–100.0)
Monocytes Absolute: 1 10*3/uL (ref 0.1–1.0)
Monocytes Relative: 11 %
Neutro Abs: 5.5 10*3/uL (ref 1.7–7.7)
Neutrophils Relative %: 59 %
Platelets: 333 10*3/uL (ref 150–400)
RBC: 4.75 MIL/uL (ref 3.87–5.11)
RDW: 14.2 % (ref 11.5–15.5)
WBC: 9.3 10*3/uL (ref 4.0–10.5)
nRBC: 0 % (ref 0.0–0.2)

## 2019-12-24 LAB — SURGICAL PCR SCREEN
MRSA, PCR: NEGATIVE
Staphylococcus aureus: NEGATIVE

## 2019-12-24 LAB — GLUCOSE, CAPILLARY: Glucose-Capillary: 121 mg/dL — ABNORMAL HIGH (ref 70–99)

## 2019-12-24 LAB — PROTIME-INR
INR: 1 (ref 0.8–1.2)
Prothrombin Time: 13.2 seconds (ref 11.4–15.2)

## 2019-12-24 LAB — APTT: aPTT: 35 seconds (ref 24–36)

## 2019-12-24 NOTE — Progress Notes (Addendum)
COVID Vaccine Completed:Yes Date COVID Vaccine completed:09/30/19 COVID vaccine manufacturer: Pfizer       PCP - Dr. Deland Pretty Cardiologist - Dr. Ashok Norris  Chest x-ray - no EKG - 10/27/19 Stress Test -2018  ECHO - 2018 Cardiac Cath - 2006  Sleep Study - no CPAP - no  Fasting Blood Sugar - 100-150 Checks Blood Sugar QD_____ times a day  Blood Thinner Instructions:ASA Aspirin Instructions:Dr. Noemi Chapel said to stop it 7 days prior Last Dose:12/28/19  Anesthesia review:   Patient denies shortness of breath, fever, cough and chest pain at PAT appointment Yes   Patient verbalized understanding of instructions that were given to them at the PAT appointment. Patient was also instructed that they will need to review over the PAT instructions again at home before surgery. Yes  Pt reports that she sometimes gets SOB while climbing stairs and doing house work but not all the time

## 2019-12-25 LAB — URINE CULTURE: Culture: NO GROWTH

## 2019-12-25 LAB — HEMOGLOBIN A1C
Hgb A1c MFr Bld: 6.7 % — ABNORMAL HIGH (ref 4.8–5.6)
Mean Plasma Glucose: 146 mg/dL

## 2019-12-25 NOTE — Care Plan (Signed)
Ortho Bundle Case Management Note  Patient Details  Name: Victoria [name] MRN: [id number] Date of Birth: Oct 07, 1955  Met with patient and daughter in the office prior to surgery. Questions answered. She will discharge to home with daughters to assist. Has rolling walker. CPM ordered.  OPPT [facility] up at [facility]. Patient and MD in agreement with plan. Choice offered                   DME Arranged:  [facility] DME Agency:  [employer]  HH Arranged:    [place] Agency:     Additional Comments: Please contact me with any questions of if this plan should need to change.  Ladell Heads,  [place] Specialist  [phone] 12/25/2019, 3:00 PM

## 2020-01-01 ENCOUNTER — Other Ambulatory Visit (HOSPITAL_COMMUNITY)
Admission: RE | Admit: 2020-01-01 | Discharge: 2020-01-01 | Disposition: A | Discharge status: Home / Self Care | Payer: Medicare HMO | Source: Ambulatory Visit | Attending: Orthopedic Surgery | Admitting: Orthopedic Surgery

## 2020-01-01 DIAGNOSIS — Z20822 Contact with and (suspected) exposure to covid-19: Secondary | ICD-10-CM | POA: Diagnosis not present

## 2020-01-01 DIAGNOSIS — Z01812 Encounter for preprocedural laboratory examination: Secondary | ICD-10-CM | POA: Insufficient documentation

## 2020-01-01 LAB — SARS CORONAVIRUS 2 (TAT 6-24 HRS): SARS Coronavirus 2: NEGATIVE

## 2020-01-04 MED ORDER — BUPIVACAINE LIPOSOME 1.3 % IJ SUSP
20.0000 mL | Freq: Once | INTRAMUSCULAR | Status: DC
  Filled 2020-01-04: qty 20

## 2020-01-04 NOTE — Anesthesia Preprocedure Evaluation (Addendum)
Anesthesia Evaluation  Patient identified by MRN, date of birth, ID band Patient awake    Reviewed: Allergy & Precautions, NPO status , Patient's Chart, lab work & pertinent test results  Airway Mallampati: II  TM Distance: >3 FB Neck ROM: Full    Dental no notable dental hx.    Pulmonary neg pulmonary ROS,    Pulmonary exam normal breath sounds clear to auscultation       Cardiovascular hypertension, Pt. on medications and Pt. on home beta blockers Normal cardiovascular exam Rhythm:Regular Rate:Normal  ECG: SR, rate 83   Neuro/Psych negative neurological ROS  negative psych ROS   GI/Hepatic negative GI ROS, Neg liver ROS,   Endo/Other  diabetes, Insulin DependentHypothyroidism   Renal/GU negative Renal ROS     Musculoskeletal  (+) Arthritis ,   Abdominal (+) + obese,   Peds  Hematology HLD   Anesthesia Other Findings DEGENERATIVE JOINT DISEASE LEFT KNEE  Reproductive/Obstetrics                            Anesthesia Physical Anesthesia Plan  ASA: III  Anesthesia Plan: General and Regional   Post-op Pain Management: GA combined w/ Regional for post-op pain   Induction: Intravenous  PONV Risk Score and Plan: 3 and Ondansetron, Dexamethasone, Midazolam and Treatment may vary due to age or medical condition  Airway Management Planned: LMA  Additional Equipment:   Intra-op Plan:   Post-operative Plan: Extubation in OR  Informed Consent: I have reviewed the patients History and Physical, chart, labs and discussed the procedure including the risks, benefits and alternatives for the proposed anesthesia with the patient or authorized representative who has indicated his/her understanding and acceptance.     Dental advisory given  Plan Discussed with: CRNA  Anesthesia Plan Comments:         Anesthesia Quick Evaluation

## 2020-01-05 ENCOUNTER — Observation Stay (HOSPITAL_COMMUNITY)
Admission: RE | Admit: 2020-01-05 | Discharge: 2020-01-06 | Disposition: A | Discharge status: Home / Self Care | Payer: Medicare HMO | Attending: Orthopedic Surgery | Admitting: Orthopedic Surgery

## 2020-01-05 ENCOUNTER — Inpatient Hospital Stay (HOSPITAL_COMMUNITY): Payer: Medicare HMO | Admitting: Physician Assistant

## 2020-01-05 ENCOUNTER — Other Ambulatory Visit: Payer: Self-pay

## 2020-01-05 ENCOUNTER — Encounter (HOSPITAL_COMMUNITY)
Admission: RE | Disposition: A | Discharge status: Home / Self Care | Payer: Self-pay | Source: Home / Self Care | Attending: Orthopedic Surgery

## 2020-01-05 ENCOUNTER — Inpatient Hospital Stay (HOSPITAL_COMMUNITY): Payer: Medicare HMO | Admitting: Certified Registered"

## 2020-01-05 ENCOUNTER — Encounter (HOSPITAL_COMMUNITY): Payer: Self-pay | Admitting: Orthopedic Surgery

## 2020-01-05 DIAGNOSIS — E119 Type 2 diabetes mellitus without complications: Secondary | ICD-10-CM | POA: Insufficient documentation

## 2020-01-05 DIAGNOSIS — Z85850 Personal history of malignant neoplasm of thyroid: Secondary | ICD-10-CM | POA: Insufficient documentation

## 2020-01-05 DIAGNOSIS — Z794 Long term (current) use of insulin: Secondary | ICD-10-CM | POA: Diagnosis not present

## 2020-01-05 DIAGNOSIS — I10 Essential (primary) hypertension: Secondary | ICD-10-CM | POA: Diagnosis not present

## 2020-01-05 DIAGNOSIS — E039 Hypothyroidism, unspecified: Secondary | ICD-10-CM | POA: Diagnosis not present

## 2020-01-05 DIAGNOSIS — Z7982 Long term (current) use of aspirin: Secondary | ICD-10-CM | POA: Insufficient documentation

## 2020-01-05 DIAGNOSIS — M1712 Unilateral primary osteoarthritis, left knee: Secondary | ICD-10-CM | POA: Diagnosis not present

## 2020-01-05 DIAGNOSIS — R0609 Other forms of dyspnea: Secondary | ICD-10-CM | POA: Diagnosis present

## 2020-01-05 DIAGNOSIS — Z96651 Presence of right artificial knee joint: Secondary | ICD-10-CM

## 2020-01-05 DIAGNOSIS — Z79899 Other long term (current) drug therapy: Secondary | ICD-10-CM | POA: Insufficient documentation

## 2020-01-05 DIAGNOSIS — E1100 Type 2 diabetes mellitus with hyperosmolarity without nonketotic hyperglycemic-hyperosmolar coma (NKHHC): Secondary | ICD-10-CM | POA: Diagnosis present

## 2020-01-05 DIAGNOSIS — M25562 Pain in left knee: Secondary | ICD-10-CM | POA: Diagnosis present

## 2020-01-05 DIAGNOSIS — G8918 Other acute postprocedural pain: Secondary | ICD-10-CM | POA: Diagnosis not present

## 2020-01-05 HISTORY — PX: TOTAL KNEE ARTHROPLASTY: SHX125

## 2020-01-05 LAB — GLUCOSE, CAPILLARY
Glucose-Capillary: 320 mg/dL — ABNORMAL HIGH (ref 70–99)
Glucose-Capillary: 299 mg/dL — ABNORMAL HIGH (ref 70–99)
Glucose-Capillary: 336 mg/dL — ABNORMAL HIGH (ref 70–99)
Glucose-Capillary: 296 mg/dL — ABNORMAL HIGH (ref 70–99)
Glucose-Capillary: 287 mg/dL — ABNORMAL HIGH (ref 70–99)
Glucose-Capillary: 312 mg/dL — ABNORMAL HIGH (ref 70–99)
Glucose-Capillary: 341 mg/dL — ABNORMAL HIGH (ref 70–99)

## 2020-01-05 SURGERY — ARTHROPLASTY, KNEE, TOTAL
Anesthesia: Regional | Site: Knee | Laterality: Left

## 2020-01-05 MED ORDER — PHENOL 1.4 % MT LIQD: 1.0000 | OROMUCOSAL | Status: DC | PRN

## 2020-01-05 MED ORDER — DIPHENHYDRAMINE HCL 12.5 MG/5ML PO ELIX
12.5000 mg | ORAL_SOLUTION | ORAL | Status: DC | PRN
  Administered 2020-01-05: 12.5 mg via ORAL
  Filled 2020-01-05: qty 10

## 2020-01-05 MED ORDER — OXYCODONE HCL 5 MG PO TABS
5.0000 mg | ORAL_TABLET | ORAL | Status: DC | PRN
  Administered 2020-01-05: 5 mg via ORAL
  Administered 2020-01-05: 10 mg via ORAL
  Administered 2020-01-06 (×2): 5 mg via ORAL
  Filled 2020-01-05: qty 1
  Filled 2020-01-05: qty 2
  Filled 2020-01-05 (×2): qty 1

## 2020-01-05 MED ORDER — ONDANSETRON HCL 4 MG/2ML IJ SOLN
INTRAMUSCULAR | Status: AC
  Filled 2020-01-05: qty 2

## 2020-01-05 MED ORDER — POVIDONE-IODINE 7.5 % EX SOLN: Freq: Once | CUTANEOUS | Status: DC

## 2020-01-05 MED ORDER — ONDANSETRON HCL 4 MG/2ML IJ SOLN
4.0000 mg | Freq: Four times a day (QID) | INTRAMUSCULAR | Status: DC | PRN
  Administered 2020-01-05: 4 mg via INTRAVENOUS
  Filled 2020-01-05: qty 2

## 2020-01-05 MED ORDER — DEXAMETHASONE SODIUM PHOSPHATE 10 MG/ML IJ SOLN
INTRAMUSCULAR | Status: AC
  Filled 2020-01-05: qty 1

## 2020-01-05 MED ORDER — INSULIN NPH (HUMAN) (ISOPHANE) 100 UNIT/ML ~~LOC~~ SUSP
12.0000 [IU] | Freq: Four times a day (QID) | SUBCUTANEOUS | Status: DC
  Administered 2020-01-05 – 2020-01-06 (×3): 12 [IU] via SUBCUTANEOUS
  Filled 2020-01-05: qty 10

## 2020-01-05 MED ORDER — WATER FOR IRRIGATION, STERILE IR SOLN
Status: DC | PRN
  Administered 2020-01-05: 2000 mL

## 2020-01-05 MED ORDER — CLONIDINE HCL (ANALGESIA) 100 MCG/ML EP SOLN
EPIDURAL | Status: DC | PRN
  Administered 2020-01-05: 100 ug

## 2020-01-05 MED ORDER — OXYCODONE HCL 5 MG/5ML PO SOLN: 5.0000 mg | Freq: Once | ORAL | Status: DC | PRN

## 2020-01-05 MED ORDER — FENTANYL CITRATE (PF) 100 MCG/2ML IJ SOLN
INTRAMUSCULAR | Status: DC | PRN
  Administered 2020-01-05: 50 ug via INTRAVENOUS
  Administered 2020-01-05 (×2): 25 ug via INTRAVENOUS
  Administered 2020-01-05 (×2): 50 ug via INTRAVENOUS
  Administered 2020-01-05: 25 ug via INTRAVENOUS
  Administered 2020-01-05: 50 ug via INTRAVENOUS
  Administered 2020-01-05: 25 ug via INTRAVENOUS

## 2020-01-05 MED ORDER — INSULIN ASPART 100 UNIT/ML ~~LOC~~ SOLN
14.0000 [IU] | Freq: Three times a day (TID) | SUBCUTANEOUS | Status: DC
  Administered 2020-01-05: 7 [IU] via SUBCUTANEOUS

## 2020-01-05 MED ORDER — PHENYLEPHRINE 40 MCG/ML (10ML) SYRINGE FOR IV PUSH (FOR BLOOD PRESSURE SUPPORT)
PREFILLED_SYRINGE | INTRAVENOUS | Status: DC | PRN
  Administered 2020-01-05: 80 ug via INTRAVENOUS

## 2020-01-05 MED ORDER — POTASSIUM CHLORIDE IN NACL 20-0.9 MEQ/L-% IV SOLN
INTRAVENOUS | Status: DC
  Filled 2020-01-05 (×3): qty 1000

## 2020-01-05 MED ORDER — SIMVASTATIN 20 MG PO TABS
20.0000 mg | ORAL_TABLET | Freq: Every evening | ORAL | Status: DC
  Administered 2020-01-05: 20 mg via ORAL
  Filled 2020-01-05: qty 1

## 2020-01-05 MED ORDER — BUPIVACAINE-EPINEPHRINE 0.25% -1:200000 IJ SOLN
INTRAMUSCULAR | Status: DC | PRN
  Administered 2020-01-05: 30 mL

## 2020-01-05 MED ORDER — ROPIVACAINE HCL 5 MG/ML IJ SOLN
INTRAMUSCULAR | Status: DC | PRN
  Administered 2020-01-05: 30 mL via PERINEURAL

## 2020-01-05 MED ORDER — TRANEXAMIC ACID-NACL 1000-0.7 MG/100ML-% IV SOLN
1000.0000 mg | INTRAVENOUS | Status: AC
  Administered 2020-01-05: 1000 mg via INTRAVENOUS
  Filled 2020-01-05: qty 100

## 2020-01-05 MED ORDER — SODIUM CHLORIDE (PF) 0.9 % IJ SOLN
INTRAMUSCULAR | Status: DC | PRN
  Administered 2020-01-05: 50 mL

## 2020-01-05 MED ORDER — DEXAMETHASONE SODIUM PHOSPHATE 10 MG/ML IJ SOLN
8.0000 mg | Freq: Once | INTRAMUSCULAR | Status: AC
  Administered 2020-01-05: 8 mg via INTRAVENOUS

## 2020-01-05 MED ORDER — MIDAZOLAM HCL 2 MG/2ML IJ SOLN
INTRAMUSCULAR | Status: DC | PRN
  Administered 2020-01-05: 2 mg via INTRAVENOUS

## 2020-01-05 MED ORDER — METOCLOPRAMIDE HCL 5 MG/ML IJ SOLN: 5.0000 mg | Freq: Three times a day (TID) | INTRAMUSCULAR | Status: DC | PRN

## 2020-01-05 MED ORDER — SODIUM CHLORIDE 0.9 % IR SOLN
Status: DC | PRN
  Administered 2020-01-05: 2000 mL

## 2020-01-05 MED ORDER — MENTHOL 3 MG MT LOZG
1.0000 | LOZENGE | OROMUCOSAL | Status: DC | PRN
  Filled 2020-01-05: qty 9

## 2020-01-05 MED ORDER — CEFUROXIME SODIUM 1.5 G IV SOLR
INTRAVENOUS | Status: AC
  Filled 2020-01-05: qty 1.5

## 2020-01-05 MED ORDER — FENTANYL CITRATE (PF) 100 MCG/2ML IJ SOLN
INTRAMUSCULAR | Status: AC
  Filled 2020-01-05: qty 2

## 2020-01-05 MED ORDER — LIDOCAINE 2% (20 MG/ML) 5 ML SYRINGE
INTRAMUSCULAR | Status: DC | PRN
  Administered 2020-01-05: 40 mg via INTRAVENOUS

## 2020-01-05 MED ORDER — HYDROMORPHONE HCL 1 MG/ML IJ SOLN: 0.5000 mg | INTRAMUSCULAR | Status: DC | PRN

## 2020-01-05 MED ORDER — SODIUM CHLORIDE (PF) 0.9 % IJ SOLN
INTRAMUSCULAR | Status: AC
  Filled 2020-01-05: qty 50

## 2020-01-05 MED ORDER — CEFAZOLIN SODIUM-DEXTROSE 2-4 GM/100ML-% IV SOLN
2.0000 g | INTRAVENOUS | Status: AC
  Administered 2020-01-05: 2 g via INTRAVENOUS
  Filled 2020-01-05: qty 100

## 2020-01-05 MED ORDER — INSULIN ASPART 100 UNIT/ML ~~LOC~~ SOLN
SUBCUTANEOUS | Status: AC
  Filled 2020-01-05: qty 1

## 2020-01-05 MED ORDER — POVIDONE-IODINE 10 % EX SWAB: 2.0000 "application " | Freq: Once | CUTANEOUS | Status: DC

## 2020-01-05 MED ORDER — CEFAZOLIN SODIUM-DEXTROSE 2-4 GM/100ML-% IV SOLN
2.0000 g | Freq: Four times a day (QID) | INTRAVENOUS | Status: AC
  Administered 2020-01-05 (×2): 2 g via INTRAVENOUS
  Filled 2020-01-05 (×2): qty 100

## 2020-01-05 MED ORDER — TRANEXAMIC ACID-NACL 1000-0.7 MG/100ML-% IV SOLN
1000.0000 mg | Freq: Once | INTRAVENOUS | Status: AC
  Administered 2020-01-05: 1000 mg via INTRAVENOUS
  Filled 2020-01-05: qty 100

## 2020-01-05 MED ORDER — PROPOFOL 10 MG/ML IV BOLUS
INTRAVENOUS | Status: DC | PRN
  Administered 2020-01-05: 200 mg via INTRAVENOUS

## 2020-01-05 MED ORDER — LACTATED RINGERS IV SOLN: INTRAVENOUS | Status: DC

## 2020-01-05 MED ORDER — ONDANSETRON HCL 4 MG/2ML IJ SOLN
INTRAMUSCULAR | Status: DC | PRN
  Administered 2020-01-05: 4 mg via INTRAVENOUS

## 2020-01-05 MED ORDER — POVIDONE-IODINE 10 % EX SWAB
2.0000 "application " | Freq: Once | CUTANEOUS | Status: DC
  Administered 2020-01-05: 2 via TOPICAL

## 2020-01-05 MED ORDER — METOCLOPRAMIDE HCL 5 MG PO TABS: 5.0000 mg | ORAL_TABLET | Freq: Three times a day (TID) | ORAL | Status: DC | PRN

## 2020-01-05 MED ORDER — BUPIVACAINE LIPOSOME 1.3 % IJ SUSP
INTRAMUSCULAR | Status: DC | PRN
  Administered 2020-01-05: 20 mL

## 2020-01-05 MED ORDER — TIZANIDINE HCL 4 MG PO TABS: 2.0000 mg | ORAL_TABLET | Freq: Every evening | ORAL | Status: DC | PRN

## 2020-01-05 MED ORDER — OXYCODONE HCL 5 MG PO TABS: 5.0000 mg | ORAL_TABLET | Freq: Once | ORAL | Status: DC | PRN

## 2020-01-05 MED ORDER — MIDAZOLAM HCL 2 MG/2ML IJ SOLN
INTRAMUSCULAR | Status: AC
  Filled 2020-01-05: qty 2

## 2020-01-05 MED ORDER — HYDROMORPHONE HCL 1 MG/ML IJ SOLN
INTRAMUSCULAR | Status: AC
  Filled 2020-01-05: qty 1

## 2020-01-05 MED ORDER — ALUM & MAG HYDROXIDE-SIMETH 200-200-20 MG/5ML PO SUSP: 30.0000 mL | ORAL | Status: DC | PRN

## 2020-01-05 MED ORDER — PROMETHAZINE HCL 25 MG/ML IJ SOLN: 6.2500 mg | INTRAMUSCULAR | Status: DC | PRN

## 2020-01-05 MED ORDER — INSULIN ASPART 100 UNIT/ML ~~LOC~~ SOLN
0.0000 [IU] | Freq: Three times a day (TID) | SUBCUTANEOUS | Status: DC
  Administered 2020-01-05: 11 [IU] via SUBCUTANEOUS
  Administered 2020-01-05: 8 [IU] via SUBCUTANEOUS
  Administered 2020-01-06: 7 [IU] via SUBCUTANEOUS

## 2020-01-05 MED ORDER — BUPIVACAINE-EPINEPHRINE (PF) 0.25% -1:200000 IJ SOLN
INTRAMUSCULAR | Status: AC
  Filled 2020-01-05: qty 30

## 2020-01-05 MED ORDER — PROPOFOL 10 MG/ML IV BOLUS
INTRAVENOUS | Status: AC
  Filled 2020-01-05: qty 20

## 2020-01-05 MED ORDER — ACETAMINOPHEN 500 MG PO TABS
1000.0000 mg | ORAL_TABLET | Freq: Once | ORAL | Status: AC
  Administered 2020-01-05: 1000 mg via ORAL
  Filled 2020-01-05: qty 2

## 2020-01-05 MED ORDER — ASPIRIN EC 325 MG PO TBEC
325.0000 mg | DELAYED_RELEASE_TABLET | Freq: Every day | ORAL | Status: DC
  Administered 2020-01-06: 325 mg via ORAL
  Filled 2020-01-05: qty 1

## 2020-01-05 MED ORDER — METOPROLOL SUCCINATE ER 25 MG PO TB24
25.0000 mg | ORAL_TABLET | Freq: Every day | ORAL | Status: DC
  Administered 2020-01-06: 25 mg via ORAL
  Filled 2020-01-05: qty 1

## 2020-01-05 MED ORDER — ONDANSETRON HCL 4 MG PO TABS: 4.0000 mg | ORAL_TABLET | Freq: Four times a day (QID) | ORAL | Status: DC | PRN

## 2020-01-05 MED ORDER — ACETAMINOPHEN 500 MG PO TABS
1000.0000 mg | ORAL_TABLET | Freq: Four times a day (QID) | ORAL | Status: DC
  Administered 2020-01-05 – 2020-01-06 (×3): 1000 mg via ORAL
  Filled 2020-01-05 (×3): qty 2

## 2020-01-05 MED ORDER — INSULIN ASPART 100 UNIT/ML ~~LOC~~ SOLN
SUBCUTANEOUS | Status: DC | PRN
  Administered 2020-01-05: 5 [IU] via SUBCUTANEOUS

## 2020-01-05 MED ORDER — DOCUSATE SODIUM 100 MG PO CAPS
100.0000 mg | ORAL_CAPSULE | Freq: Two times a day (BID) | ORAL | Status: DC
  Administered 2020-01-05 – 2020-01-06 (×2): 100 mg via ORAL
  Filled 2020-01-05 (×2): qty 1

## 2020-01-05 MED ORDER — POLYETHYLENE GLYCOL 3350 17 G PO PACK
17.0000 g | PACK | Freq: Two times a day (BID) | ORAL | Status: DC
  Administered 2020-01-05 – 2020-01-06 (×2): 17 g via ORAL
  Filled 2020-01-05: qty 1

## 2020-01-05 MED ORDER — GABAPENTIN 300 MG PO CAPS
300.0000 mg | ORAL_CAPSULE | Freq: Every day | ORAL | Status: DC
  Administered 2020-01-05: 300 mg via ORAL
  Filled 2020-01-05: qty 1

## 2020-01-05 MED ORDER — HYDROMORPHONE HCL 1 MG/ML IJ SOLN
0.2500 mg | INTRAMUSCULAR | Status: DC | PRN
  Administered 2020-01-05 (×2): 0.5 mg via INTRAVENOUS

## 2020-01-05 MED ORDER — PROPOFOL 1000 MG/100ML IV EMUL
INTRAVENOUS | Status: AC
  Filled 2020-01-05: qty 100

## 2020-01-05 MED ORDER — LIDOCAINE 2% (20 MG/ML) 5 ML SYRINGE
INTRAMUSCULAR | Status: AC
  Filled 2020-01-05: qty 5

## 2020-01-05 MED ORDER — 0.9 % SODIUM CHLORIDE (POUR BTL) OPTIME
TOPICAL | Status: DC | PRN
  Administered 2020-01-05: 1000 mL

## 2020-01-05 SURGICAL SUPPLY — 65 items
APL PRP STRL LF DISP 70% ISPRP (MISCELLANEOUS) ×2
ATTUNE MED DOME PAT 32 KNEE (Knees) ×2 IMPLANT
ATTUNE PS FEM LT SZ 4 CEM KNEE (Femur) ×1 IMPLANT
ATTUNE PSRP INSR SZ4 7 KNEE (Insert) ×1 IMPLANT
BAG SPEC THK2 15X12 ZIP CLS (MISCELLANEOUS) ×1
BAG ZIPLOCK 12X15 (MISCELLANEOUS) ×2 IMPLANT
BASEPLATE TIBIAL ROTATING SZ 4 (Knees) ×2 IMPLANT
BLADE HEX COATED 2.75 (ELECTRODE) ×2 IMPLANT
BLADE SAGITTAL 25.0X1.19X90 (BLADE) ×2 IMPLANT
BLADE SAW SGTL 13X75X1.27 (BLADE) ×2 IMPLANT
BLADE SURG SZ10 CARB STEEL (BLADE) ×4 IMPLANT
BOWL SMART MIX CTS (DISPOSABLE) ×2 IMPLANT
BSPLAT TIB 4 CMNT ROT PLAT STR (Knees) ×1 IMPLANT
CEMENT HV SMART SET (Cement) ×4 IMPLANT
CHLORAPREP W/TINT 26 (MISCELLANEOUS) ×4 IMPLANT
CLSR STERI-STRIP ANTIMIC 1/2X4 (GAUZE/BANDAGES/DRESSINGS) ×1 IMPLANT
COVER SURGICAL LIGHT HANDLE (MISCELLANEOUS) ×2 IMPLANT
COVER WAND RF STERILE (DRAPES) IMPLANT
CUFF TOURN SGL QUICK 34 (TOURNIQUET CUFF) ×2
CUFF TRNQT CYL 34X4.125X (TOURNIQUET CUFF) ×1 IMPLANT
DECANTER SPIKE VIAL GLASS SM (MISCELLANEOUS) ×4 IMPLANT
DRAPE ORTHO SPLIT 77X108 STRL (DRAPES) ×2
DRAPE SHEET LG 3/4 BI-LAMINATE (DRAPES) ×2 IMPLANT
DRAPE SURG ORHT 6 SPLT 77X108 (DRAPES) ×1 IMPLANT
DRAPE U-SHAPE 47X51 STRL (DRAPES) ×2 IMPLANT
DRSG AQUACEL AG ADV 3.5X10 (GAUZE/BANDAGES/DRESSINGS) ×2 IMPLANT
DRSG AQUACEL AG ADV 3.5X14 (GAUZE/BANDAGES/DRESSINGS) ×1 IMPLANT
DRSG PAD ABDOMINAL 8X10 ST (GAUZE/BANDAGES/DRESSINGS) ×3 IMPLANT
ELECT REM PT RETURN 15FT ADLT (MISCELLANEOUS) ×2 IMPLANT
GAUZE SPONGE 4X4 12PLY STRL (GAUZE/BANDAGES/DRESSINGS) ×1 IMPLANT
GLOVE BIO SURGEON STRL SZ7 (GLOVE) ×2 IMPLANT
GLOVE BIOGEL PI IND STRL 7.0 (GLOVE) ×1 IMPLANT
GLOVE BIOGEL PI IND STRL 7.5 (GLOVE) ×1 IMPLANT
GLOVE BIOGEL PI INDICATOR 7.0 (GLOVE) ×1
GLOVE BIOGEL PI INDICATOR 7.5 (GLOVE) ×1
GLOVE SS BIOGEL STRL SZ 7.5 (GLOVE) ×1 IMPLANT
GLOVE SUPERSENSE BIOGEL SZ 7.5 (GLOVE) ×1
GOWN STRL REUS W/ TWL LRG LVL3 (GOWN DISPOSABLE) ×2 IMPLANT
GOWN STRL REUS W/TWL LRG LVL3 (GOWN DISPOSABLE) ×4
HANDPIECE INTERPULSE COAX TIP (DISPOSABLE) ×2
HOLDER FOLEY CATH W/STRAP (MISCELLANEOUS) IMPLANT
HOOD PEEL AWAY FLYTE STAYCOOL (MISCELLANEOUS) ×6 IMPLANT
KIT TURNOVER KIT A (KITS) ×2 IMPLANT
MANIFOLD NEPTUNE II (INSTRUMENTS) ×2 IMPLANT
MARKER SKIN DUAL TIP RULER LAB (MISCELLANEOUS) ×2 IMPLANT
NDL SAFETY ECLIPSE 18X1.5 (NEEDLE) ×1 IMPLANT
NEEDLE HYPO 18GX1.5 SHARP (NEEDLE) ×2
NS IRRIG 1000ML POUR BTL (IV SOLUTION) ×2 IMPLANT
PACK TOTAL KNEE CUSTOM (KITS) ×2 IMPLANT
PENCIL SMOKE EVACUATOR (MISCELLANEOUS) ×2 IMPLANT
PIN DRILL FIX HALF THREAD (BIT) ×1 IMPLANT
PIN STEINMAN FIXATION KNEE (PIN) ×1 IMPLANT
PROTECTOR NERVE ULNAR (MISCELLANEOUS) ×2 IMPLANT
SET HNDPC FAN SPRY TIP SCT (DISPOSABLE) ×1 IMPLANT
STAPLER VISISTAT 35W (STAPLE) IMPLANT
STRIP CLOSURE SKIN 1/2X4 (GAUZE/BANDAGES/DRESSINGS) ×2 IMPLANT
SUT MNCRL AB 3-0 PS2 18 (SUTURE) ×2 IMPLANT
SUT VIC AB 0 CT1 36 (SUTURE) ×2 IMPLANT
SUT VIC AB 1 CT1 36 (SUTURE) ×2 IMPLANT
SUT VIC AB 2-0 CT1 27 (SUTURE) ×4
SUT VIC AB 2-0 CT1 TAPERPNT 27 (SUTURE) ×2 IMPLANT
SYR CONTROL 10ML LL (SYRINGE) ×6 IMPLANT
TRAY FOLEY MTR SLVR 14FR STAT (SET/KITS/TRAYS/PACK) ×2 IMPLANT
WATER STERILE IRR 1000ML POUR (IV SOLUTION) ×4 IMPLANT
YANKAUER SUCT BULB TIP 10FT TU (MISCELLANEOUS) ×2 IMPLANT

## 2020-01-05 NOTE — Interval H&P Note (Signed)
History and Physical Interval Note:  01/05/2020 6:31 AM  Victoria [name]  has presented today for surgery, with the diagnosis of [place].  The various methods of treatment have been discussed with the patient and family. After consideration of risks, benefits and other options for treatment, the patient has consented to  Procedure(s): TOTAL KNEE ARTHROPLASTY (Left) as a surgical intervention.  The patient's history has been reviewed, patient examined, no change in status, stable for surgery.  I have reviewed the patient's chart and labs.  Questions were answered to the patient's satisfaction.     Lorn Junes

## 2020-01-05 NOTE — Evaluation (Signed)
Physical Therapy Evaluation Patient Details Name: Victoria [name] MRN: [id number] DOB: 1955-07-01 Today's Date: 01/05/2020   History of Present Illness  Patient is 64 y.o. 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    Clinical Impression  Victoria [name] is a 64 y.o. female POD 0 s/p Lt TKA. Patient reports independence with [facility] for mobility at baseline. Patient is now limited by functional impairments (see PT problem list below) and requires min assist for transfers and gait with RW. Patient was able to ambulate ~50 feet with RW and min assist. Patient instructed in exercise to facilitate ROM and circulation. Patient will benefit from continued skilled PT interventions to address impairments and progress towards PLOF. Acute PT will follow to progress mobility and stair training in preparation for safe discharge home.      Follow Up Recommendations Follow surgeon's recommendation for DC plan and follow-up therapies;Outpatient PT    Equipment Recommendations  Rolling walker with 5" wheels (youth)    Recommendations for Other Services       Precautions / Restrictions Precautions Precautions: Fall Restrictions Weight Bearing Restrictions: No Other Position/Activity Restrictions: WBAT      Mobility  Bed Mobility Overal bed mobility: Needs Assistance Bed Mobility: Supine to Sit     Supine to sit: Min assist;HOB elevated     General bed mobility comments: cues for use of bed rail and assist to bring trunk up fully.  Transfers Overall transfer level: Needs assistance Equipment used: Rolling walker (2 wheeled) Transfers: Sit to/from Stand Sit to Stand: Min assist         General transfer comment: cues for technique with RW and light assist to steady with power up.   Ambulation/Gait Ambulation/Gait assistance: Min assist Gait Distance (Feet): 50 Feet Assistive device: Rolling walker (2  wheeled) Gait Pattern/deviations: Step-to pattern;Decreased stride length;Decreased weight shift to left Gait velocity: decreased   General Gait Details: cues for safe step pattern and proximity to RW during gait. no overt LOB noted. Assist required throughout for walker management.  Stairs         Wheelchair Mobility    Modified Rankin (Stroke Patients Only)       Balance Overall balance assessment: Needs assistance Sitting-balance support: Feet supported Sitting balance-Leahy Scale: Good     Standing balance support: Bilateral upper extremity supported;During functional activity Standing balance-Leahy Scale: Fair                Pertinent Vitals/Pain Pain Assessment: 0-10 Pain Score: 6  Pain Location: Lt knee Pain Descriptors / Indicators: Aching;Discomfort Pain Intervention(s): Limited activity within patient's tolerance;Monitored during session;Repositioned    Home Living Family/patient expects to be discharged to:: Private residence Living Arrangements: Children Available Help at Discharge: Family Type of Home: House Home Access: Stairs to enter Entrance Stairs-Rails: Can reach both Entrance Stairs-Number of Steps: 3 Home Layout: Multi-level Home Equipment: Environmental consultant - 2 wheels;Cane - single point;Grab bars - tub/shower;Grab bars - toilet;Shower seat Additional Comments: pt's daughters will assist her for 2 weeks, lives with one daughter.    Prior Function Level of Independence: Independent   Gait / Transfers Assistance Needed: using SPC for mobility PTA           Hand Dominance   Dominant Hand: Right    Extremity/Trunk Assessment   Upper Extremity Assessment Upper Extremity Assessment: Overall WFL for tasks assessed    Lower Extremity Assessment Lower Extremity  Assessment: Overall WFL for tasks assessed;LLE deficits/detail LLE Deficits / Details: good quad activaiton, no extensor lag with SLR. LLE Sensation: WNL LLE Coordination: WNL     Cervical / Trunk Assessment Cervical / Trunk Assessment: Normal  Communication   Communication: No difficulties  Cognition Arousal/Alertness: Awake/alert Behavior During Therapy: WFL for tasks assessed/performed Overall Cognitive Status: Within Functional Limits for tasks assessed        General Comments: pt requiring increased time for processing and slightly lethargic and confused at start of session however improved throughout. pt's daughter present and report this is typical response to anesthesia for her.       General Comments      Exercises Total Joint Exercises Ankle Circles/Pumps: AROM;Both;20 reps;Seated Quad Sets: AROM;Left;10 reps;Seated Heel Slides: AROM;Left;5 reps;Seated   Assessment/Plan    PT Assessment Patient needs continued PT services  PT Problem List Decreased strength;Decreased range of motion;Decreased activity tolerance;Decreased balance;Decreased mobility;Decreased knowledge of use of DME;Pain       PT Treatment Interventions DME instruction;Gait training;Stair training;Functional mobility training;Therapeutic activities;Therapeutic exercise;Balance training;Patient/family education    PT Goals (Current goals can be found in the Care Plan section)  Acute Rehab PT Goals Patient Stated Goal: get home PT Goal Formulation: With patient Time For Goal Achievement: 01/12/20 Potential to Achieve Goals: Good    Frequency 7X/week    AM-PAC PT "6 Clicks" Mobility  Outcome Measure Help needed turning from your back to your side while in a flat bed without using bedrails?: A Little Help needed moving from lying on your back to sitting on the side of a flat bed without using bedrails?: A Little Help needed moving to and from a bed to a chair (including a wheelchair)?: A Little Help needed standing up from a chair using your arms (e.g., wheelchair or bedside chair)?: A Little Help needed to walk in hospital room?: A Little Help needed climbing 3-5 steps  with a railing? : A Lot 6 Click Score: 17    End of Session Equipment Utilized During Treatment: Gait belt Activity Tolerance: Patient tolerated treatment well     PT Visit Diagnosis: Muscle weakness (generalized) (M62.81);Difficulty in walking, not elsewhere classified (R26.2)    Time: 6213-0865 PT Time Calculation (min) (ACUTE ONLY): 33 min   Charges:   PT Evaluation $PT Eval Low Complexity: 1 Low PT Treatments $Gait Training: 8-22 mins        Verner Mould, DPT Acute Rehabilitation Services  Office [phone] Pager [phone]  01/05/2020 4:05 PM

## 2020-01-05 NOTE — Transfer of Care (Signed)
Immediate Anesthesia Transfer of Care Note  Patient: NIAH HEINLE  Procedure(s) Performed: TOTAL KNEE ARTHROPLASTY (Left Knee)  Patient Location: PACU  Anesthesia Type:General  Level of Consciousness: awake, alert  and oriented  Airway & Oxygen Therapy: Patient Spontanous Breathing and Patient connected to face mask oxygen  Post-op Assessment: Report given to RN, Post -op Vital signs reviewed and stable and Patient moving all extremities X 4  Post vital signs: Reviewed and stable  Last Vitals:  Vitals Value Taken Time  BP 159/93 01/05/20 0925  Temp    Pulse 88 01/05/20 0926  Resp 20 01/05/20 0926  SpO2 100 % 01/05/20 0926  Vitals shown include unvalidated device data.  Last Pain:  Vitals:   01/05/20 0630  TempSrc:   PainSc: 0-No pain         Complications: No complications documented.

## 2020-01-05 NOTE — Plan of Care (Signed)

## 2020-01-05 NOTE — Anesthesia Postprocedure Evaluation (Signed)
Anesthesia Post Note  Patient: Victoria [name]  Procedure(s) Performed: TOTAL KNEE ARTHROPLASTY (Left Knee)     Patient location during evaluation: PACU Anesthesia Type: Regional and General Level of consciousness: awake Pain management: pain level controlled Vital Signs Assessment: post-procedure vital signs reviewed and stable Respiratory status: spontaneous breathing, nonlabored ventilation, respiratory function stable and patient connected to nasal cannula oxygen Cardiovascular status: blood pressure returned to baseline and stable Postop Assessment: no apparent nausea or vomiting Anesthetic complications: no Comments: Sliding scale insulin ordered post-op.    No complications documented.  Last Vitals:  Vitals:   01/05/20 1051 01/05/20 1057  BP: (!) 181/94 (!) 177/77  Pulse: 78   Resp: 18   Temp: 37 C   SpO2: 96%     Last Pain:  Vitals:   01/05/20 1051  TempSrc: Oral  PainSc:                  Ronalda Walpole P Nasim Garofano

## 2020-01-05 NOTE — Anesthesia Procedure Notes (Signed)
Procedure Name: LMA Insertion Date/Time: 01/05/2020 7:22 AM Performed by: Niel Hummer, CRNA Pre-anesthesia Checklist: Patient identified, Emergency Drugs available, Suction available and Patient being monitored Patient Re-evaluated:Patient Re-evaluated prior to induction Oxygen Delivery Method: Circle system utilized Preoxygenation: Pre-oxygenation with 100% oxygen Induction Type: IV induction Ventilation: Mask ventilation without difficulty LMA: LMA inserted LMA Size: 4.0 Number of attempts: 1 Dental Injury: Teeth and Oropharynx as per pre-operative assessment

## 2020-01-05 NOTE — Op Note (Signed)
MRN:     [id number] DOB/AGE:    10/24/1955 / 64 y.o.       OPERATIVE REPORT   DATE OF PROCEDURE:  01/05/2020      PREOPERATIVE DIAGNOSIS:   Primary Localized Osteoarthritis left Knee       Estimated body mass index is 37.52 kg/m as calculated from the following:   Height as of this encounter: 5\' 1"  (1.549 m).   Weight as of this encounter: 90.1 kg.                                                       POSTOPERATIVE DIAGNOSIS:   Same                                                                 PROCEDURE:  Procedure(s): TOTAL KNEE ARTHROPLASTY Using Depuy Attune RP implants #4 Femur, #4Tibia, 25mm  RP bearing, 32 Patella    SURGEON: Bernie Fobes A. Noemi Chapel, MD   ASSISTANT: Matthew Saras, PA-C, present and scrubbed throughout the case, critical for retraction, instrumentation, and closure.  ANESTHESIA: GET with Adductor Nerve Block  TOURNIQUET TIME: 60 minutes   COMPLICATIONS:  None       SPECIMENS: None   INDICATIONS FOR PROCEDURE: The patient has djd of the knee with varus deformities, XR shows bone on bone arthritis. Patient has failed all conservative measures including anti-inflammatory medicines, narcotics, attempts at exercise and weight loss, cortisone injections and viscosupplementation.  Risks and benefits of surgery have been discussed, questions answered.    DESCRIPTION OF PROCEDURE: The patient identified by armband, received right adductor canal block and IV antibiotics, in the holding area at [facility]. Patient taken to the operating room, appropriate anesthetic monitors were attached. GET anesthesia induced with the patient in supine position, Foley catheter was inserted. Tourniquet applied high to the operative thigh. Lateral post and foot positioner applied to the table, the lower extremity was then prepped and draped in usual sterile fashion from the ankle to the tourniquet. Time-out procedure was performed. The limb was wrapped with an Esmarch bandage and the  tourniquet inflated to 365 mmHg.   We began the operation by making a 6cm anterior midline incision. Small bleeders in the skin and the subcutaneous tissue identified and cauterized. Transverse retinaculum was incised and reflected medially and a medial parapatellar arthrotomy was accomplished. the patella was everted and theprepatellar fat pad resected. The superficial medial collateral ligament was then elevated from anterior to posterior along the proximal flare of the tibia and anterior half of the menisci resected. The knee was hyperflexed exposing bone on bone arthritis. Peripheral and notch osteophytes as well as the cruciate ligaments were then resected. We continued to work our way around posteriorly along the proximal tibia, and externally rotated the tibia subluxing it out from underneath the femur. A McHale retractor was placed through the notch and a lateral Hohmann retractor placed, and an external tibial guide was placed.  The tibial cutting guide was pinned into place allowing resection of 4 mm of bone medially and about 6 mm of bone laterally because of her varus  deformity.   Satisfied with the tibial resection, we then entered the distal femur 2 mm anterior to the PCL origin with the intramedullary guide rod and applied the distal femoral cutting guide set at 51mm, with 5 degrees of valgus. This was pinned along the epicondylar axis. At this point, the distal femoral cut was accomplished without difficulty. We then sized for a 4 femoral component and pinned the guide in 3 degrees of external rotation.The chamfer cutting guide was pinned into place. The anterior, posterior, and chamfer cuts were accomplished without difficulty followed by the  RP box cutting guide and the box cut. We also removed posterior osteophytes from the posterior femoral condyles. At this time, the knee was brought into full extension. We checked our extension and flexion gaps and found them symmetric at 7.  The patella  thickness measured at 79m m. We set the cutting guide at 15 and removed the posterior patella sized for 32 button and drilled the lollipop. The knee was then once again hyperflexed exposing the proximal tibia. We sized for a # 4 tibial base plate, applied the smokestack and the conical reamer followed by the the Delta fin keel punch. We then hammered into place the  RP trial femoral component, inserted a trial bearing, trial patellar button, and took the knee through range of motion from 0-130 degrees. No thumb pressure was required for patellar tracking.   At this point, all trial components were removed, a double batch of DePuy HV cement  was mixed and applied to all bony metallic mating surfaces. In order, we hammered into place the tibial tray and removed excess cement, the femoral component and removed excess cement, a 7 mm  RP bearing was inserted, and the knee brought to full extension with compression. The patellar button was clamped into place, and excess cement removed. While the cement cured the wound was irrigated out with normal saline solution pulse lavage, and exparel was injected throughout the knee. Ligament stability and patellar tracking were checked and found to be excellent..   The parapatellar arthrotomy was closed with  #1 Vicryl suture. The subcutaneous tissue with 0 and 2-0 undyed Vicryl suture, and 4-0 Monocryl.. A dressing of Aquaseal, 4 x 4, dressing sponges, Webril, and Ace wrap applied. Needle and sponge count were correct times 2.The patient awakened, extubated, and taken to recovery room without difficulty. Vascular status was normal, pulses 2+ and symmetric.    Lorn Junes 09/17/2017, 8:56 AM

## 2020-01-05 NOTE — Anesthesia Procedure Notes (Signed)
Anesthesia Regional Block: Adductor canal block   Pre-Anesthetic Checklist: ,, timeout performed, Correct Patient, Correct Site, Correct Laterality, Correct Procedure,, site marked, risks and benefits discussed, Surgical consent,  Pre-op evaluation,  At surgeon's request and post-op pain management  Laterality: Left  Prep: chloraprep       Needles:  Injection technique: Single-shot  Needle Type: Echogenic Stimulator Needle     Needle Length: 10cm  Needle Gauge: 20     Additional Needles:   Procedures:,,,, ultrasound used (permanent image in chart),,,,  Narrative:  Start time: 01/05/2020 6:55 AM End time: 01/05/2020 7:05 AM Injection made incrementally with aspirations every 5 mL.  Performed by: Personally  Anesthesiologist: Murvin Natal, MD  Additional Notes: Functioning IV was confirmed and monitors were applied. A time-out was performed. Hand hygiene and sterile gloves were used. The thigh was placed in a frog-leg position and prepped in a sterile fashion. A 153mm 20ga BBraun echogenic stimulator needle was placed using ultrasound guidance.  Negative aspiration and negative test dose prior to incremental administration of local anesthetic. The patient tolerated the procedure well.

## 2020-01-06 ENCOUNTER — Encounter (HOSPITAL_COMMUNITY): Payer: Self-pay | Admitting: Orthopedic Surgery

## 2020-01-06 DIAGNOSIS — Z96652 Presence of left artificial knee joint: Secondary | ICD-10-CM | POA: Diagnosis not present

## 2020-01-06 DIAGNOSIS — M1712 Unilateral primary osteoarthritis, left knee: Secondary | ICD-10-CM | POA: Diagnosis not present

## 2020-01-06 LAB — BASIC METABOLIC PANEL
Anion gap: 10 (ref 5–15)
BUN: 17 mg/dL (ref 8–23)
CO2: 24 mmol/L (ref 22–32)
Calcium: 7.9 mg/dL — ABNORMAL LOW (ref 8.9–10.3)
Chloride: 102 mmol/L (ref 98–111)
Creatinine, Ser: 0.9 mg/dL (ref 0.44–1.00)
GFR calc Af Amer: >60 mL/min (ref >60)
GFR calc non Af Amer: >60 mL/min (ref >60)
Glucose, Bld: 288 mg/dL — ABNORMAL HIGH (ref 70–99)
Potassium: 4.4 mmol/L (ref 3.5–5.1)
Sodium: 136 mmol/L (ref 135–145)

## 2020-01-06 LAB — GLUCOSE, CAPILLARY: Glucose-Capillary: 356 mg/dL — ABNORMAL HIGH (ref 70–99)

## 2020-01-06 LAB — CBC
HCT: 32.3 % — ABNORMAL LOW (ref 36.0–46.0)
Hemoglobin: 10.7 g/dL — ABNORMAL LOW (ref 12.0–15.0)
MCH: 27.4 pg (ref 26.0–34.0)
MCHC: 33.1 g/dL (ref 30.0–36.0)
MCV: 82.6 fL (ref 80.0–100.0)
Platelets: 281 10*3/uL (ref 150–400)
RBC: 3.91 MIL/uL (ref 3.87–5.11)
RDW: 14.3 % (ref 11.5–15.5)
WBC: 13 10*3/uL — ABNORMAL HIGH (ref 4.0–10.5)
nRBC: 0 % (ref 0.0–0.2)

## 2020-01-06 MED ORDER — POLYETHYLENE GLYCOL 3350 17 G PO PACK: PACK | ORAL | 0 refills | Status: AC

## 2020-01-06 MED ORDER — OXYCODONE HCL 5 MG PO TABS: ORAL_TABLET | ORAL | 0 refills | Status: AC

## 2020-01-06 MED ORDER — ASPIRIN 325 MG PO TBEC: DELAYED_RELEASE_TABLET | ORAL | 0 refills | Status: AC

## 2020-01-06 MED ORDER — GABAPENTIN 300 MG PO CAPS: ORAL_CAPSULE | ORAL | 0 refills | Status: AC

## 2020-01-06 MED ORDER — SODIUM CHLORIDE 0.9 % IV BOLUS
500.0000 mL | Freq: Once | INTRAVENOUS | Status: AC
  Administered 2020-01-06: 500 mL via INTRAVENOUS

## 2020-01-06 MED ORDER — DOCUSATE SODIUM 100 MG PO CAPS: ORAL_CAPSULE | ORAL | 0 refills | Status: AC

## 2020-01-06 NOTE — TOC Transition Note (Signed)
Transition of Care [facility]) - CM/SW Discharge Note   Patient Details  Name: Victoria [name] MRN: [id number] Date of Birth: 1956-04-14  [facility]) CM/SW Contact:  Lia Hopping, [place] Phone Number: 01/06/2020, 10:37 AM   Clinical Narrative:    Peculiar of care.  DME provided Pre-Op.        Patient Goals and CMS Choice        Discharge Placement                       Discharge Plan and Services                DME Arranged: [facility] DME Agency: [place]                  Social Determinants of Health (SDOH) Interventions     Readmission Risk Interventions No flowsheet data found.

## 2020-01-06 NOTE — Progress Notes (Signed)
RN reviewed discharge instructions with patient and family. All questions answered.   Paperwork given. Prescriptions electronically sent to patient pharmacy.    NT rolled patient down with all belongings to family car.     Mckenze Slone, RN  

## 2020-01-06 NOTE — Progress Notes (Signed)
Inpatient Diabetes Program Recommendations  AACE/ADA: New Consensus Statement on Inpatient Glycemic Control (2015)  Target Ranges:  Prepandial:   less than 140 mg/dL      Peak postprandial:   less than 180 mg/dL (1-2 hours)      Critically ill patients:  140 - 180 mg/dL   Lab Results  Component Value Date   GLUCAP 356 (H) 01/06/2020   HGBA1C 6.7 (H) 12/24/2019    Review of Glycemic Control Results for Victoria [name], Victoria [name] (MRN [id number]) as of 01/06/2020 08:58  Ref. Range 01/05/2020 09:45 01/05/2020 11:55 01/05/2020 16:49 01/05/2020 21:52 01/06/2020 07:45  Glucose-Capillary Latest Ref Range: 70 - 99 mg/dL 296 (H) 287 (H) 312 (H) 341 (H) 356 (H)   Diabetes history: DM 2 Outpatient Diabetes medications: NPH 12 units 4x/day, Novolin Regular 14-23 units tid  Current orders for Inpatient glycemic control: Home regimen NPH 12 units 4x/day, Novolin Regular 14-23 units tid Novolog 0-15 units tid  Glucose 300's prior to surgery and Decadron A1c 6.7% on 6/30 down from 8% on 5/3  Inpatient Diabetes Program Recommendations:    Decadron 8 units given yesterday. Glucose 356 this am.  -  Consider increasing NPH to 14-16 units 4x/day.  -  Change Novolog Correction to 0-20 units Q4 hours.  Thanks,  Tama Headings RN, MSN, BC-ADM Inpatient Diabetes Coordinator Team Pager [phone] (8a-5p)

## 2020-01-06 NOTE — Progress Notes (Signed)
Patient was given home regimen Novolog and Novolin N scale per Dr. Buddy Duty.   Patient and daughter reported that Dr. Buddy Duty updated her insulin regimen 2 weeks ago.    She reports taking 7 units with each meal and at night of Novolog, and 12 units four times daily of the Novolin N.   I clarified with PA-C Kirstin Shepperson. She verified that we should follow patients home reigmen of insulin.    I documented accordingly in the [facility].     Benedetto Goad, RN

## 2020-01-06 NOTE — Progress Notes (Signed)
Physical Therapy Treatment Patient Details Name: Victoria [name] MRN: [id number] DOB: [birth year] Today's Date: 01/06/2020    History of Present Illness Patient is 64 y.o. female s/p Lt TKA on 01/05/20 with PMH significant for original Lt TKA on 10/21/19, DM, HTN, thyroid cancer, Rt TKA in 2019.    PT Comments    Progressing well with mobility. Reviewed/practiced exercises, gait training and stair training. All education completed. Okay to d/c from PT standpoint.    Follow Up Recommendations  Follow surgeon's recommendation for DC plan and follow-up therapies     Equipment Recommendations  Rolling walker with 5" wheels    Recommendations for Other Services       Precautions / Restrictions Precautions Precautions: Fall Restrictions Weight Bearing Restrictions: No Other Position/Activity Restrictions: WBAT    Mobility  Bed Mobility               General bed mobility comments: oob in recliner  Transfers Overall transfer level: Needs assistance Equipment used: Rolling walker (2 wheeled) Transfers: Sit to/from Stand Sit to Stand: Supervision         General transfer comment: VCs safety, hand placement.  Ambulation/Gait Ambulation/Gait assistance: Supervision Gait Distance (Feet): 115 Feet Assistive device: Rolling walker (2 wheeled) Gait Pattern/deviations: Step-through pattern;Decreased stride length         Stairs Stairs: Yes Stairs assistance: Min guard Stair Management: Forwards;Step to pattern;Two rails Number of Stairs: 5 General stair comments: up and over portable stairs x 2. VCs safety, sequence, technique   Wheelchair Mobility    Modified Rankin (Stroke Patients Only)       Balance Overall balance assessment: Mild deficits observed, not formally tested                                          Cognition Arousal/Alertness: Awake/alert Behavior During Therapy: WFL for tasks assessed/performed Overall Cognitive Status:  Within Functional Limits for tasks assessed                                        Exercises Total Joint Exercises Ankle Circles/Pumps: AROM;Both;10 reps Quad Sets: AROM;Both;10 reps Hip ABduction/ADduction: AROM;Left;10 reps Straight Leg Raises: AROM;Left;10 reps Knee Flexion: AROM;Left;10 reps;Seated Goniometric ROM: ~10-65 degrees    General Comments        Pertinent Vitals/Pain Pain Assessment: 0-10 Pain Score: 5  Pain Location: L knee Pain Descriptors / Indicators: Discomfort;Sore Pain Intervention(s): Monitored during session;Repositioned    Home Living                      Prior Function            PT Goals (current goals can now be found in the care plan section) Progress towards PT goals: Progressing toward goals    Frequency    7X/week      PT Plan Current plan remains appropriate    Co-evaluation              AM-PAC PT "6 Clicks" Mobility   Outcome Measure  Help needed turning from your back to your side while in a flat bed without using bedrails?: A Little Help needed moving from lying on your back to sitting on the side of a flat bed without using bedrails?: A Little Help needed moving  to and from a bed to a chair (including a wheelchair)?: A Little Help needed standing up from a chair using your arms (e.g., wheelchair or bedside chair)?: A Little Help needed to walk in hospital room?: A Little Help needed climbing 3-5 steps with a railing? : A Little 6 Click Score: 18    End of Session Equipment Utilized During Treatment: Gait belt Activity Tolerance: Patient tolerated treatment well Patient left: in chair;with call bell/phone within reach   PT Visit Diagnosis: Other abnormalities of gait and mobility (R26.89);Pain Pain - Right/Left: Left Pain - part of body: Knee     Time: 1040-1054 PT Time Calculation (min) (ACUTE ONLY): 14 min  Charges:  $Gait Training: 8-22 mins                         Doreatha Massed, PT Acute Rehabilitation  Office: [phone] Pager: [phone]

## 2020-01-07 DIAGNOSIS — R262 Difficulty in walking, not elsewhere classified: Secondary | ICD-10-CM | POA: Diagnosis not present

## 2020-01-07 DIAGNOSIS — M1712 Unilateral primary osteoarthritis, left knee: Secondary | ICD-10-CM | POA: Diagnosis not present

## 2020-01-07 DIAGNOSIS — M25662 Stiffness of left knee, not elsewhere classified: Secondary | ICD-10-CM | POA: Diagnosis not present

## 2020-01-07 NOTE — Discharge Summary (Signed)
Patient ID: Victoria [name] MRN: [id number] DOB/AGE: 09/03/1955 64 y.o.  Admit date: 01/05/2020 Discharge date: 01/06/2020  Admission Diagnoses:  Principal Problem:   Primary localized osteoarthritis of left knee Active Problems:   Benign essential HTN   Diabetic hyperosmolar non-ketotic state ([place])   DOE (dyspnea on exertion)   S/P total knee arthroplasty, right   Discharge Diagnoses:  Same  Past Medical History:  Diagnosis Date  . Benign essential HTN   . Cancer (HCC)    hx of thyroid cancer   . Carpal tunnel syndrome   . Diabetes mellitus without complication ([place])    type 1 - followed by Dr Buddy Duty   . Dyslipidemia   . Hypothyroidism   . Jaundice, unspecified, not of newborn    Scleral icterus since age 77  . Obesity, unspecified   . Other B-complex deficiencies   . Primary localized osteoarthritis of left knee 10/21/2019  . Primary localized osteoarthritis of right knee 11/14/2017  . S/P TKR (total knee replacement), left 10/21/2019  . Unspecified monoarthritis, forearm     Surgeries: Procedure(s): TOTAL KNEE ARTHROPLASTY on 01/05/2020   Consultants:   Discharged Condition: Improved  Hospital Course: Victoria [name] is an 64 y.o. female who was admitted 01/05/2020 for operative treatment ofPrimary localized osteoarthritis of left knee. Patient has severe unremitting pain that affects sleep, daily activities, and work/hobbies. After pre-op clearance the patient was taken to the operating room on 01/05/2020 and underwent  Procedure(s): TOTAL KNEE ARTHROPLASTY.    Patient was given perioperative antibiotics:  Anti-infectives (From admission, onward)   Start     Dose/Rate Route Frequency Ordered Stop   01/05/20 1400  ceFAZolin (ANCEF) IVPB 2g/100 mL premix        2 g 200 mL/hr over 30 Minutes Intravenous Every 6 hours 01/05/20 1049 01/05/20 2251   01/05/20 0600  ceFAZolin (ANCEF) IVPB 2g/100 mL premix        2 g 200 mL/hr over 30 Minutes Intravenous On call to O.R.  01/05/20 9390 01/05/20 3009       Patient was given sequential compression devices, early ambulation, and chemoprophylaxis to prevent DVT.  Patient benefited maximally from hospital stay and there were no complications.    Recent vital signs: No data found.   Recent laboratory studies:  Recent Labs    01/06/20 0312  WBC 13.0*  HGB 10.7*  HCT 32.3*  PLT 281  NA 136  K 4.4  CL 102  CO2 24  BUN 17  CREATININE 0.90  GLUCOSE 288*  CALCIUM 7.9*     Discharge Medications:   Allergies as of 01/06/2020   No Known Allergies     Medication List    STOP taking these medications   meloxicam 15 MG tablet Commonly known as: MOBIC   metoCLOPramide 5 MG tablet Commonly known as: REGLAN     TAKE these medications   aspirin 325 MG EC tablet 1 tab a day for the next 30 days to prevent blood clots What changed: Another medication with the same name was removed. Continue taking this medication, and follow the directions you see here.   aspirin-acetaminophen-caffeine 250-250-65 MG tablet Commonly known as: EXCEDRIN MIGRAINE Take 1 tablet by mouth 2 (two) times daily as needed for headache.   Calcium 600+D3 600-800 MG-UNIT Tabs Generic drug: Calcium Carb-Cholecalciferol Take 1 tablet by mouth every evening.   docusate sodium 100 MG capsule Commonly known as: COLACE 1 tab 2 times a day while on narcotics.  STOOL SOFTENER  ferrous sulfate 325 (65 FE) MG tablet Take 325 mg by mouth every evening.   gabapentin 300 MG capsule Commonly known as: NEURONTIN 1 po qhs for nerve pain   insulin NPH Human 100 UNIT/ML injection Commonly known as: NOVOLIN N Inject 12 Units into the skin 4 (four) times daily.   insulin regular 100 units/mL injection Commonly known as: NOVOLIN R Inject 11-20 Units into the skin See admin instructions. Sliding Scale: 11 to 20 units before meals - breakfast and evening; 7 to 16 units before lunch; Only up to 56 units per day   levothyroxine 100  MCG tablet Commonly known as: SYNTHROID Take 100 mcg by mouth every other day. Alternate with 112 What changed: Another medication with the same name was changed. Make sure you understand how and when to take each. Notes to patient: Home regimen   levothyroxine 112 MCG tablet Commonly known as: SYNTHROID TAKE 1 TABLET BY MOUTH EVERY OTHER DAY What changed:   when to take this  additional instructions   lisinopril-hydrochlorothiazide 10-12.5 MG tablet Commonly known as: ZESTORETIC Take 1 tablet by mouth daily. Notes to patient: Resume 01/07/20   metoprolol succinate 25 MG 24 hr tablet Commonly known as: TOPROL-XL TAKE 1 TABLET BY MOUTH EVERY DAY   oxyCODONE 5 MG immediate release tablet Commonly known as: Oxy IR/ROXICODONE 1 po q 4 hrs prn pain   polyethylene glycol 17 g packet Commonly known as: MIRALAX / GLYCOLAX 17grams in 6 oz of something to drink twice a day until bowel movement.  LAXITIVE.  Restart if two days since last bowel movement   simvastatin 20 MG tablet Commonly known as: ZOCOR Take 20 mg by mouth every evening.   tiZANidine 2 MG tablet Commonly known as: ZANAFLEX Take 2 mg by mouth at bedtime as needed for muscle spasms.   vitamin B-12 500 MCG tablet Commonly known as: CYANOCOBALAMIN Take 500 mcg by mouth every evening.            Discharge Care Instructions  (From admission, onward)         Start     Ordered   01/06/20 0000  Change dressing       Comments: DO NOT REMOVE BANDAGE OVER SURGICAL INCISION.  [place] WHOLE LEG INCLUDING OVER THE WATERPROOF BANDAGE WITH SOAP AND WATER EVERY DAY.   01/06/20 0747          Diagnostic Studies: No results found.  Disposition: Discharge disposition: 01-Home or Self Care       Discharge Instructions    CPM   Complete by: As directed    Continuous passive motion machine (CPM):      Use the CPM from 0 to 90 for 6 hours per day.       You may break it up into 2 or 3 sessions per day.      Use  CPM for 2 weeks or until you are told to stop.   Call MD / Call 911   Complete by: As directed    If you experience chest pain or shortness of breath, CALL 911 and be transported to the hospital emergency room.  If you develope a fever above 101 F, pus (white drainage) or increased drainage or redness at the wound, or calf pain, call your surgeon's office.   Change dressing   Complete by: As directed    DO NOT REMOVE BANDAGE OVER SURGICAL INCISION.  [place] WHOLE LEG INCLUDING OVER THE WATERPROOF BANDAGE WITH SOAP AND WATER EVERY  DAY.   Constipation Prevention   Complete by: As directed    Drink plenty of fluids.  Prune juice may be helpful.  You may use a stool softener, such as Colace (over the counter) 100 mg twice a day.  Use MiraLax (over the counter) for constipation as needed.   Diet - low sodium heart healthy   Complete by: As directed    Discharge instructions   Complete by: As directed    INSTRUCTIONS AFTER JOINT REPLACEMENT   HOLD LISINOPRIL/ HCTZ IF BLOOD PRESSURE LESS THAN 140/90 GET UP AND WALK EVERY HOUR BE VERY DILIGENT ABOUT MONITORING BLOOD GLUCOSE AND TREATING AGGRESSIVELY TO MAINTAIN GOOD CONTROL   Remove items at home which could result in a fall. This includes throw rugs or furniture in walking pathways  You may notice swelling that will progress down to the foot and ankle.  This is normal after surgery.  Elevate your leg when you are not up walking on it.    Continue to use the breathing machine you got in the hospital (incentive spirometer) which will help keep your temperature down.  It is common for your temperature to cycle up and down following surgery, especially at night when you are not up moving around and exerting yourself.  The breathing machine keeps your lungs expanded and your temperature down.   DIET:  As you were doing prior to hospitalization, we recommend a well-balanced diet.  DRESSING / WOUND CARE / SHOWERING  Keep the surgical dressing until  follow up.  The dressing is water proof, so you can shower without any extra covering.  IF THE DRESSING FALLS OFF or the wound gets wet inside, change the dressing with sterile gauze.  Please use good hand washing techniques before changing the dressing.  Do not use any lotions or creams on the incision until instructed by your surgeon.    ACTIVITY  Increase activity slowly as tolerated, but follow the weight bearing instructions below.   No driving for 6 weeks or until further direction given by your physician.  You cannot drive while taking narcotics.  No lifting or carrying greater than 10 lbs. until further directed by your surgeon. Avoid periods of inactivity such as sitting longer than an hour when not asleep. This helps prevent blood clots.  You may return to work once you are authorized by your doctor.     WEIGHT BEARING   Weight bearing as tolerated with assist device (walker, cane, etc) as directed, use it as long as suggested by your surgeon or therapist, typically at least 2-3 weeks.   EXERCISES  Results after joint replacement surgery are often greatly improved when you follow the exercise, range of motion and muscle strengthening exercises prescribed by your doctor. Safety measures are also important to protect the joint from further injury. Any time any of these exercises cause you to have increased pain or swelling, decrease what you are doing until you are comfortable again and then slowly increase them. If you have problems or questions, call your caregiver or physical therapist for advice.   Rehabilitation is important following a joint replacement. After just a few days of immobilization, the muscles of the leg can become weakened and shrink (atrophy).  These exercises are designed to build up the tone and strength of the thigh and leg muscles and to improve motion. Often times heat used for twenty to thirty minutes before working out will loosen up your tissues and help with  improving the range  of motion but do not use heat for the first two weeks following surgery (sometimes heat can increase post-operative swelling).   These exercises can be done on a training (exercise) mat, on the floor, on a table or on a bed. Use whatever works the best and is most comfortable for you.    Use music or television while you are exercising so that the exercises are a pleasant break in your day. This will make your life better with the exercises acting as a break in your routine that you can look forward to.   Perform all exercises about fifteen times, three times per day or as directed.  You should exercise both the operative leg and the other leg as well.   Exercises include:  Quad Sets - Tighten up the muscle on the front of the thigh (Quad) and hold for 5-10 seconds.   Straight Leg Raises - With your knee straight (if you were given a brace, keep it on), lift the leg to 60 degrees, hold for 3 seconds, and slowly lower the leg.  Perform this exercise against resistance later as your leg gets stronger.  Leg Slides: Lying on your back, slowly slide your foot toward your buttocks, bending your knee up off the floor (only go as far as is comfortable). Then slowly slide your foot back down until your leg is flat on the floor again.  Angel Wings: Lying on your back spread your legs to the side as far apart as you can without causing discomfort.  Hamstring Strength:  Lying on your back, push your heel against the floor with your leg straight by tightening up the muscles of your buttocks.  Repeat, but this time bend your knee to a comfortable angle, and push your heel against the floor.  You may put a pillow under the heel to make it more comfortable if necessary.   A rehabilitation program following joint replacement surgery can speed recovery and prevent re-injury in the future due to weakened muscles. Contact your doctor or a physical therapist for more information on knee rehabilitation.     CONSTIPATION  Constipation is defined medically as fewer than three stools per week and severe constipation as less than one stool per week.  Even if you have a regular bowel pattern at home, your normal regimen is likely to be disrupted due to multiple reasons following surgery.  Combination of anesthesia, postoperative narcotics, change in appetite and fluid intake all can affect your bowels.   YOU MUST use at least one of the following options; they are listed in order of increasing strength to get the job done.  They are all available over the counter, and you may need to use some, POSSIBLY even all of these options:    Drink plenty of fluids (prune juice may be helpful) and high fiber foods Colace 100 mg by mouth twice a day  Senokot for constipation as directed and as needed Dulcolax (bisacodyl), take with full glass of water  Miralax (polyethylene glycol) once or twice a day as needed.  If you have tried all these things and are unable to have a bowel movement in the first 3-4 days after surgery call either your surgeon or your primary doctor.    If you experience loose stools or diarrhea, hold the medications until you stool forms back up.  If your symptoms do not get better within 1 week or if they get worse, check with your doctor.  If you experience "the  worst abdominal pain ever" or develop nausea or vomiting, please contact the office immediately for further recommendations for treatment.   ITCHING:  If you experience itching with your medications, try taking only a single pain pill, or even half a pain pill at a time.  You can also use Benadryl over the counter for itching or also to help with sleep.   TED HOSE STOCKINGS:  Use stockings on both legs until for at least 2 weeks or as directed by physician office. They may be removed at night for sleeping.  MEDICATIONS:  See your medication summary on the "After Visit Summary" that nursing will review with you.  You may have some  home medications which will be placed on hold until you complete the course of blood thinner medication.  It is important for you to complete the blood thinner medication as prescribed.  PRECAUTIONS:  If you experience chest pain or shortness of breath - call 911 immediately for transfer to the hospital emergency department.   If you develop a fever greater that 101 F, purulent drainage from wound, increased redness or drainage from wound, foul odor from the wound/dressing, or calf pain - CONTACT YOUR SURGEON.                                                   FOLLOW-UP APPOINTMENTS:  If you do not already have a post-op appointment, please call the office for an appointment to be seen by your surgeon.  Guidelines for how soon to be seen are listed in your "After Visit Summary", but are typically between 1-4 weeks after surgery.  OTHER INSTRUCTIONS:   Knee Replacement:  Do not place pillow under knee, focus on keeping the knee straight while resting. CPM instructions: 0-90 degrees, 2 hours in the morning, 2 hours in the afternoon, and 2 hours in the evening. Place foam block, curve side up under heel at all times except when in CPM or when walking.  DO NOT modify, tear, cut, or change the foam block in any way.  MAKE SURE YOU:  Understand these instructions.  Get help right away if you are not doing well or get worse.    Thank you for letting us be a part of your medical care team.  It is a privilege we respect greatly.  We hope these instructions will help you stay on track for a fast and full recovery!   Do not put a pillow under the knee. Place it under the heel.   Complete by: As directed    Place gray foam block, curve side up under heel at all times except when in CPM or when walking.  DO NOT modify, tear, cut, or change in any way the gray foam block.   Increase activity slowly as tolerated   Complete by: As directed    Patient may shower   Complete by: As directed    Aquacel dressing  is water proof    Wash over it and the whole leg with soap and water at the end of your shower   TED hose   Complete by: As directed    Use stockings (TED hose) for 2 weeks on both leg(s).  You may remove them at night for sleeping.       Follow-up Information    Elsie Saas, MD. Go on  01/15/2020.   Specialty: Orthopedic Surgery Why: Your appointment has been scheduled for 11:15.  Contact information: [place] [address] [phone]        [facility], [place]. Go on 01/07/2020.   Why: You are scheduled to start Outpatient physical therapy at 10:00. Please arrive at 9:30 to complete your paperwork  Contact information: [facility] [place] [address] [phone]                Signed: Linda Hedges 01/07/2020, 7:34 PM

## 2020-01-08 DIAGNOSIS — M1712 Unilateral primary osteoarthritis, left knee: Secondary | ICD-10-CM | POA: Diagnosis not present

## 2020-01-08 DIAGNOSIS — M25662 Stiffness of left knee, not elsewhere classified: Secondary | ICD-10-CM | POA: Diagnosis not present

## 2020-01-08 DIAGNOSIS — R262 Difficulty in walking, not elsewhere classified: Secondary | ICD-10-CM | POA: Diagnosis not present

## 2020-01-12 DIAGNOSIS — M25662 Stiffness of left knee, not elsewhere classified: Secondary | ICD-10-CM | POA: Diagnosis not present

## 2020-01-12 DIAGNOSIS — R262 Difficulty in walking, not elsewhere classified: Secondary | ICD-10-CM | POA: Diagnosis not present

## 2020-01-12 DIAGNOSIS — M1712 Unilateral primary osteoarthritis, left knee: Secondary | ICD-10-CM | POA: Diagnosis not present

## 2020-01-14 DIAGNOSIS — M1712 Unilateral primary osteoarthritis, left knee: Secondary | ICD-10-CM | POA: Diagnosis not present

## 2020-01-15 DIAGNOSIS — M25662 Stiffness of left knee, not elsewhere classified: Secondary | ICD-10-CM | POA: Diagnosis not present

## 2020-01-15 DIAGNOSIS — R262 Difficulty in walking, not elsewhere classified: Secondary | ICD-10-CM | POA: Diagnosis not present

## 2020-01-15 DIAGNOSIS — M1712 Unilateral primary osteoarthritis, left knee: Secondary | ICD-10-CM | POA: Diagnosis not present

## 2020-01-19 DIAGNOSIS — M1712 Unilateral primary osteoarthritis, left knee: Secondary | ICD-10-CM | POA: Diagnosis not present

## 2020-01-19 DIAGNOSIS — M25662 Stiffness of left knee, not elsewhere classified: Secondary | ICD-10-CM | POA: Diagnosis not present

## 2020-01-19 DIAGNOSIS — R262 Difficulty in walking, not elsewhere classified: Secondary | ICD-10-CM | POA: Diagnosis not present

## 2020-01-22 DIAGNOSIS — M1712 Unilateral primary osteoarthritis, left knee: Secondary | ICD-10-CM | POA: Diagnosis not present

## 2020-01-24 DIAGNOSIS — M1712 Unilateral primary osteoarthritis, left knee: Secondary | ICD-10-CM | POA: Diagnosis not present

## 2020-01-26 DIAGNOSIS — M1712 Unilateral primary osteoarthritis, left knee: Secondary | ICD-10-CM | POA: Diagnosis not present

## 2020-01-26 DIAGNOSIS — R262 Difficulty in walking, not elsewhere classified: Secondary | ICD-10-CM | POA: Diagnosis not present

## 2020-01-26 DIAGNOSIS — M25662 Stiffness of left knee, not elsewhere classified: Secondary | ICD-10-CM | POA: Diagnosis not present

## 2020-01-27 NOTE — Discharge Instructions (Signed)
INSTRUCTIONS AFTER JOINT REPLACEMENT  ° °o Remove items at home which could result in a fall. This includes throw rugs or furniture in walking pathways °o You may notice swelling that will progress down to the foot and ankle.  This is normal after surgery.  Elevate your leg when you are not up walking on it.   °o Continue to use the breathing machine you got in the hospital (incentive spirometer) which will help keep your temperature down.  It is common for your temperature to cycle up and down following surgery, especially at night when you are not up moving around and exerting yourself.  The breathing machine keeps your lungs expanded and your temperature down. ° ° °DIET:  As you were doing prior to hospitalization, we recommend a well-balanced diet. ° °DRESSING / WOUND CARE / SHOWERING ° °Keep the surgical dressing until follow up.  The dressing is water proof, so you can shower without any extra covering.  IF THE DRESSING FALLS OFF or the wound gets wet inside, change the dressing with sterile gauze.  Please use good hand washing techniques before changing the dressing.  Do not use any lotions or creams on the incision until instructed by your surgeon.   ° °ACTIVITY ° °o Increase activity slowly as tolerated, but follow the weight bearing instructions below.   °o No driving for 6 weeks or until further direction given by your physician.  You cannot drive while taking narcotics.  °o No lifting or carrying greater than 10 lbs. until further directed by your surgeon. °o Avoid periods of inactivity such as sitting longer than an hour when not asleep. This helps prevent blood clots.  °o You may return to work once you are authorized by your doctor.  ° ° ° °WEIGHT BEARING  ° °Weight bearing as tolerated with assist device (walker, cane, etc) as directed, use it as long as suggested by your surgeon or therapist, typically at least 4-6 weeks. ° ° °EXERCISES ° °Results after joint replacement surgery are often greatly  improved when you follow the exercise, range of motion and muscle strengthening exercises prescribed by your doctor. Safety measures are also important to protect the joint from further injury. Any time any of these exercises cause you to have increased pain or swelling, decrease what you are doing until you are comfortable again and then slowly increase them. If you have problems or questions, call your caregiver or physical therapist for advice.  ° °Rehabilitation is important following a joint replacement. After just a few days of immobilization, the muscles of the leg can become weakened and shrink (atrophy).  These exercises are designed to build up the tone and strength of the thigh and leg muscles and to improve motion. Often times heat used for twenty to thirty minutes before working out will loosen up your tissues and help with improving the range of motion but do not use heat for the first two weeks following surgery (sometimes heat can increase post-operative swelling).  ° °These exercises can be done on a training (exercise) mat, on the floor, on a table or on a bed. Use whatever works the best and is most comfortable for you.    Use music or television while you are exercising so that the exercises are a pleasant break in your day. This will make your life better with the exercises acting as a break in your routine that you can look forward to.   Perform all exercises about fifteen times, three   times per day or as directed.  You should exercise both the operative leg and the other leg as well. ° °Exercises include: °  °• Quad Sets - Tighten up the muscle on the front of the thigh (Quad) and hold for 5-10 seconds.   °• Straight Leg Raises - With your knee straight (if you were given a brace, keep it on), lift the leg to 60 degrees, hold for 3 seconds, and slowly lower the leg.  Perform this exercise against resistance later as your leg gets stronger.  °• Leg Slides: Lying on your back, slowly slide your  foot toward your buttocks, bending your knee up off the floor (only go as far as is comfortable). Then slowly slide your foot back down until your leg is flat on the floor again.  °• Angel Wings: Lying on your back spread your legs to the side as far apart as you can without causing discomfort.  °• Hamstring Strength:  Lying on your back, push your heel against the floor with your leg straight by tightening up the muscles of your buttocks.  Repeat, but this time bend your knee to a comfortable angle, and push your heel against the floor.  You may put a pillow under the heel to make it more comfortable if necessary.  ° °A rehabilitation program following joint replacement surgery can speed recovery and prevent re-injury in the future due to weakened muscles. Contact your doctor or a physical therapist for more information on knee rehabilitation.  ° ° °CONSTIPATION ° °Constipation is defined medically as fewer than three stools per week and severe constipation as less than one stool per week.  Even if you have a regular bowel pattern at home, your normal regimen is likely to be disrupted due to multiple reasons following surgery.  Combination of anesthesia, postoperative narcotics, change in appetite and fluid intake all can affect your bowels.  ° °YOU MUST use at least one of the following options; they are listed in order of increasing strength to get the job done.  They are all available over the counter, and you may need to use some, POSSIBLY even all of these options:   ° °Drink plenty of fluids (prune juice may be helpful) and high fiber foods °Colace 100 mg by mouth twice a day  °Senokot for constipation as directed and as needed Dulcolax (bisacodyl), take with full glass of water  °Miralax (polyethylene glycol) once or twice a day as needed. ° °If you have tried all these things and are unable to have a bowel movement in the first 3-4 days after surgery call either your surgeon or your primary doctor.   ° °If  you experience loose stools or diarrhea, hold the medications until you stool forms back up.  If your symptoms do not get better within 1 week or if they get worse, check with your doctor.  If you experience "the worst abdominal pain ever" or develop nausea or vomiting, please contact the office immediately for further recommendations for treatment. ° ° °ITCHING:  If you experience itching with your medications, try taking only a single pain pill, or even half a pain pill at a time.  You can also use Benadryl over the counter for itching or also to help with sleep.  ° °TED HOSE STOCKINGS:  Use stockings on both legs until for at least 2 weeks or as directed by physician office. They may be removed at night for sleeping. ° °MEDICATIONS:  See your medication   summary on the “After Visit Summary” that nursing will review with you.  You may have some home medications which will be placed on hold until you complete the course of blood thinner medication.  It is important for you to complete the blood thinner medication as prescribed. ° °PRECAUTIONS:  If you experience chest pain or shortness of breath - call 911 immediately for transfer to the hospital emergency department.  ° °If you develop a fever greater that 101 F, purulent drainage from wound, increased redness or drainage from wound, foul odor from the wound/dressing, or calf pain - CONTACT YOUR SURGEON.   °                                                °FOLLOW-UP APPOINTMENTS:  If you do not already have a post-op appointment, please call the office for an appointment to be seen by your surgeon.  Guidelines for how soon to be seen are listed in your “After Visit Summary”, but are typically between 1-4 weeks after surgery. ° °OTHER INSTRUCTIONS:  ° °Knee Replacement:  Do not place pillow under knee, focus on keeping the knee straight while resting. CPM instructions: 0-90 degrees, 2 hours in the morning, 2 hours in the afternoon, and 2 hours in the evening. Place  foam block, curve side up under heel at all times except when in CPM or when walking.  DO NOT modify, tear, cut, or change the foam block in any way. ° ° °DENTAL ANTIBIOTICS: ° °In most cases prophylactic antibiotics for Dental procdeures after total joint surgery are not necessary. ° °Exceptions are as follows: ° °1. History of prior total joint infection ° °2. Severely immunocompromised (Organ Transplant, cancer chemotherapy, Rheumatoid biologic °meds such as Humera) ° °3. Poorly controlled diabetes (A1C &gt; 8.0, blood glucose over 200) ° °If you have one of these conditions, contact your surgeon for an antibiotic prescription, prior to your °dental procedure. ° ° °MAKE SURE YOU:  °• Understand these instructions.  °• Get help right away if you are not doing well or get worse.  ° ° °Thank you for letting us be a part of your medical care team.  It is a privilege we respect greatly.  We hope these instructions will help you stay on track for a fast and full recovery!  ° °

## 2020-01-29 DIAGNOSIS — R262 Difficulty in walking, not elsewhere classified: Secondary | ICD-10-CM | POA: Diagnosis not present

## 2020-01-29 DIAGNOSIS — M1712 Unilateral primary osteoarthritis, left knee: Secondary | ICD-10-CM | POA: Diagnosis not present

## 2020-01-29 DIAGNOSIS — M25662 Stiffness of left knee, not elsewhere classified: Secondary | ICD-10-CM | POA: Diagnosis not present

## 2020-02-02 DIAGNOSIS — M1712 Unilateral primary osteoarthritis, left knee: Secondary | ICD-10-CM | POA: Diagnosis not present

## 2020-02-02 DIAGNOSIS — M25662 Stiffness of left knee, not elsewhere classified: Secondary | ICD-10-CM | POA: Diagnosis not present

## 2020-02-02 DIAGNOSIS — R262 Difficulty in walking, not elsewhere classified: Secondary | ICD-10-CM | POA: Diagnosis not present

## 2020-02-05 DIAGNOSIS — M25662 Stiffness of left knee, not elsewhere classified: Secondary | ICD-10-CM | POA: Diagnosis not present

## 2020-02-05 DIAGNOSIS — R262 Difficulty in walking, not elsewhere classified: Secondary | ICD-10-CM | POA: Diagnosis not present

## 2020-02-05 DIAGNOSIS — M1712 Unilateral primary osteoarthritis, left knee: Secondary | ICD-10-CM | POA: Diagnosis not present

## 2020-02-12 DIAGNOSIS — M25662 Stiffness of left knee, not elsewhere classified: Secondary | ICD-10-CM | POA: Diagnosis not present

## 2020-02-12 DIAGNOSIS — M1712 Unilateral primary osteoarthritis, left knee: Secondary | ICD-10-CM | POA: Diagnosis not present

## 2020-02-12 DIAGNOSIS — R262 Difficulty in walking, not elsewhere classified: Secondary | ICD-10-CM | POA: Diagnosis not present

## 2020-02-16 DIAGNOSIS — R262 Difficulty in walking, not elsewhere classified: Secondary | ICD-10-CM | POA: Diagnosis not present

## 2020-02-16 DIAGNOSIS — M25662 Stiffness of left knee, not elsewhere classified: Secondary | ICD-10-CM | POA: Diagnosis not present

## 2020-02-16 DIAGNOSIS — M1712 Unilateral primary osteoarthritis, left knee: Secondary | ICD-10-CM | POA: Diagnosis not present

## 2020-02-19 DIAGNOSIS — R262 Difficulty in walking, not elsewhere classified: Secondary | ICD-10-CM | POA: Diagnosis not present

## 2020-02-19 DIAGNOSIS — M1712 Unilateral primary osteoarthritis, left knee: Secondary | ICD-10-CM | POA: Diagnosis not present

## 2020-02-19 DIAGNOSIS — M25662 Stiffness of left knee, not elsewhere classified: Secondary | ICD-10-CM | POA: Diagnosis not present

## 2020-02-23 DIAGNOSIS — M1712 Unilateral primary osteoarthritis, left knee: Secondary | ICD-10-CM | POA: Diagnosis not present

## 2020-02-23 DIAGNOSIS — R262 Difficulty in walking, not elsewhere classified: Secondary | ICD-10-CM | POA: Diagnosis not present

## 2020-02-23 DIAGNOSIS — M25662 Stiffness of left knee, not elsewhere classified: Secondary | ICD-10-CM | POA: Diagnosis not present

## 2020-03-17 DIAGNOSIS — E039 Hypothyroidism, unspecified: Secondary | ICD-10-CM | POA: Diagnosis not present

## 2020-03-17 DIAGNOSIS — E109 Type 1 diabetes mellitus without complications: Secondary | ICD-10-CM | POA: Diagnosis not present

## 2020-03-17 DIAGNOSIS — I10 Essential (primary) hypertension: Secondary | ICD-10-CM | POA: Diagnosis not present

## 2020-03-17 DIAGNOSIS — E063 Autoimmune thyroiditis: Secondary | ICD-10-CM | POA: Diagnosis not present

## 2020-03-17 DIAGNOSIS — Z794 Long term (current) use of insulin: Secondary | ICD-10-CM | POA: Diagnosis not present

## 2020-03-26 DIAGNOSIS — Z23 Encounter for immunization: Secondary | ICD-10-CM | POA: Diagnosis not present

## 2020-04-12 DIAGNOSIS — M1712 Unilateral primary osteoarthritis, left knee: Secondary | ICD-10-CM | POA: Diagnosis not present

## 2020-04-20 ENCOUNTER — Other Ambulatory Visit: Payer: Self-pay | Admitting: Family Medicine

## 2020-04-20 DIAGNOSIS — Z1231 Encounter for screening mammogram for malignant neoplasm of breast: Secondary | ICD-10-CM

## 2020-05-26 ENCOUNTER — Other Ambulatory Visit: Payer: Self-pay

## 2020-05-26 ENCOUNTER — Ambulatory Visit
Admission: RE | Admit: 2020-05-26 | Discharge: 2020-05-26 | Disposition: A | Discharge status: Home / Self Care | Payer: Medicare HMO | Source: Ambulatory Visit | Attending: Family Medicine | Admitting: Family Medicine

## 2020-05-26 DIAGNOSIS — Z1231 Encounter for screening mammogram for malignant neoplasm of breast: Secondary | ICD-10-CM

## 2020-06-09 DIAGNOSIS — D510 Vitamin B12 deficiency anemia due to intrinsic factor deficiency: Secondary | ICD-10-CM | POA: Diagnosis not present

## 2020-06-09 DIAGNOSIS — E108 Type 1 diabetes mellitus with unspecified complications: Secondary | ICD-10-CM | POA: Diagnosis not present

## 2020-06-09 DIAGNOSIS — Z23 Encounter for immunization: Secondary | ICD-10-CM | POA: Diagnosis not present

## 2020-06-09 DIAGNOSIS — Z0000 Encounter for general adult medical examination without abnormal findings: Secondary | ICD-10-CM | POA: Diagnosis not present

## 2020-06-09 DIAGNOSIS — E559 Vitamin D deficiency, unspecified: Secondary | ICD-10-CM | POA: Diagnosis not present

## 2020-06-09 DIAGNOSIS — E785 Hyperlipidemia, unspecified: Secondary | ICD-10-CM | POA: Diagnosis not present

## 2020-06-09 DIAGNOSIS — E538 Deficiency of other specified B group vitamins: Secondary | ICD-10-CM | POA: Diagnosis not present

## 2020-06-09 DIAGNOSIS — I10 Essential (primary) hypertension: Secondary | ICD-10-CM | POA: Diagnosis not present

## 2020-06-09 DIAGNOSIS — E039 Hypothyroidism, unspecified: Secondary | ICD-10-CM | POA: Diagnosis not present

## 2020-06-15 DIAGNOSIS — I10 Essential (primary) hypertension: Secondary | ICD-10-CM | POA: Diagnosis not present

## 2020-06-15 DIAGNOSIS — E039 Hypothyroidism, unspecified: Secondary | ICD-10-CM | POA: Diagnosis not present

## 2020-06-15 DIAGNOSIS — Z794 Long term (current) use of insulin: Secondary | ICD-10-CM | POA: Diagnosis not present

## 2020-06-15 DIAGNOSIS — E063 Autoimmune thyroiditis: Secondary | ICD-10-CM | POA: Diagnosis not present

## 2020-06-15 DIAGNOSIS — E1065 Type 1 diabetes mellitus with hyperglycemia: Secondary | ICD-10-CM | POA: Diagnosis not present

## 2020-06-22 ENCOUNTER — Other Ambulatory Visit: Payer: Self-pay | Admitting: Family Medicine

## 2020-06-22 DIAGNOSIS — E2839 Other primary ovarian failure: Secondary | ICD-10-CM

## 2020-07-21 DIAGNOSIS — E1065 Type 1 diabetes mellitus with hyperglycemia: Secondary | ICD-10-CM | POA: Diagnosis not present

## 2020-07-21 DIAGNOSIS — E039 Hypothyroidism, unspecified: Secondary | ICD-10-CM | POA: Diagnosis not present

## 2020-07-21 DIAGNOSIS — I10 Essential (primary) hypertension: Secondary | ICD-10-CM | POA: Diagnosis not present

## 2020-07-21 DIAGNOSIS — Z794 Long term (current) use of insulin: Secondary | ICD-10-CM | POA: Diagnosis not present

## 2020-07-21 DIAGNOSIS — E063 Autoimmune thyroiditis: Secondary | ICD-10-CM | POA: Diagnosis not present

## 2020-08-23 DIAGNOSIS — I10 Essential (primary) hypertension: Secondary | ICD-10-CM | POA: Diagnosis not present

## 2020-08-23 DIAGNOSIS — E1065 Type 1 diabetes mellitus with hyperglycemia: Secondary | ICD-10-CM | POA: Diagnosis not present

## 2020-08-23 DIAGNOSIS — E063 Autoimmune thyroiditis: Secondary | ICD-10-CM | POA: Diagnosis not present

## 2020-08-23 DIAGNOSIS — Z794 Long term (current) use of insulin: Secondary | ICD-10-CM | POA: Diagnosis not present

## 2020-08-23 DIAGNOSIS — E039 Hypothyroidism, unspecified: Secondary | ICD-10-CM | POA: Diagnosis not present

## 2020-09-29 DIAGNOSIS — E119 Type 2 diabetes mellitus without complications: Secondary | ICD-10-CM | POA: Diagnosis not present

## 2020-09-29 DIAGNOSIS — H35372 Puckering of macula, left eye: Secondary | ICD-10-CM | POA: Diagnosis not present

## 2020-09-29 DIAGNOSIS — H35033 Hypertensive retinopathy, bilateral: Secondary | ICD-10-CM | POA: Diagnosis not present

## 2020-09-29 DIAGNOSIS — H11151 Pinguecula, right eye: Secondary | ICD-10-CM | POA: Diagnosis not present

## 2020-09-29 DIAGNOSIS — H04123 Dry eye syndrome of bilateral lacrimal glands: Secondary | ICD-10-CM | POA: Diagnosis not present

## 2020-10-21 DIAGNOSIS — E063 Autoimmune thyroiditis: Secondary | ICD-10-CM | POA: Diagnosis not present

## 2020-10-21 DIAGNOSIS — I10 Essential (primary) hypertension: Secondary | ICD-10-CM | POA: Diagnosis not present

## 2020-10-21 DIAGNOSIS — E039 Hypothyroidism, unspecified: Secondary | ICD-10-CM | POA: Diagnosis not present

## 2020-10-21 DIAGNOSIS — Z794 Long term (current) use of insulin: Secondary | ICD-10-CM | POA: Diagnosis not present

## 2020-10-21 DIAGNOSIS — E1065 Type 1 diabetes mellitus with hyperglycemia: Secondary | ICD-10-CM | POA: Diagnosis not present

## 2020-11-23 DIAGNOSIS — L821 Other seborrheic keratosis: Secondary | ICD-10-CM | POA: Diagnosis not present

## 2020-11-23 DIAGNOSIS — L80 Vitiligo: Secondary | ICD-10-CM | POA: Diagnosis not present

## 2020-11-23 DIAGNOSIS — L570 Actinic keratosis: Secondary | ICD-10-CM | POA: Diagnosis not present

## 2020-12-21 DIAGNOSIS — I10 Essential (primary) hypertension: Secondary | ICD-10-CM | POA: Diagnosis not present

## 2020-12-21 DIAGNOSIS — Z794 Long term (current) use of insulin: Secondary | ICD-10-CM | POA: Diagnosis not present

## 2020-12-21 DIAGNOSIS — E039 Hypothyroidism, unspecified: Secondary | ICD-10-CM | POA: Diagnosis not present

## 2020-12-21 DIAGNOSIS — E1065 Type 1 diabetes mellitus with hyperglycemia: Secondary | ICD-10-CM | POA: Diagnosis not present

## 2020-12-21 DIAGNOSIS — E063 Autoimmune thyroiditis: Secondary | ICD-10-CM | POA: Diagnosis not present

## 2020-12-23 DIAGNOSIS — N289 Disorder of kidney and ureter, unspecified: Secondary | ICD-10-CM | POA: Diagnosis not present

## 2020-12-23 DIAGNOSIS — I10 Essential (primary) hypertension: Secondary | ICD-10-CM | POA: Diagnosis not present

## 2020-12-23 DIAGNOSIS — E785 Hyperlipidemia, unspecified: Secondary | ICD-10-CM | POA: Diagnosis not present

## 2020-12-23 DIAGNOSIS — Z23 Encounter for immunization: Secondary | ICD-10-CM | POA: Diagnosis not present

## 2021-01-31 DIAGNOSIS — E039 Hypothyroidism, unspecified: Secondary | ICD-10-CM | POA: Diagnosis not present

## 2021-01-31 DIAGNOSIS — E063 Autoimmune thyroiditis: Secondary | ICD-10-CM | POA: Diagnosis not present

## 2021-01-31 DIAGNOSIS — Z794 Long term (current) use of insulin: Secondary | ICD-10-CM | POA: Diagnosis not present

## 2021-01-31 DIAGNOSIS — I10 Essential (primary) hypertension: Secondary | ICD-10-CM | POA: Diagnosis not present

## 2021-01-31 DIAGNOSIS — E1065 Type 1 diabetes mellitus with hyperglycemia: Secondary | ICD-10-CM | POA: Diagnosis not present

## 2021-04-13 DIAGNOSIS — E039 Hypothyroidism, unspecified: Secondary | ICD-10-CM | POA: Diagnosis not present

## 2021-04-13 DIAGNOSIS — Z794 Long term (current) use of insulin: Secondary | ICD-10-CM | POA: Diagnosis not present

## 2021-04-13 DIAGNOSIS — E1065 Type 1 diabetes mellitus with hyperglycemia: Secondary | ICD-10-CM | POA: Diagnosis not present

## 2021-04-13 DIAGNOSIS — E063 Autoimmune thyroiditis: Secondary | ICD-10-CM | POA: Diagnosis not present

## 2021-04-13 DIAGNOSIS — Z23 Encounter for immunization: Secondary | ICD-10-CM | POA: Diagnosis not present

## 2021-04-13 DIAGNOSIS — I10 Essential (primary) hypertension: Secondary | ICD-10-CM | POA: Diagnosis not present

## 2021-04-25 DIAGNOSIS — L570 Actinic keratosis: Secondary | ICD-10-CM | POA: Diagnosis not present

## 2021-05-13 ENCOUNTER — Other Ambulatory Visit: Payer: Self-pay | Admitting: Family Medicine

## 2021-05-13 DIAGNOSIS — Z1231 Encounter for screening mammogram for malignant neoplasm of breast: Secondary | ICD-10-CM

## 2021-06-22 ENCOUNTER — Ambulatory Visit
Admission: RE | Admit: 2021-06-22 | Discharge: 2021-06-22 | Disposition: A | Discharge status: Home / Self Care | Payer: Medicare HMO | Source: Ambulatory Visit | Attending: Family Medicine | Admitting: Family Medicine

## 2021-06-22 DIAGNOSIS — Z1231 Encounter for screening mammogram for malignant neoplasm of breast: Secondary | ICD-10-CM | POA: Diagnosis not present

## 2021-07-06 DIAGNOSIS — Z794 Long term (current) use of insulin: Secondary | ICD-10-CM | POA: Diagnosis not present

## 2021-07-06 DIAGNOSIS — E785 Hyperlipidemia, unspecified: Secondary | ICD-10-CM | POA: Diagnosis not present

## 2021-07-06 DIAGNOSIS — E559 Vitamin D deficiency, unspecified: Secondary | ICD-10-CM | POA: Diagnosis not present

## 2021-07-06 DIAGNOSIS — E063 Autoimmune thyroiditis: Secondary | ICD-10-CM | POA: Diagnosis not present

## 2021-07-06 DIAGNOSIS — E538 Deficiency of other specified B group vitamins: Secondary | ICD-10-CM | POA: Diagnosis not present

## 2021-07-06 DIAGNOSIS — E1065 Type 1 diabetes mellitus with hyperglycemia: Secondary | ICD-10-CM | POA: Diagnosis not present

## 2021-07-06 DIAGNOSIS — I10 Essential (primary) hypertension: Secondary | ICD-10-CM | POA: Diagnosis not present

## 2021-07-06 DIAGNOSIS — Z0000 Encounter for general adult medical examination without abnormal findings: Secondary | ICD-10-CM | POA: Diagnosis not present

## 2021-07-06 DIAGNOSIS — M5136 Other intervertebral disc degeneration, lumbar region: Secondary | ICD-10-CM | POA: Diagnosis not present

## 2021-07-06 DIAGNOSIS — E039 Hypothyroidism, unspecified: Secondary | ICD-10-CM | POA: Diagnosis not present

## 2021-07-26 DIAGNOSIS — I10 Essential (primary) hypertension: Secondary | ICD-10-CM | POA: Diagnosis not present

## 2021-07-26 DIAGNOSIS — E063 Autoimmune thyroiditis: Secondary | ICD-10-CM | POA: Diagnosis not present

## 2021-07-26 DIAGNOSIS — Z794 Long term (current) use of insulin: Secondary | ICD-10-CM | POA: Diagnosis not present

## 2021-07-26 DIAGNOSIS — E1065 Type 1 diabetes mellitus with hyperglycemia: Secondary | ICD-10-CM | POA: Diagnosis not present

## 2021-07-26 DIAGNOSIS — E039 Hypothyroidism, unspecified: Secondary | ICD-10-CM | POA: Diagnosis not present

## 2021-10-05 DIAGNOSIS — H35033 Hypertensive retinopathy, bilateral: Secondary | ICD-10-CM | POA: Diagnosis not present

## 2021-10-05 DIAGNOSIS — H04123 Dry eye syndrome of bilateral lacrimal glands: Secondary | ICD-10-CM | POA: Diagnosis not present

## 2021-10-05 DIAGNOSIS — H25813 Combined forms of age-related cataract, bilateral: Secondary | ICD-10-CM | POA: Diagnosis not present

## 2021-10-05 DIAGNOSIS — H35372 Puckering of macula, left eye: Secondary | ICD-10-CM | POA: Diagnosis not present

## 2021-10-05 DIAGNOSIS — E119 Type 2 diabetes mellitus without complications: Secondary | ICD-10-CM | POA: Diagnosis not present

## 2021-10-21 DIAGNOSIS — E1065 Type 1 diabetes mellitus with hyperglycemia: Secondary | ICD-10-CM | POA: Diagnosis not present

## 2021-10-21 DIAGNOSIS — E039 Hypothyroidism, unspecified: Secondary | ICD-10-CM | POA: Diagnosis not present

## 2021-10-21 DIAGNOSIS — Z794 Long term (current) use of insulin: Secondary | ICD-10-CM | POA: Diagnosis not present

## 2021-10-21 DIAGNOSIS — E063 Autoimmune thyroiditis: Secondary | ICD-10-CM | POA: Diagnosis not present

## 2021-10-21 DIAGNOSIS — I10 Essential (primary) hypertension: Secondary | ICD-10-CM | POA: Diagnosis not present

## 2022-01-11 DIAGNOSIS — E039 Hypothyroidism, unspecified: Secondary | ICD-10-CM | POA: Diagnosis not present

## 2022-01-11 DIAGNOSIS — E785 Hyperlipidemia, unspecified: Secondary | ICD-10-CM | POA: Diagnosis not present

## 2022-01-11 DIAGNOSIS — I10 Essential (primary) hypertension: Secondary | ICD-10-CM | POA: Diagnosis not present

## 2022-01-11 DIAGNOSIS — E1065 Type 1 diabetes mellitus with hyperglycemia: Secondary | ICD-10-CM | POA: Diagnosis not present

## 2022-01-13 DIAGNOSIS — E1065 Type 1 diabetes mellitus with hyperglycemia: Secondary | ICD-10-CM | POA: Diagnosis not present

## 2022-01-20 DIAGNOSIS — Z794 Long term (current) use of insulin: Secondary | ICD-10-CM | POA: Diagnosis not present

## 2022-01-20 DIAGNOSIS — E039 Hypothyroidism, unspecified: Secondary | ICD-10-CM | POA: Diagnosis not present

## 2022-01-20 DIAGNOSIS — E1065 Type 1 diabetes mellitus with hyperglycemia: Secondary | ICD-10-CM | POA: Diagnosis not present

## 2022-01-20 DIAGNOSIS — I10 Essential (primary) hypertension: Secondary | ICD-10-CM | POA: Diagnosis not present

## 2022-01-20 DIAGNOSIS — E063 Autoimmune thyroiditis: Secondary | ICD-10-CM | POA: Diagnosis not present

## 2022-03-15 ENCOUNTER — Other Ambulatory Visit: Payer: Self-pay | Admitting: Family Medicine

## 2022-03-15 DIAGNOSIS — E2839 Other primary ovarian failure: Secondary | ICD-10-CM

## 2022-04-21 DIAGNOSIS — E063 Autoimmune thyroiditis: Secondary | ICD-10-CM | POA: Diagnosis not present

## 2022-04-21 DIAGNOSIS — E1042 Type 1 diabetes mellitus with diabetic polyneuropathy: Secondary | ICD-10-CM | POA: Diagnosis not present

## 2022-04-21 DIAGNOSIS — I10 Essential (primary) hypertension: Secondary | ICD-10-CM | POA: Diagnosis not present

## 2022-04-21 DIAGNOSIS — Z23 Encounter for immunization: Secondary | ICD-10-CM | POA: Diagnosis not present

## 2022-04-21 DIAGNOSIS — Z794 Long term (current) use of insulin: Secondary | ICD-10-CM | POA: Diagnosis not present

## 2022-04-21 DIAGNOSIS — E039 Hypothyroidism, unspecified: Secondary | ICD-10-CM | POA: Diagnosis not present

## 2022-04-21 DIAGNOSIS — E1065 Type 1 diabetes mellitus with hyperglycemia: Secondary | ICD-10-CM | POA: Diagnosis not present

## 2022-04-27 ENCOUNTER — Other Ambulatory Visit: Payer: Self-pay | Admitting: Family Medicine

## 2022-04-27 DIAGNOSIS — Z1231 Encounter for screening mammogram for malignant neoplasm of breast: Secondary | ICD-10-CM

## 2022-06-27 ENCOUNTER — Ambulatory Visit
Admission: RE | Admit: 2022-06-27 | Discharge: 2022-06-27 | Disposition: A | Discharge status: Home / Self Care | Payer: Medicare HMO | Source: Ambulatory Visit | Attending: Family Medicine | Admitting: Family Medicine

## 2022-06-27 DIAGNOSIS — Z1231 Encounter for screening mammogram for malignant neoplasm of breast: Secondary | ICD-10-CM | POA: Diagnosis not present

## 2022-07-24 DIAGNOSIS — I10 Essential (primary) hypertension: Secondary | ICD-10-CM | POA: Diagnosis not present

## 2022-07-24 DIAGNOSIS — E1042 Type 1 diabetes mellitus with diabetic polyneuropathy: Secondary | ICD-10-CM | POA: Diagnosis not present

## 2022-07-24 DIAGNOSIS — E039 Hypothyroidism, unspecified: Secondary | ICD-10-CM | POA: Diagnosis not present

## 2022-07-24 DIAGNOSIS — E1065 Type 1 diabetes mellitus with hyperglycemia: Secondary | ICD-10-CM | POA: Diagnosis not present

## 2022-07-24 DIAGNOSIS — Z794 Long term (current) use of insulin: Secondary | ICD-10-CM | POA: Diagnosis not present

## 2022-07-24 DIAGNOSIS — E063 Autoimmune thyroiditis: Secondary | ICD-10-CM | POA: Diagnosis not present

## 2022-08-21 DIAGNOSIS — Z0000 Encounter for general adult medical examination without abnormal findings: Secondary | ICD-10-CM | POA: Diagnosis not present

## 2022-08-21 DIAGNOSIS — E063 Autoimmune thyroiditis: Secondary | ICD-10-CM | POA: Diagnosis not present

## 2022-08-21 DIAGNOSIS — E538 Deficiency of other specified B group vitamins: Secondary | ICD-10-CM | POA: Diagnosis not present

## 2022-08-21 DIAGNOSIS — M2550 Pain in unspecified joint: Secondary | ICD-10-CM | POA: Diagnosis not present

## 2022-08-21 DIAGNOSIS — E559 Vitamin D deficiency, unspecified: Secondary | ICD-10-CM | POA: Diagnosis not present

## 2022-08-21 DIAGNOSIS — E108 Type 1 diabetes mellitus with unspecified complications: Secondary | ICD-10-CM | POA: Diagnosis not present

## 2022-08-21 DIAGNOSIS — I10 Essential (primary) hypertension: Secondary | ICD-10-CM | POA: Diagnosis not present

## 2022-08-21 DIAGNOSIS — Z794 Long term (current) use of insulin: Secondary | ICD-10-CM | POA: Diagnosis not present

## 2022-08-21 DIAGNOSIS — E039 Hypothyroidism, unspecified: Secondary | ICD-10-CM | POA: Diagnosis not present

## 2022-08-21 DIAGNOSIS — E785 Hyperlipidemia, unspecified: Secondary | ICD-10-CM | POA: Diagnosis not present

## 2022-08-21 DIAGNOSIS — D509 Iron deficiency anemia, unspecified: Secondary | ICD-10-CM | POA: Diagnosis not present

## 2022-08-21 DIAGNOSIS — E876 Hypokalemia: Secondary | ICD-10-CM | POA: Diagnosis not present

## 2022-10-09 DIAGNOSIS — H35372 Puckering of macula, left eye: Secondary | ICD-10-CM | POA: Diagnosis not present

## 2022-10-09 DIAGNOSIS — E119 Type 2 diabetes mellitus without complications: Secondary | ICD-10-CM | POA: Diagnosis not present

## 2022-10-09 DIAGNOSIS — H35033 Hypertensive retinopathy, bilateral: Secondary | ICD-10-CM | POA: Diagnosis not present

## 2022-10-09 DIAGNOSIS — H11153 Pinguecula, bilateral: Secondary | ICD-10-CM | POA: Diagnosis not present

## 2022-10-09 DIAGNOSIS — H25813 Combined forms of age-related cataract, bilateral: Secondary | ICD-10-CM | POA: Diagnosis not present

## 2022-10-23 DIAGNOSIS — E063 Autoimmune thyroiditis: Secondary | ICD-10-CM | POA: Diagnosis not present

## 2022-10-23 DIAGNOSIS — E1042 Type 1 diabetes mellitus with diabetic polyneuropathy: Secondary | ICD-10-CM | POA: Diagnosis not present

## 2022-10-23 DIAGNOSIS — I10 Essential (primary) hypertension: Secondary | ICD-10-CM | POA: Diagnosis not present

## 2022-10-23 DIAGNOSIS — E1065 Type 1 diabetes mellitus with hyperglycemia: Secondary | ICD-10-CM | POA: Diagnosis not present

## 2022-10-23 DIAGNOSIS — E039 Hypothyroidism, unspecified: Secondary | ICD-10-CM | POA: Diagnosis not present

## 2022-10-23 DIAGNOSIS — Z794 Long term (current) use of insulin: Secondary | ICD-10-CM | POA: Diagnosis not present

## 2023-01-14 IMAGING — MG MM DIGITAL SCREENING BILAT W/ TOMO AND CAD
8 series · 8 of 24 positions shown · non-contrast
Comparison: Previous exam(s).

CLINICAL DATA: Screening.

EXAM:
DIGITAL SCREENING BILATERAL MAMMOGRAM WITH TOMOSYNTHESIS AND CAD
TECHNIQUE: Bilateral screening digital craniocaudal and mediolateral oblique
mammograms were obtained. Bilateral screening digital breast
tomosynthesis was performed. The images were evaluated with
computer-aided detection.

[R CC synth-2D]
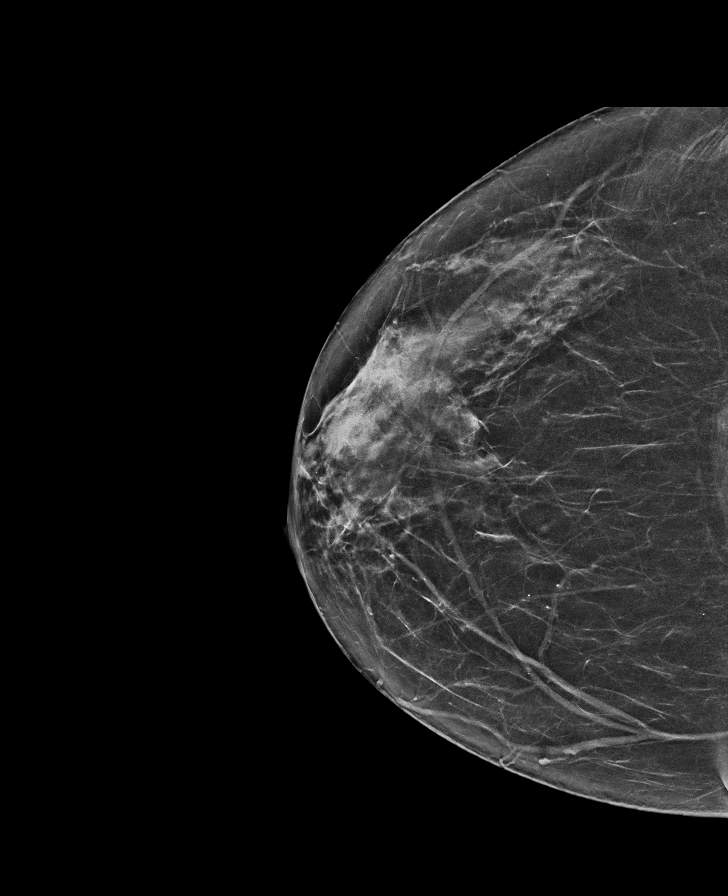

[L CC synth-2D]
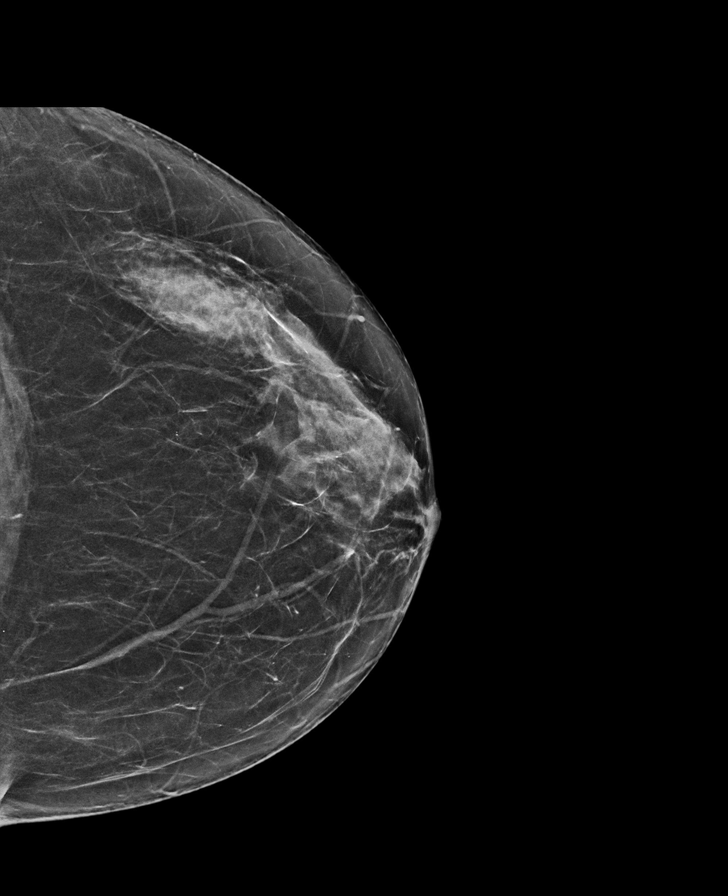

[L MLO synth-2D]
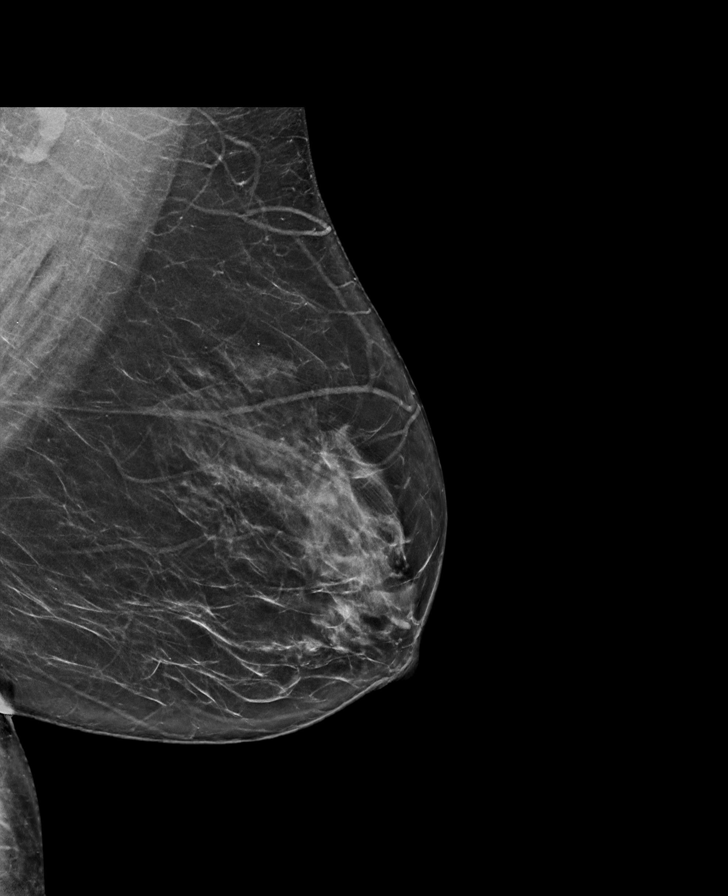

[R MLO synth-2D]
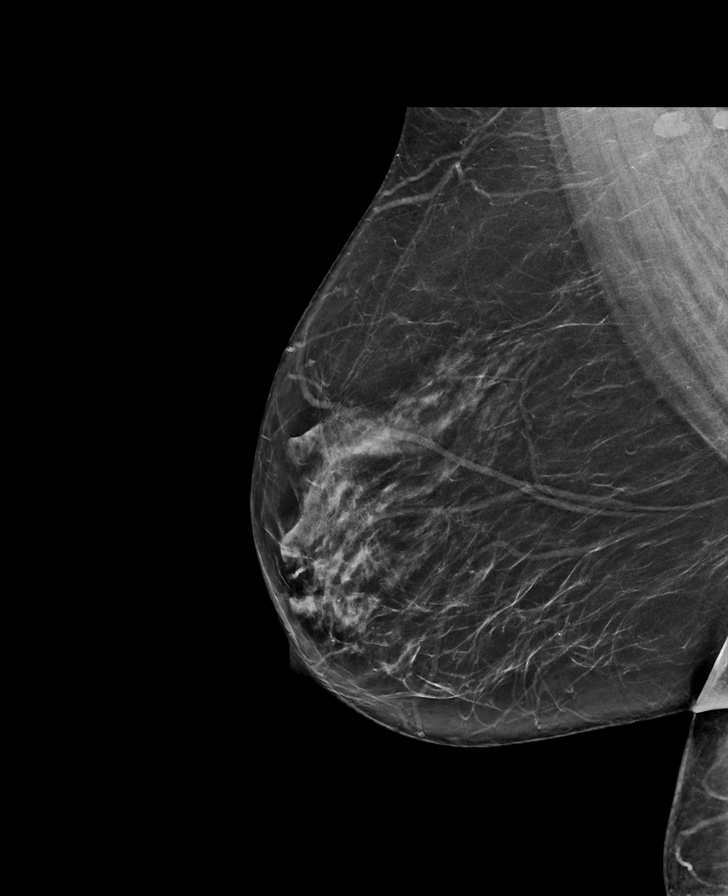

[L MLO tomo · tomo slice 37/72.0]
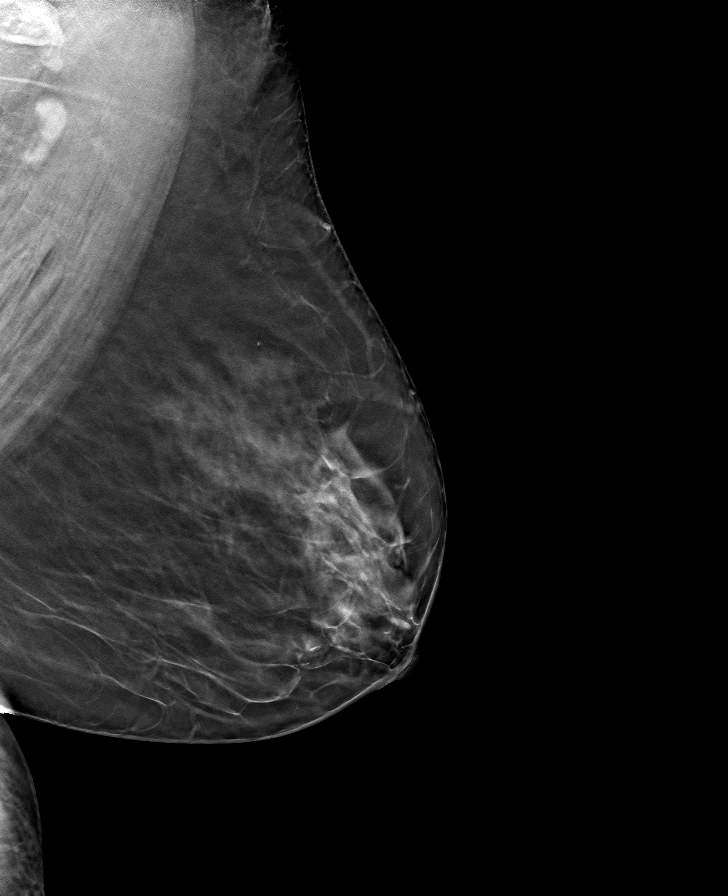

[R MLO tomo · tomo slice 37/73.0]
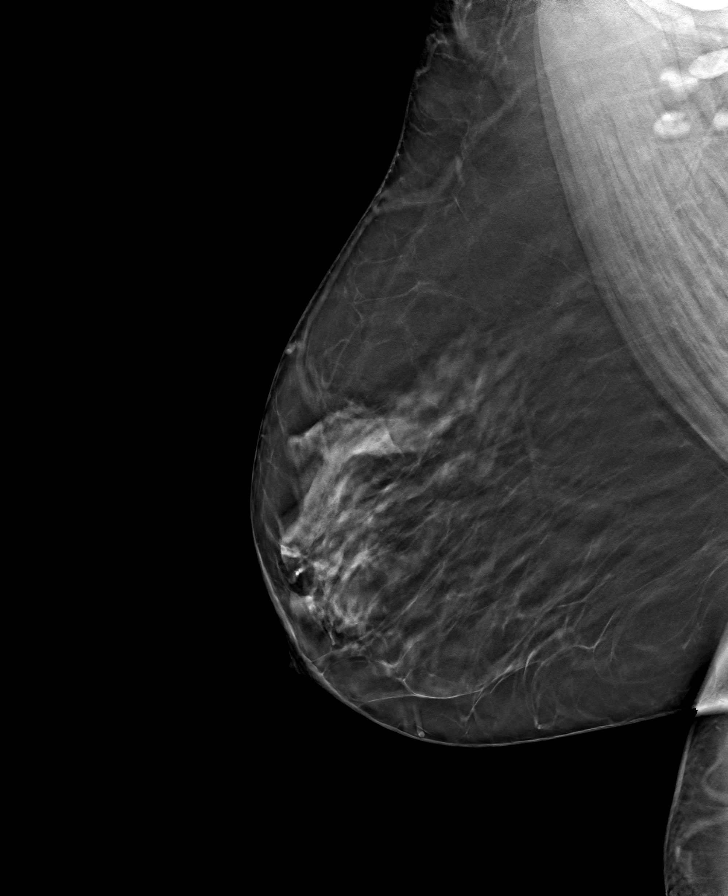

[R CC tomo · tomo slice 33/65.0]
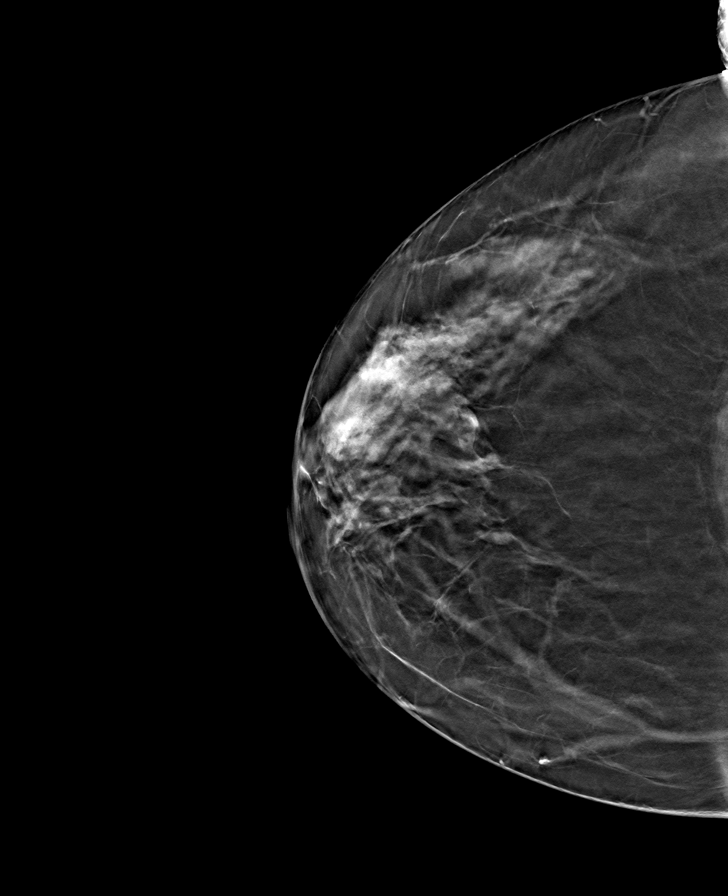

[L CC tomo · tomo slice 33/64.0]
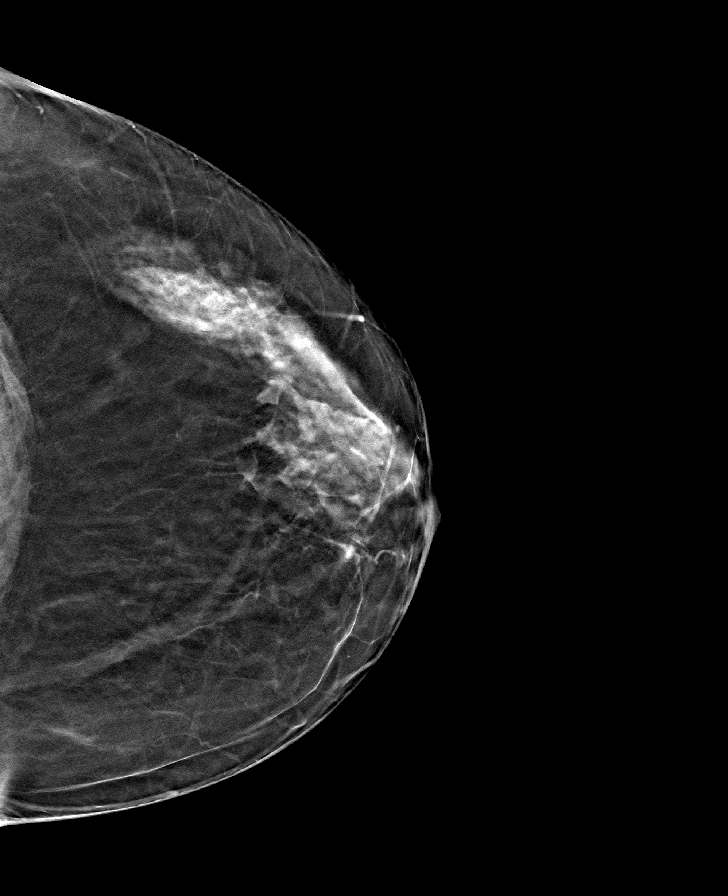

[8 of 24 positions shown; findings below may reference images not displayed]

ACR Breast Density Category b: There are scattered areas of
fibroglandular density.
FINDINGS: There are no findings suspicious for malignancy.
IMPRESSION: No mammographic evidence of malignancy. A result letter of this
screening mammogram will be mailed directly to the patient.

RECOMMENDATION:
Screening mammogram in one year. (Code:51-O-LD2)

BI-RADS CATEGORY  1: Negative.

## 2023-01-22 DIAGNOSIS — Z794 Long term (current) use of insulin: Secondary | ICD-10-CM | POA: Diagnosis not present

## 2023-01-22 DIAGNOSIS — E1042 Type 1 diabetes mellitus with diabetic polyneuropathy: Secondary | ICD-10-CM | POA: Diagnosis not present

## 2023-01-22 DIAGNOSIS — I10 Essential (primary) hypertension: Secondary | ICD-10-CM | POA: Diagnosis not present

## 2023-01-22 DIAGNOSIS — E039 Hypothyroidism, unspecified: Secondary | ICD-10-CM | POA: Diagnosis not present

## 2023-01-22 DIAGNOSIS — E1065 Type 1 diabetes mellitus with hyperglycemia: Secondary | ICD-10-CM | POA: Diagnosis not present

## 2023-01-22 DIAGNOSIS — E063 Autoimmune thyroiditis: Secondary | ICD-10-CM | POA: Diagnosis not present

## 2023-03-05 DIAGNOSIS — E063 Autoimmune thyroiditis: Secondary | ICD-10-CM | POA: Diagnosis not present

## 2023-03-05 DIAGNOSIS — M5136 Other intervertebral disc degeneration, lumbar region: Secondary | ICD-10-CM | POA: Diagnosis not present

## 2023-03-05 DIAGNOSIS — I10 Essential (primary) hypertension: Secondary | ICD-10-CM | POA: Diagnosis not present

## 2023-03-05 DIAGNOSIS — E785 Hyperlipidemia, unspecified: Secondary | ICD-10-CM | POA: Diagnosis not present

## 2023-03-05 DIAGNOSIS — Z23 Encounter for immunization: Secondary | ICD-10-CM | POA: Diagnosis not present

## 2023-03-05 DIAGNOSIS — E039 Hypothyroidism, unspecified: Secondary | ICD-10-CM | POA: Diagnosis not present

## 2023-03-05 DIAGNOSIS — E1042 Type 1 diabetes mellitus with diabetic polyneuropathy: Secondary | ICD-10-CM | POA: Diagnosis not present

## 2023-03-05 DIAGNOSIS — H6122 Impacted cerumen, left ear: Secondary | ICD-10-CM | POA: Diagnosis not present

## 2023-04-09 ENCOUNTER — Encounter (HOSPITAL_COMMUNITY): Payer: Self-pay | Admitting: Family Medicine

## 2023-04-09 DIAGNOSIS — Z1231 Encounter for screening mammogram for malignant neoplasm of breast: Secondary | ICD-10-CM

## 2023-04-26 DIAGNOSIS — E063 Autoimmune thyroiditis: Secondary | ICD-10-CM | POA: Diagnosis not present

## 2023-04-26 DIAGNOSIS — Z794 Long term (current) use of insulin: Secondary | ICD-10-CM | POA: Diagnosis not present

## 2023-04-26 DIAGNOSIS — I10 Essential (primary) hypertension: Secondary | ICD-10-CM | POA: Diagnosis not present

## 2023-04-26 DIAGNOSIS — E039 Hypothyroidism, unspecified: Secondary | ICD-10-CM | POA: Diagnosis not present

## 2023-04-26 DIAGNOSIS — E1065 Type 1 diabetes mellitus with hyperglycemia: Secondary | ICD-10-CM | POA: Diagnosis not present

## 2023-04-26 DIAGNOSIS — E1042 Type 1 diabetes mellitus with diabetic polyneuropathy: Secondary | ICD-10-CM | POA: Diagnosis not present

## 2023-06-04 DIAGNOSIS — E1065 Type 1 diabetes mellitus with hyperglycemia: Secondary | ICD-10-CM | POA: Diagnosis not present

## 2023-06-04 DIAGNOSIS — Z794 Long term (current) use of insulin: Secondary | ICD-10-CM | POA: Diagnosis not present

## 2023-06-04 DIAGNOSIS — E063 Autoimmune thyroiditis: Secondary | ICD-10-CM | POA: Diagnosis not present

## 2023-06-04 DIAGNOSIS — I10 Essential (primary) hypertension: Secondary | ICD-10-CM | POA: Diagnosis not present

## 2023-06-04 DIAGNOSIS — E1042 Type 1 diabetes mellitus with diabetic polyneuropathy: Secondary | ICD-10-CM | POA: Diagnosis not present

## 2023-06-04 DIAGNOSIS — E039 Hypothyroidism, unspecified: Secondary | ICD-10-CM | POA: Diagnosis not present

## 2023-07-24 ENCOUNTER — Other Ambulatory Visit: Payer: Self-pay | Admitting: Family Medicine

## 2023-07-24 DIAGNOSIS — Z1231 Encounter for screening mammogram for malignant neoplasm of breast: Secondary | ICD-10-CM

## 2023-08-08 ENCOUNTER — Ambulatory Visit
Admission: RE | Admit: 2023-08-08 | Discharge: 2023-08-08 | Disposition: A | Discharge status: Home / Self Care | Payer: Medicare HMO | Source: Ambulatory Visit | Attending: Family Medicine | Admitting: Family Medicine

## 2023-08-08 DIAGNOSIS — Z1231 Encounter for screening mammogram for malignant neoplasm of breast: Secondary | ICD-10-CM | POA: Diagnosis not present

## 2023-08-13 DIAGNOSIS — Z794 Long term (current) use of insulin: Secondary | ICD-10-CM | POA: Diagnosis not present

## 2023-08-13 DIAGNOSIS — E1065 Type 1 diabetes mellitus with hyperglycemia: Secondary | ICD-10-CM | POA: Diagnosis not present

## 2023-08-13 DIAGNOSIS — E1042 Type 1 diabetes mellitus with diabetic polyneuropathy: Secondary | ICD-10-CM | POA: Diagnosis not present

## 2023-08-13 DIAGNOSIS — E039 Hypothyroidism, unspecified: Secondary | ICD-10-CM | POA: Diagnosis not present

## 2023-08-13 DIAGNOSIS — I10 Essential (primary) hypertension: Secondary | ICD-10-CM | POA: Diagnosis not present

## 2023-08-13 DIAGNOSIS — E063 Autoimmune thyroiditis: Secondary | ICD-10-CM | POA: Diagnosis not present

## 2023-08-27 DIAGNOSIS — L820 Inflamed seborrheic keratosis: Secondary | ICD-10-CM | POA: Diagnosis not present

## 2023-08-27 DIAGNOSIS — L430 Hypertrophic lichen planus: Secondary | ICD-10-CM | POA: Diagnosis not present

## 2023-08-27 DIAGNOSIS — D485 Neoplasm of uncertain behavior of skin: Secondary | ICD-10-CM | POA: Diagnosis not present

## 2023-08-27 DIAGNOSIS — L821 Other seborrheic keratosis: Secondary | ICD-10-CM | POA: Diagnosis not present

## 2023-08-31 ENCOUNTER — Other Ambulatory Visit: Payer: Self-pay | Admitting: Family Medicine

## 2023-08-31 DIAGNOSIS — Z0000 Encounter for general adult medical examination without abnormal findings: Secondary | ICD-10-CM | POA: Diagnosis not present

## 2023-08-31 DIAGNOSIS — I10 Essential (primary) hypertension: Secondary | ICD-10-CM | POA: Diagnosis not present

## 2023-08-31 DIAGNOSIS — E1042 Type 1 diabetes mellitus with diabetic polyneuropathy: Secondary | ICD-10-CM | POA: Diagnosis not present

## 2023-08-31 DIAGNOSIS — Z794 Long term (current) use of insulin: Secondary | ICD-10-CM | POA: Diagnosis not present

## 2023-08-31 DIAGNOSIS — E538 Deficiency of other specified B group vitamins: Secondary | ICD-10-CM | POA: Diagnosis not present

## 2023-08-31 DIAGNOSIS — E785 Hyperlipidemia, unspecified: Secondary | ICD-10-CM | POA: Diagnosis not present

## 2023-08-31 DIAGNOSIS — E559 Vitamin D deficiency, unspecified: Secondary | ICD-10-CM | POA: Diagnosis not present

## 2023-08-31 DIAGNOSIS — Z124 Encounter for screening for malignant neoplasm of cervix: Secondary | ICD-10-CM | POA: Diagnosis not present

## 2023-08-31 DIAGNOSIS — E2839 Other primary ovarian failure: Secondary | ICD-10-CM

## 2023-08-31 DIAGNOSIS — E063 Autoimmune thyroiditis: Secondary | ICD-10-CM | POA: Diagnosis not present

## 2023-08-31 DIAGNOSIS — E108 Type 1 diabetes mellitus with unspecified complications: Secondary | ICD-10-CM | POA: Diagnosis not present

## 2023-08-31 DIAGNOSIS — E039 Hypothyroidism, unspecified: Secondary | ICD-10-CM | POA: Diagnosis not present

## 2023-09-03 DIAGNOSIS — E1042 Type 1 diabetes mellitus with diabetic polyneuropathy: Secondary | ICD-10-CM | POA: Diagnosis not present

## 2023-09-03 DIAGNOSIS — E063 Autoimmune thyroiditis: Secondary | ICD-10-CM | POA: Diagnosis not present

## 2023-09-03 DIAGNOSIS — Z794 Long term (current) use of insulin: Secondary | ICD-10-CM | POA: Diagnosis not present

## 2023-09-03 DIAGNOSIS — E039 Hypothyroidism, unspecified: Secondary | ICD-10-CM | POA: Diagnosis not present

## 2023-09-03 DIAGNOSIS — I10 Essential (primary) hypertension: Secondary | ICD-10-CM | POA: Diagnosis not present

## 2023-09-03 DIAGNOSIS — E1065 Type 1 diabetes mellitus with hyperglycemia: Secondary | ICD-10-CM | POA: Diagnosis not present

## 2023-10-30 DIAGNOSIS — Z794 Long term (current) use of insulin: Secondary | ICD-10-CM | POA: Diagnosis not present

## 2023-10-30 DIAGNOSIS — E1042 Type 1 diabetes mellitus with diabetic polyneuropathy: Secondary | ICD-10-CM | POA: Diagnosis not present

## 2023-10-30 DIAGNOSIS — E063 Autoimmune thyroiditis: Secondary | ICD-10-CM | POA: Diagnosis not present

## 2023-10-30 DIAGNOSIS — E039 Hypothyroidism, unspecified: Secondary | ICD-10-CM | POA: Diagnosis not present

## 2023-10-30 DIAGNOSIS — E1065 Type 1 diabetes mellitus with hyperglycemia: Secondary | ICD-10-CM | POA: Diagnosis not present

## 2023-10-30 DIAGNOSIS — I10 Essential (primary) hypertension: Secondary | ICD-10-CM | POA: Diagnosis not present

## 2023-11-06 ENCOUNTER — Ambulatory Visit
Admission: RE | Admit: 2023-11-06 | Discharge: 2023-11-06 | Disposition: A | Discharge status: Home / Self Care | Source: Ambulatory Visit | Attending: Family Medicine | Admitting: Family Medicine

## 2023-11-06 DIAGNOSIS — M8588 Other specified disorders of bone density and structure, other site: Secondary | ICD-10-CM | POA: Diagnosis not present

## 2023-11-06 DIAGNOSIS — E2839 Other primary ovarian failure: Secondary | ICD-10-CM | POA: Diagnosis not present

## 2023-11-06 DIAGNOSIS — N958 Other specified menopausal and perimenopausal disorders: Secondary | ICD-10-CM | POA: Diagnosis not present

## 2023-12-03 DIAGNOSIS — E1065 Type 1 diabetes mellitus with hyperglycemia: Secondary | ICD-10-CM | POA: Diagnosis not present

## 2024-02-21 DIAGNOSIS — H04123 Dry eye syndrome of bilateral lacrimal glands: Secondary | ICD-10-CM | POA: Diagnosis not present

## 2024-02-21 DIAGNOSIS — H35372 Puckering of macula, left eye: Secondary | ICD-10-CM | POA: Diagnosis not present

## 2024-02-21 DIAGNOSIS — H25813 Combined forms of age-related cataract, bilateral: Secondary | ICD-10-CM | POA: Diagnosis not present

## 2024-02-21 DIAGNOSIS — E119 Type 2 diabetes mellitus without complications: Secondary | ICD-10-CM | POA: Diagnosis not present

## 2024-02-29 DIAGNOSIS — E1042 Type 1 diabetes mellitus with diabetic polyneuropathy: Secondary | ICD-10-CM | POA: Diagnosis not present

## 2024-02-29 DIAGNOSIS — Z794 Long term (current) use of insulin: Secondary | ICD-10-CM | POA: Diagnosis not present

## 2024-02-29 DIAGNOSIS — E1065 Type 1 diabetes mellitus with hyperglycemia: Secondary | ICD-10-CM | POA: Diagnosis not present

## 2024-02-29 DIAGNOSIS — I10 Essential (primary) hypertension: Secondary | ICD-10-CM | POA: Diagnosis not present

## 2024-02-29 DIAGNOSIS — E063 Autoimmune thyroiditis: Secondary | ICD-10-CM | POA: Diagnosis not present

## 2024-02-29 DIAGNOSIS — E039 Hypothyroidism, unspecified: Secondary | ICD-10-CM | POA: Diagnosis not present

## 2024-03-03 DIAGNOSIS — I10 Essential (primary) hypertension: Secondary | ICD-10-CM | POA: Diagnosis not present

## 2024-03-03 DIAGNOSIS — M5136 Other intervertebral disc degeneration, lumbar region with discogenic back pain only: Secondary | ICD-10-CM | POA: Diagnosis not present

## 2024-03-03 DIAGNOSIS — E1042 Type 1 diabetes mellitus with diabetic polyneuropathy: Secondary | ICD-10-CM | POA: Diagnosis not present

## 2024-03-03 DIAGNOSIS — Z23 Encounter for immunization: Secondary | ICD-10-CM | POA: Diagnosis not present

## 2024-03-03 DIAGNOSIS — Z794 Long term (current) use of insulin: Secondary | ICD-10-CM | POA: Diagnosis not present

## 2024-03-03 DIAGNOSIS — E785 Hyperlipidemia, unspecified: Secondary | ICD-10-CM | POA: Diagnosis not present

## 2024-03-07 DIAGNOSIS — I10 Essential (primary) hypertension: Secondary | ICD-10-CM | POA: Diagnosis not present

## 2024-03-07 DIAGNOSIS — E1042 Type 1 diabetes mellitus with diabetic polyneuropathy: Secondary | ICD-10-CM | POA: Diagnosis not present

## 2024-03-07 DIAGNOSIS — E1065 Type 1 diabetes mellitus with hyperglycemia: Secondary | ICD-10-CM | POA: Diagnosis not present

## 2024-03-25 DIAGNOSIS — I10 Essential (primary) hypertension: Secondary | ICD-10-CM | POA: Diagnosis not present

## 2024-06-03 DIAGNOSIS — E1065 Type 1 diabetes mellitus with hyperglycemia: Secondary | ICD-10-CM | POA: Diagnosis not present

## 2024-06-03 DIAGNOSIS — E039 Hypothyroidism, unspecified: Secondary | ICD-10-CM | POA: Diagnosis not present

## 2024-06-03 DIAGNOSIS — I10 Essential (primary) hypertension: Secondary | ICD-10-CM | POA: Diagnosis not present

## 2024-06-03 DIAGNOSIS — R0683 Snoring: Secondary | ICD-10-CM | POA: Diagnosis not present
